# Patient Record
Sex: Female | Born: 1955 | Race: Black or African American | Hispanic: No | Smoking: Never smoker
Health system: Southern US, Community
[De-identification: ages and names within clinical notes are randomized; demographics above are authoritative.]

## PROBLEM LIST (undated history)

## (undated) DIAGNOSIS — E119 Type 2 diabetes mellitus without complications: Secondary | ICD-10-CM

## (undated) DIAGNOSIS — M1990 Unspecified osteoarthritis, unspecified site: Secondary | ICD-10-CM

## (undated) DIAGNOSIS — K219 Gastro-esophageal reflux disease without esophagitis: Secondary | ICD-10-CM

## (undated) DIAGNOSIS — C801 Malignant (primary) neoplasm, unspecified: Secondary | ICD-10-CM

## (undated) DIAGNOSIS — R0602 Shortness of breath: Secondary | ICD-10-CM

## (undated) DIAGNOSIS — F32A Depression, unspecified: Secondary | ICD-10-CM

## (undated) DIAGNOSIS — I10 Essential (primary) hypertension: Secondary | ICD-10-CM

## (undated) DIAGNOSIS — F319 Bipolar disorder, unspecified: Secondary | ICD-10-CM

## (undated) DIAGNOSIS — Z9289 Personal history of other medical treatment: Secondary | ICD-10-CM

## (undated) DIAGNOSIS — F329 Major depressive disorder, single episode, unspecified: Secondary | ICD-10-CM

## (undated) HISTORY — PX: WRIST SURGERY: SHX841

## (undated) HISTORY — PX: KNEE ARTHROPLASTY: SHX992

## (undated) HISTORY — PX: CHOLECYSTECTOMY: SHX55

## (undated) HISTORY — PX: BACK SURGERY: SHX140

## (undated) HISTORY — PX: THYROID SURGERY: SHX805

## (undated) HISTORY — PX: KNEE ARTHROSCOPY: SHX127

## (undated) HISTORY — PX: BREAST SURGERY: SHX581

## (undated) HISTORY — PX: ECTOPIC PREGNANCY SURGERY: SHX613

---

## 2001-12-19 DIAGNOSIS — I10 Essential (primary) hypertension: Secondary | ICD-10-CM | POA: Insufficient documentation

## 2005-01-03 HISTORY — PX: JOINT REPLACEMENT: SHX530

## 2005-08-11 ENCOUNTER — Emergency Department: Payer: Self-pay | Admitting: Emergency Medicine

## 2007-11-16 ENCOUNTER — Ambulatory Visit: Payer: Self-pay | Admitting: Internal Medicine

## 2008-01-27 ENCOUNTER — Ambulatory Visit: Payer: Self-pay | Admitting: Family Medicine

## 2010-10-08 DIAGNOSIS — F259 Schizoaffective disorder, unspecified: Secondary | ICD-10-CM | POA: Insufficient documentation

## 2011-04-04 DIAGNOSIS — E119 Type 2 diabetes mellitus without complications: Secondary | ICD-10-CM | POA: Insufficient documentation

## 2011-09-21 ENCOUNTER — Ambulatory Visit: Payer: Self-pay | Admitting: Family Medicine

## 2012-05-10 ENCOUNTER — Ambulatory Visit: Payer: Self-pay | Admitting: Pain Medicine

## 2012-05-21 DIAGNOSIS — B351 Tinea unguium: Secondary | ICD-10-CM | POA: Insufficient documentation

## 2012-05-23 ENCOUNTER — Ambulatory Visit: Payer: Self-pay | Admitting: Pain Medicine

## 2012-06-06 ENCOUNTER — Emergency Department: Payer: Self-pay | Admitting: Emergency Medicine

## 2012-06-13 DIAGNOSIS — M810 Age-related osteoporosis without current pathological fracture: Secondary | ICD-10-CM | POA: Insufficient documentation

## 2012-06-19 ENCOUNTER — Ambulatory Visit: Payer: Self-pay | Admitting: Pain Medicine

## 2012-06-20 ENCOUNTER — Other Ambulatory Visit: Payer: Self-pay | Admitting: Neurosurgery

## 2012-06-25 ENCOUNTER — Encounter (HOSPITAL_COMMUNITY): Payer: Self-pay | Admitting: Respiratory Therapy

## 2012-06-28 ENCOUNTER — Encounter (HOSPITAL_COMMUNITY): Payer: Self-pay

## 2012-06-28 ENCOUNTER — Encounter (HOSPITAL_COMMUNITY)
Admission: RE | Admit: 2012-06-28 | Discharge: 2012-06-28 | Disposition: A | Discharge status: Home / Self Care | Payer: Medicare Other | Source: Ambulatory Visit | Attending: Neurosurgery | Admitting: Neurosurgery

## 2012-06-28 DIAGNOSIS — Z01818 Encounter for other preprocedural examination: Secondary | ICD-10-CM | POA: Insufficient documentation

## 2012-06-28 DIAGNOSIS — Z01810 Encounter for preprocedural cardiovascular examination: Secondary | ICD-10-CM | POA: Insufficient documentation

## 2012-06-28 DIAGNOSIS — Z01812 Encounter for preprocedural laboratory examination: Secondary | ICD-10-CM | POA: Insufficient documentation

## 2012-06-28 HISTORY — DX: Depression, unspecified: F32.A

## 2012-06-28 HISTORY — DX: Essential (primary) hypertension: I10

## 2012-06-28 HISTORY — DX: Type 2 diabetes mellitus without complications: E11.9

## 2012-06-28 HISTORY — DX: Personal history of other medical treatment: Z92.89

## 2012-06-28 HISTORY — DX: Gastro-esophageal reflux disease without esophagitis: K21.9

## 2012-06-28 HISTORY — DX: Major depressive disorder, single episode, unspecified: F32.9

## 2012-06-28 LAB — BASIC METABOLIC PANEL
Calcium: 9.5 mg/dL (ref 8.4–10.5)
Creatinine, Ser: 0.82 mg/dL (ref 0.50–1.10)
GFR calc Af Amer: 90 mL/min — ABNORMAL LOW (ref >90)

## 2012-06-28 LAB — SURGICAL PCR SCREEN: MRSA, PCR: NEGATIVE

## 2012-06-28 LAB — CBC
MCH: 29.3 pg (ref 26.0–34.0)
MCV: 88.9 fL (ref 78.0–100.0)
Platelets: 257 10*3/uL (ref 150–400)
RDW: 14.3 % (ref 11.5–15.5)

## 2012-06-28 NOTE — Progress Notes (Addendum)
PATIENT HAD QUESTIONS ABOUT SURGERY AND SHE CALLED OFFICE .  PATIENT WAS GIVEN APPOINTMENT Monday TO TALK WITH DR. Gerlene Fee, WILL NEED TO SIGN CONSENT DOS.  PATIENT STATED DR. KRITZER'S OFFICE DID INSTRUCT HER TO STOP ASPIRIN.

## 2012-06-28 NOTE — Pre-Procedure Instructions (Signed)
Ashlee Bruce  06/28/2012   Your procedure is scheduled on:    Tuesday  07/03/12   Report to [facility] at 945 AM.  Call this number if you have problems the morning of surgery: [phone]   Remember:   Do not eat food or drink liquids after midnight.   Take these medicines the morning of surgery with A SIP OF WATER  :     ZANTAC, OXYCODONE IF NEEDED    Do not wear jewelry, make-up or nail polish.  Do not wear lotions, powders, or perfumes. You may wear deodorant.  Do not shave 48 hours prior to surgery. Men may shave face and neck.  Do not bring valuables to the hospital.  [facility] is not responsible                   for any belongings or valuables.  Contacts, dentures or bridgework may not be worn into surgery.  Leave suitcase in the car. After surgery it may be brought to your room.  For patients admitted to the hospital, checkout time is 11:00 AM the day of  discharge.   Patients discharged the day of surgery will not be allowed to drive  home.  Name and phone number of your driver:   Special Instructions: Shower using CHG 2 nights before surgery and the night before surgery.  If you shower the day of surgery use CHG.  Use special wash - you have one bottle of CHG for all showers.  You should use approximately 1/3 of the bottle for each shower.   Please read over the following fact sheets that you were given: Pain Booklet, Coughing and Deep Breathing, MRSA Information and Surgical Site Infection Prevention

## 2012-07-02 MED ORDER — DEXAMETHASONE SODIUM PHOSPHATE 10 MG/ML IJ SOLN: 10.0000 mg | INTRAMUSCULAR | Status: DC

## 2012-07-02 MED ORDER — CEFAZOLIN SODIUM-DEXTROSE 2-3 GM-% IV SOLR: 2.0000 g | INTRAVENOUS | Status: DC

## 2012-07-03 ENCOUNTER — Encounter (HOSPITAL_COMMUNITY): Payer: Self-pay | Admitting: Anesthesiology

## 2012-07-03 ENCOUNTER — Inpatient Hospital Stay (HOSPITAL_COMMUNITY): Admission: RE | Admit: 2012-07-03 | Payer: Medicare Other | Source: Ambulatory Visit | Admitting: Neurosurgery

## 2012-07-03 ENCOUNTER — Encounter (HOSPITAL_COMMUNITY): Admission: RE | Payer: Self-pay | Source: Ambulatory Visit

## 2012-07-03 SURGERY — ANTERIOR CERVICAL DECOMPRESSION/DISCECTOMY FUSION 2 LEVELS
Anesthesia: General

## 2012-07-03 NOTE — Preoperative (Signed)
Beta Blockers   Reason not to administer Beta Blockers:Not Applicable 

## 2012-07-12 ENCOUNTER — Other Ambulatory Visit: Payer: Self-pay | Admitting: Neurosurgery

## 2012-07-16 ENCOUNTER — Encounter (HOSPITAL_COMMUNITY): Payer: Self-pay | Admitting: Pharmacy Technician

## 2012-07-19 ENCOUNTER — Encounter (HOSPITAL_COMMUNITY)
Admission: RE | Admit: 2012-07-19 | Discharge: 2012-07-19 | Disposition: A | Discharge status: Home / Self Care | Payer: Medicare Other | Source: Ambulatory Visit | Attending: Neurosurgery | Admitting: Neurosurgery

## 2012-07-19 ENCOUNTER — Other Ambulatory Visit (HOSPITAL_COMMUNITY): Payer: Medicare Other

## 2012-07-19 ENCOUNTER — Ambulatory Visit: Payer: Self-pay | Admitting: Pain Medicine

## 2012-07-19 DIAGNOSIS — Z01818 Encounter for other preprocedural examination: Secondary | ICD-10-CM | POA: Insufficient documentation

## 2012-07-19 DIAGNOSIS — Z01812 Encounter for preprocedural laboratory examination: Secondary | ICD-10-CM | POA: Insufficient documentation

## 2012-07-19 LAB — CBC
Hemoglobin: 13.4 g/dL (ref 12.0–15.0)
MCH: 28.8 pg (ref 26.0–34.0)
MCHC: 32.1 g/dL (ref 30.0–36.0)
MCV: 89.7 fL (ref 78.0–100.0)
Platelets: 274 10*3/uL (ref 150–400)

## 2012-07-19 LAB — BASIC METABOLIC PANEL
Calcium: 9.8 mg/dL (ref 8.4–10.5)
Creatinine, Ser: 0.79 mg/dL (ref 0.50–1.10)
GFR calc non Af Amer: 89 mL/min — ABNORMAL LOW (ref >90)
Glucose, Bld: 73 mg/dL (ref 70–99)
Sodium: 141 mEq/L (ref 135–145)

## 2012-07-19 NOTE — Pre-Procedure Instructions (Signed)
Ashlee Bruce  07/19/2012   Your procedure is scheduled on:  07/26/12  Report to [facility] at 930 AM.  Call this number if you have problems the morning of surgery: [phone]   Remember:   Do not eat food or drink liquids after midnight.   Take these medicines the morning of surgery with A SIP OF WATER: oxycodone,zantac   Do not wear jewelry, make-up or nail polish.  Do not wear lotions, powders, or perfumes. You may wear deodorant.  Do not shave 48 hours prior to surgery. Men may shave face and neck.  Do not bring valuables to the hospital.  [facility] is not responsible                   for any belongings or valuables.  Contacts, dentures or bridgework may not be worn into surgery.  Leave suitcase in the car. After surgery it may be brought to your room.  For patients admitted to the hospital, checkout time is 11:00 AM the day of  discharge.   Patients discharged the day of surgery will not be allowed to drive  home.  Name and phone number of your driver: family  Special Instructions: Incentive Spirometry - Practice and bring it with you on the day of surgery.   Please read over the following fact sheets that you were given: Pain Booklet, Coughing and Deep Breathing, MRSA Information and Surgical Site Infection Prevention

## 2012-07-25 MED ORDER — CEFAZOLIN SODIUM-DEXTROSE 2-3 GM-% IV SOLR
2.0000 g | INTRAVENOUS | Status: AC
  Administered 2012-07-26: 2 g via INTRAVENOUS
  Filled 2012-07-25: qty 50

## 2012-07-26 ENCOUNTER — Inpatient Hospital Stay (HOSPITAL_COMMUNITY): Payer: Medicare Other

## 2012-07-26 ENCOUNTER — Inpatient Hospital Stay (HOSPITAL_COMMUNITY)
Admission: RE | Admit: 2012-07-26 | Discharge: 2012-07-27 | DRG: 473 | Disposition: A | Discharge status: Home / Self Care | Payer: Medicare Other | Source: Ambulatory Visit | Attending: Neurosurgery | Admitting: Neurosurgery

## 2012-07-26 ENCOUNTER — Inpatient Hospital Stay (HOSPITAL_COMMUNITY): Payer: Medicare Other | Admitting: Anesthesiology

## 2012-07-26 ENCOUNTER — Encounter (HOSPITAL_COMMUNITY)
Admission: RE | Disposition: A | Discharge status: Home / Self Care | Payer: Self-pay | Source: Ambulatory Visit | Attending: Neurosurgery

## 2012-07-26 ENCOUNTER — Encounter (HOSPITAL_COMMUNITY): Payer: Self-pay | Admitting: Surgery

## 2012-07-26 ENCOUNTER — Encounter (HOSPITAL_COMMUNITY): Payer: Self-pay | Admitting: Anesthesiology

## 2012-07-26 DIAGNOSIS — Z96659 Presence of unspecified artificial knee joint: Secondary | ICD-10-CM

## 2012-07-26 DIAGNOSIS — F3289 Other specified depressive episodes: Secondary | ICD-10-CM | POA: Diagnosis present

## 2012-07-26 DIAGNOSIS — M5020 Other cervical disc displacement, unspecified cervical region: Secondary | ICD-10-CM

## 2012-07-26 DIAGNOSIS — F329 Major depressive disorder, single episode, unspecified: Secondary | ICD-10-CM | POA: Diagnosis present

## 2012-07-26 DIAGNOSIS — K219 Gastro-esophageal reflux disease without esophagitis: Secondary | ICD-10-CM | POA: Diagnosis present

## 2012-07-26 DIAGNOSIS — I10 Essential (primary) hypertension: Secondary | ICD-10-CM | POA: Diagnosis present

## 2012-07-26 DIAGNOSIS — Z79899 Other long term (current) drug therapy: Secondary | ICD-10-CM

## 2012-07-26 DIAGNOSIS — E119 Type 2 diabetes mellitus without complications: Secondary | ICD-10-CM | POA: Diagnosis present

## 2012-07-26 DIAGNOSIS — Z9089 Acquired absence of other organs: Secondary | ICD-10-CM

## 2012-07-26 HISTORY — PX: ANTERIOR CERVICAL DECOMP/DISCECTOMY FUSION: SHX1161

## 2012-07-26 LAB — GLUCOSE, CAPILLARY
Glucose-Capillary: 121 mg/dL — ABNORMAL HIGH (ref 70–99)
Glucose-Capillary: 174 mg/dL — ABNORMAL HIGH (ref 70–99)
Glucose-Capillary: 126 mg/dL — ABNORMAL HIGH (ref 70–99)

## 2012-07-26 SURGERY — ANTERIOR CERVICAL DECOMPRESSION/DISCECTOMY FUSION 2 LEVELS
Anesthesia: General | Wound class: Dirty or Infected

## 2012-07-26 MED ORDER — SODIUM CHLORIDE 0.9 % IV SOLN
INTRAVENOUS | Status: AC
  Filled 2012-07-26: qty 500

## 2012-07-26 MED ORDER — INSULIN ASPART 100 UNIT/ML ~~LOC~~ SOLN: 0.0000 [IU] | Freq: Three times a day (TID) | SUBCUTANEOUS | Status: DC

## 2012-07-26 MED ORDER — PHENOL 1.4 % MT LIQD: 1.0000 | OROMUCOSAL | Status: DC | PRN

## 2012-07-26 MED ORDER — SODIUM CHLORIDE 0.9 % IJ SOLN: 3.0000 mL | INTRAMUSCULAR | Status: DC | PRN

## 2012-07-26 MED ORDER — NEOSTIGMINE METHYLSULFATE 1 MG/ML IJ SOLN
INTRAMUSCULAR | Status: DC | PRN
  Administered 2012-07-26: 3 mg via INTRAVENOUS

## 2012-07-26 MED ORDER — OXYCODONE HCL 5 MG PO TABS
5.0000 mg | ORAL_TABLET | Freq: Once | ORAL | Status: AC | PRN
  Administered 2012-07-26: 5 mg via ORAL

## 2012-07-26 MED ORDER — QUINAPRIL HCL 10 MG PO TABS
20.0000 mg | ORAL_TABLET | ORAL | Status: AC
  Administered 2012-07-26: 20 mg via ORAL
  Filled 2012-07-26: qty 2

## 2012-07-26 MED ORDER — 0.9 % SODIUM CHLORIDE (POUR BTL) OPTIME
TOPICAL | Status: DC | PRN
  Administered 2012-07-26: 1000 mL

## 2012-07-26 MED ORDER — BACITRACIN 50000 UNITS IM SOLR
INTRAMUSCULAR | Status: AC
  Filled 2012-07-26: qty 1

## 2012-07-26 MED ORDER — ACETAMINOPHEN 650 MG RE SUPP: 650.0000 mg | RECTAL | Status: DC | PRN

## 2012-07-26 MED ORDER — LACTATED RINGERS IV SOLN
INTRAVENOUS | Status: DC | PRN
  Administered 2012-07-26 (×2): via INTRAVENOUS

## 2012-07-26 MED ORDER — HYDROMORPHONE HCL PF 1 MG/ML IJ SOLN
1.0000 mg | INTRAMUSCULAR | Status: DC | PRN
  Administered 2012-07-26: 1.5 mg via INTRAMUSCULAR
  Administered 2012-07-27 (×4): 1 mg via INTRAMUSCULAR
  Filled 2012-07-26 (×3): qty 1
  Filled 2012-07-26: qty 2
  Filled 2012-07-26: qty 1

## 2012-07-26 MED ORDER — OXYCODONE HCL 5 MG/5ML PO SOLN: 5.0000 mg | Freq: Once | ORAL | Status: AC | PRN

## 2012-07-26 MED ORDER — HYDROMORPHONE HCL PF 1 MG/ML IJ SOLN
INTRAMUSCULAR | Status: AC
  Filled 2012-07-26: qty 1

## 2012-07-26 MED ORDER — POTASSIUM CHLORIDE IN NACL 20-0.45 MEQ/L-% IV SOLN
INTRAVENOUS | Status: DC
  Filled 2012-07-26 (×4): qty 1000

## 2012-07-26 MED ORDER — SIMVASTATIN 5 MG PO TABS
5.0000 mg | ORAL_TABLET | Freq: Every day | ORAL | Status: DC
  Administered 2012-07-26: 5 mg via ORAL
  Filled 2012-07-26 (×2): qty 1

## 2012-07-26 MED ORDER — SODIUM CHLORIDE 0.9 % IJ SOLN
3.0000 mL | Freq: Two times a day (BID) | INTRAMUSCULAR | Status: DC
  Administered 2012-07-26: 3 mL via INTRAVENOUS

## 2012-07-26 MED ORDER — ONDANSETRON HCL 4 MG/2ML IJ SOLN
4.0000 mg | INTRAMUSCULAR | Status: DC | PRN
  Filled 2012-07-26: qty 2

## 2012-07-26 MED ORDER — FENTANYL CITRATE 0.05 MG/ML IJ SOLN
INTRAMUSCULAR | Status: DC | PRN
  Administered 2012-07-26: 50 ug via INTRAVENOUS
  Administered 2012-07-26: 100 ug via INTRAVENOUS
  Administered 2012-07-26: 50 ug via INTRAVENOUS
  Administered 2012-07-26: 100 ug via INTRAVENOUS

## 2012-07-26 MED ORDER — HYDROCHLOROTHIAZIDE 25 MG PO TABS
25.0000 mg | ORAL_TABLET | Freq: Every day | ORAL | Status: DC
  Administered 2012-07-27: 25 mg via ORAL
  Filled 2012-07-26: qty 1

## 2012-07-26 MED ORDER — PROMETHAZINE HCL 25 MG/ML IJ SOLN: 6.2500 mg | INTRAMUSCULAR | Status: DC | PRN

## 2012-07-26 MED ORDER — LACTATED RINGERS IV SOLN: INTRAVENOUS | Status: DC

## 2012-07-26 MED ORDER — HYDROCHLOROTHIAZIDE 25 MG PO TABS
25.0000 mg | ORAL_TABLET | ORAL | Status: AC
  Administered 2012-07-26: 25 mg via ORAL
  Filled 2012-07-26: qty 1

## 2012-07-26 MED ORDER — THROMBIN 5000 UNITS EX SOLR
CUTANEOUS | Status: DC | PRN
  Administered 2012-07-26 (×2): 5000 [IU] via TOPICAL

## 2012-07-26 MED ORDER — PANTOPRAZOLE SODIUM 40 MG IV SOLR
40.0000 mg | Freq: Every day | INTRAVENOUS | Status: DC
  Administered 2012-07-26: 40 mg via INTRAVENOUS
  Filled 2012-07-26 (×2): qty 40

## 2012-07-26 MED ORDER — ROCURONIUM BROMIDE 100 MG/10ML IV SOLN
INTRAVENOUS | Status: DC | PRN
  Administered 2012-07-26: 50 mg via INTRAVENOUS
  Administered 2012-07-26: 10 mg via INTRAVENOUS

## 2012-07-26 MED ORDER — THROMBIN 5000 UNITS EX SOLR
OROMUCOSAL | Status: DC | PRN
  Administered 2012-07-26: 13:00:00 via TOPICAL

## 2012-07-26 MED ORDER — SODIUM CHLORIDE 0.9 % IR SOLN
Status: DC | PRN
  Administered 2012-07-26: 12:00:00

## 2012-07-26 MED ORDER — OXYCODONE HCL 5 MG PO TABS
ORAL_TABLET | ORAL | Status: AC
  Filled 2012-07-26: qty 1

## 2012-07-26 MED ORDER — HEMOSTATIC AGENTS (NO CHARGE) OPTIME
TOPICAL | Status: DC | PRN
  Administered 2012-07-26: 1 via TOPICAL

## 2012-07-26 MED ORDER — CEFAZOLIN SODIUM 1-5 GM-% IV SOLN
1.0000 g | Freq: Three times a day (TID) | INTRAVENOUS | Status: AC
  Administered 2012-07-26 – 2012-07-27 (×2): 1 g via INTRAVENOUS
  Filled 2012-07-26 (×2): qty 50

## 2012-07-26 MED ORDER — PROPOFOL 10 MG/ML IV BOLUS
INTRAVENOUS | Status: DC | PRN
  Administered 2012-07-26: 170 mg via INTRAVENOUS

## 2012-07-26 MED ORDER — HYDROMORPHONE HCL PF 1 MG/ML IJ SOLN
0.2500 mg | INTRAMUSCULAR | Status: DC | PRN
  Administered 2012-07-26 (×3): 0.5 mg via INTRAVENOUS

## 2012-07-26 MED ORDER — MENTHOL 3 MG MT LOZG: 1.0000 | LOZENGE | OROMUCOSAL | Status: DC | PRN

## 2012-07-26 MED ORDER — ACETAMINOPHEN 325 MG PO TABS: 650.0000 mg | ORAL_TABLET | ORAL | Status: DC | PRN

## 2012-07-26 MED ORDER — GLYCOPYRROLATE 0.2 MG/ML IJ SOLN
INTRAMUSCULAR | Status: DC | PRN
  Administered 2012-07-26: 0.6 mg via INTRAVENOUS

## 2012-07-26 MED ORDER — QUINAPRIL HCL 10 MG PO TABS
20.0000 mg | ORAL_TABLET | Freq: Every day | ORAL | Status: DC
  Filled 2012-07-26: qty 2

## 2012-07-26 MED ORDER — MIDAZOLAM HCL 5 MG/5ML IJ SOLN
INTRAMUSCULAR | Status: DC | PRN
  Administered 2012-07-26: 2 mg via INTRAVENOUS

## 2012-07-26 MED ORDER — INSULIN ASPART 100 UNIT/ML ~~LOC~~ SOLN: 0.0000 [IU] | Freq: Every day | SUBCUTANEOUS | Status: DC

## 2012-07-26 MED ORDER — INSULIN ASPART 100 UNIT/ML ~~LOC~~ SOLN: 4.0000 [IU] | Freq: Three times a day (TID) | SUBCUTANEOUS | Status: DC

## 2012-07-26 MED ORDER — METFORMIN HCL 500 MG PO TABS
1000.0000 mg | ORAL_TABLET | Freq: Every day | ORAL | Status: DC
  Administered 2012-07-27: 1000 mg via ORAL
  Filled 2012-07-26 (×2): qty 2

## 2012-07-26 MED ORDER — LIDOCAINE HCL (CARDIAC) 20 MG/ML IV SOLN
INTRAVENOUS | Status: DC | PRN
  Administered 2012-07-26: 50 mg via INTRAVENOUS

## 2012-07-26 MED ORDER — CYCLOBENZAPRINE HCL 10 MG PO TABS
10.0000 mg | ORAL_TABLET | Freq: Three times a day (TID) | ORAL | Status: DC | PRN
  Administered 2012-07-26: 10 mg via ORAL
  Filled 2012-07-26 (×2): qty 1

## 2012-07-26 MED ORDER — ONDANSETRON HCL 4 MG/2ML IJ SOLN
INTRAMUSCULAR | Status: DC | PRN
  Administered 2012-07-26: 4 mg via INTRAVENOUS

## 2012-07-26 MED ORDER — SODIUM CHLORIDE 0.9 % IV SOLN: 250.0000 mL | INTRAVENOUS | Status: DC

## 2012-07-26 MED ORDER — OXYCODONE-ACETAMINOPHEN 5-325 MG PO TABS
1.0000 | ORAL_TABLET | ORAL | Status: DC | PRN
  Filled 2012-07-26 (×2): qty 2

## 2012-07-26 SURGICAL SUPPLY — 65 items
BAG DECANTER FOR FLEXI CONT (MISCELLANEOUS) ×2 IMPLANT
BENZOIN TINCTURE PRP APPL 2/3 (GAUZE/BANDAGES/DRESSINGS) ×2 IMPLANT
BIT DRILL INVIZIA (BIT) ×1 IMPLANT
BRUSH SCRUB EZ PLAIN DRY (MISCELLANEOUS) ×2 IMPLANT
BUR MATCHSTICK NEURO 3.0 LAGG (BURR) ×2 IMPLANT
CANISTER SUCTION 2500CC (MISCELLANEOUS) ×2 IMPLANT
CLOTH BEACON ORANGE TIMEOUT ST (SAFETY) ×2 IMPLANT
CONT SPEC 4OZ CLIKSEAL STRL BL (MISCELLANEOUS) ×2 IMPLANT
DERMABOND ADVANCED (GAUZE/BANDAGES/DRESSINGS) ×1
DERMABOND ADVANCED .7 DNX12 (GAUZE/BANDAGES/DRESSINGS) ×1 IMPLANT
DRAPE C-ARM 42X72 X-RAY (DRAPES) ×4 IMPLANT
DRAPE LAPAROTOMY 100X72 PEDS (DRAPES) ×2 IMPLANT
DRAPE MICROSCOPE ZEISS OPMI (DRAPES) ×2 IMPLANT
DRAPE SURG 17X23 STRL (DRAPES) ×4 IMPLANT
DRESSING TELFA 8X3 (GAUZE/BANDAGES/DRESSINGS) ×2 IMPLANT
DRILL BIT INVIZIA (BIT) ×2
DURAPREP 6ML APPLICATOR 50/CS (WOUND CARE) ×2 IMPLANT
ELECT COATED BLADE 2.86 ST (ELECTRODE) ×2 IMPLANT
ELECT REM PT RETURN 9FT ADLT (ELECTROSURGICAL) ×2
ELECTRODE REM PT RTRN 9FT ADLT (ELECTROSURGICAL) ×1 IMPLANT
GAUZE SPONGE 4X4 16PLY XRAY LF (GAUZE/BANDAGES/DRESSINGS) IMPLANT
GLOVE BIO SURGEON STRL SZ8 (GLOVE) ×2 IMPLANT
GLOVE BIOGEL PI IND STRL 7.0 (GLOVE) ×1 IMPLANT
GLOVE BIOGEL PI IND STRL 7.5 (GLOVE) ×1 IMPLANT
GLOVE BIOGEL PI INDICATOR 7.0 (GLOVE) ×1
GLOVE BIOGEL PI INDICATOR 7.5 (GLOVE) ×1
GLOVE ECLIPSE 7.5 STRL STRAW (GLOVE) ×6 IMPLANT
GLOVE EXAM NITRILE LRG STRL (GLOVE) IMPLANT
GLOVE EXAM NITRILE MD LF STRL (GLOVE) ×2 IMPLANT
GLOVE EXAM NITRILE XL STR (GLOVE) IMPLANT
GLOVE EXAM NITRILE XS STR PU (GLOVE) IMPLANT
GLOVE SS BIOGEL STRL SZ 6.5 (GLOVE) ×2 IMPLANT
GLOVE SS BIOGEL STRL SZ 8 (GLOVE) ×1 IMPLANT
GLOVE SUPERSENSE BIOGEL SZ 6.5 (GLOVE) ×2
GLOVE SUPERSENSE BIOGEL SZ 8 (GLOVE) ×1
GOWN BRE IMP SLV AUR LG STRL (GOWN DISPOSABLE) ×2 IMPLANT
GOWN BRE IMP SLV AUR XL STRL (GOWN DISPOSABLE) ×6 IMPLANT
GOWN STRL REIN 2XL LVL4 (GOWN DISPOSABLE) IMPLANT
HEAD HALTER (SOFTGOODS) ×2 IMPLANT
HEMOSTAT POWDER SURGIFOAM 1G (HEMOSTASIS) ×2 IMPLANT
INTERBODY TM 11X14X7-7DEG ANG (Metal Cage) ×2 IMPLANT
INTERBODY TM 11X14X8-7DEG ANG (Metal Cage) ×2 IMPLANT
KIT BASIN OR (CUSTOM PROCEDURE TRAY) ×2 IMPLANT
KIT ROOM TURNOVER OR (KITS) ×2 IMPLANT
NEEDLE SPNL 20GX3.5 QUINCKE YW (NEEDLE) ×2 IMPLANT
NS IRRIG 1000ML POUR BTL (IV SOLUTION) ×2 IMPLANT
PACK LAMINECTOMY NEURO (CUSTOM PROCEDURE TRAY) ×2 IMPLANT
PAD ARMBOARD 7.5X6 YLW CONV (MISCELLANEOUS) ×2 IMPLANT
PATTIES SURGICAL .75X.75 (GAUZE/BANDAGES/DRESSINGS) ×2 IMPLANT
PLATE INVIZIA 2 LEV 42MM (Plate) ×2 IMPLANT
PUTTY BONE GRAFT KIT 2.5ML (Bone Implant) ×2 IMPLANT
RUBBERBAND STERILE (MISCELLANEOUS) ×4 IMPLANT
SCREW SD FIXED 12MM (Screw) ×12 IMPLANT
SPONGE GAUZE 4X4 12PLY (GAUZE/BANDAGES/DRESSINGS) ×2 IMPLANT
SPONGE INTESTINAL PEANUT (DISPOSABLE) ×2 IMPLANT
SPONGE SURGIFOAM ABS GEL SZ50 (HEMOSTASIS) ×2 IMPLANT
STRIP CLOSURE SKIN 1/2X4 (GAUZE/BANDAGES/DRESSINGS) ×2 IMPLANT
SUT PDS AB 5-0 P3 18 (SUTURE) ×2 IMPLANT
SUT VIC AB 3-0 CP2 18 (SUTURE) ×4 IMPLANT
SYR 20ML ECCENTRIC (SYRINGE) IMPLANT
TAPE CLOTH SURG 4X10 WHT LF (GAUZE/BANDAGES/DRESSINGS) ×2 IMPLANT
TOWEL OR 17X24 6PK STRL BLUE (TOWEL DISPOSABLE) ×2 IMPLANT
TOWEL OR 17X26 10 PK STRL BLUE (TOWEL DISPOSABLE) ×2 IMPLANT
TRAP SPECIMEN MUCOUS 40CC (MISCELLANEOUS) ×2 IMPLANT
WATER STERILE IRR 1000ML POUR (IV SOLUTION) ×2 IMPLANT

## 2012-07-26 NOTE — OR Nursing (Signed)
Dr Dia Sitter aware of bp down slightly and that she has been sleeping / also has had a dose of accupril and hctz she sis not take before surgery---> per him she can transfer to floor

## 2012-07-26 NOTE — Preoperative (Signed)
Beta Blockers   Reason not to administer Beta Blockers:Not Applicable 

## 2012-07-26 NOTE — H&P (Signed)
Ashlee Bruce is an 57 y.o. female.   Chief Complaint: Right upper extremity pain HPI: The patient is a 57 year old female who was evaluated in the office for neck and right arm pain. She was tried on conservative therapy which gave her no significant improvement. She went imaging studies which show significant disc abnormalities at C5-6 and even worse at C6-7. After finding failing additional conservative therapy the patient requested surgery now comes for a two-level anterior cervical discectomy fusion and plating. Of note is the fact the patient is actually on for surgery about a weeks ago interested natural for surgery because she got cold feet. I've had a long discussion with her regarding the risks and benefits of surgical intervention. The risks discussed include but are not limited to bleeding infection weakness numbness paralysis: Instrumentation nonunion coma hoarseness and death. We have discussed alternative methods of therapy offered risks and benefits of nonintervention. She's had the opportunity to ask numerous questions and appears to understand. With this information in hand she has requested we proceed with surgical intervention.  Past Medical History  Diagnosis Date  . Hypertension   . Depression   . Diabetes mellitus without complication   . GERD (gastroesophageal reflux disease)   . Transfusion history     Past Surgical History  Procedure Laterality Date  . Cholecystectomy    . Breast surgery      LEFT   PRECANCER CELLS REMOVED   2009    . Knee arthroplasty      RIGHT  2007     . Thyroid surgery      NODULE REMOVED   1989   . Ectopic pregnancy surgery      TUBE REMOVED  . Wrist surgery      LEFT     2010    History reviewed. No pertinent family history. Social History:  reports that she has never smoked. She does not have any smokeless tobacco history on file. She reports that she does not drink alcohol or use illicit drugs.  Allergies: No Known  Allergies  Medications Prior to Admission  Medication Sig Dispense Refill  . aspirin 81 MG tablet Take 81 mg by mouth daily.      . cholecalciferol (VITAMIN D) 1000 UNITS tablet Take 1,000 Units by mouth daily.      . hydrochlorothiazide (HYDRODIURIL) 25 MG tablet Take 25 mg by mouth daily.      Marland Kitchen lovastatin (MEVACOR) 10 MG tablet Take 10 mg by mouth at bedtime.      . metFORMIN (GLUCOPHAGE) 1000 MG tablet Take 1,000 mg by mouth daily with breakfast.      . Oxycodone HCl 10 MG TABS Take 10 mg by mouth every 6 (six) hours as needed (for pain).      . quinapril (ACCUPRIL) 20 MG tablet Take 20 mg by mouth at bedtime.      . ranitidine (ZANTAC) 150 MG tablet Take 150 mg by mouth 2 (two) times daily.      . traMADol (ULTRAM) 50 MG tablet Take 50 mg by mouth every 6 (six) hours as needed for pain.        Results for orders placed during the hospital encounter of 07/26/12 (from the past 48 hour(s))  GLUCOSE, CAPILLARY     Status: Abnormal   Collection Time    07/26/12  9:44 AM      Result Value Range   Glucose-Capillary 129 (*) 70 - 99 mg/dL   No results found.  Review  of systems not obtained due to patient factors.  Blood pressure 157/84, temperature 97.8 F (36.6 C), resp. rate 20, SpO2 97.00%.  The patient is awake or and oriented. She is no facial asymmetry. Her gait is nonantalgic. Reflexes are 1+ and equal. She has mild weakness of the right tricep muscle her sensation is intact Assessment/Plan Impression is that of disc herniations at C5-6 and C6-7 with right-sided nerve root compression. The plan is for a two-level anterior cervical discectomy with fusion and plating.  Reinaldo Meeker, MD 07/26/2012, 10:49 AM

## 2012-07-26 NOTE — Op Note (Signed)
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 Localizing fluoroscopy was used prior to incision to identify the appropriate level. Transverse incision was made in the right anterior neck started midline and headed towards the medial aspect of the sternocleidomastoid muscle. The platysma muscle was incised transversely and the natural fascial plane between the strap muscles medially and the sternocleidomastoid laterally was identified and followed down to the anterior aspect the cervical spine. Longus Cole muscles were identified split in the midline to play bilaterally with Kitner section and unipolar coagulation. Self-retaining tract was placed for exposure and x-ray showed approach the appropriate levels. Using a 15 blade the [place] disc at C5-6 and C6-7 was incised. Using pituitary rongeurs and curettes approximately 90% of the disc material was removed at both levels. High-speed drill was used to widen the interspace as and bony shavings were saved for use later in the case. At this time the microscope was draped brought into the field and used for the remainder of the case. Starting at C5-6 the remainder of the disc material down the posterior longitudinal ligament was removed. The was then incised transversely and the cut edges removed a Kerrison punch. Large amounts of bony overgrowth and herniated disc material were identified in the right paramedian region these were removed without difficulty. Generous foraminal decompression was carried out of the C6 nerve roots were well visualized well decompressed. Similar decompression was then  carried out at C6-7 were similar pathology was encountered. Herniated disc towards the right side was identified the C7 nerve roots well visualized well decompressed we are completed. At this time inspection was carried out of both levels for any evidence of residual compression and none could be identified. Irrigation was carried out and any bleeding controlled with upper coagulation Gelfoam and Surgifoam. Measurements were taken and a 7 and an 8 mm lordotic graft were chosen. The canal was filled with a mixture of autologous bone morselized allograft in the spacers were impacted at difficulty. Proximal we showed to be in good position. An appropriately length danger cervical plate was then chosen. Under fluoroscopic guidance drill holes were placed followed by placing of 12 mm screws x6. Locking mechanism was rotated locked position and fossae showed good positioning of the plates screws and plugs. Irrigation was carried out and any bleeding control proper coagulation. The was then closed with inverted Vicryl on the opposite is muscle in the subcuticular layer and Steri-Strips were placed on the skin. Shortness was then applied the patient was extubated and taken to cut them in stable condition.

## 2012-07-26 NOTE — Transfer of Care (Signed)
Immediate Anesthesia Transfer of Care Note  Patient: Ashlee Bruce  Procedure(s) Performed: Procedure(s) with comments: ANTERIOR CERVICAL DECOMPRESSION/DISCECTOMY FUSION 2 LEVELS (N/A) - Cervical Five-Six Cervical Six-Seven  Anterior cervical decompression/diskectomy/fusion/plate (class 4- fly in room)  Patient Location: PACU  Anesthesia Type:General  Level of Consciousness: awake and alert   Airway & Oxygen Therapy: Patient Spontanous Breathing and Patient connected to nasal cannula oxygen  Post-op Assessment: Report given to PACU RN and Post -op Vital signs reviewed and stable  Post vital signs: Reviewed and stable  Complications: No apparent anesthesia complications

## 2012-07-26 NOTE — Progress Notes (Signed)
Dr.Massagee called r/t pt's elevated BP. Orders received to cont to treat pain at this time and reasses BP when pain is better.

## 2012-07-26 NOTE — Anesthesia Preprocedure Evaluation (Addendum)
Anesthesia Evaluation  Patient identified by MRN, date of birth, ID band Patient awake    Reviewed: Allergy & Precautions, H&P , NPO status , Patient's Chart, lab work & pertinent test results  Airway Mallampati: I  Neck ROM: Full    Dental  (+) Teeth Intact and Caps   Pulmonary neg pulmonary ROS,  breath sounds clear to auscultation        Cardiovascular hypertension, Rhythm:Regular Rate:Normal     Neuro/Psych negative neurological ROS     GI/Hepatic GERD-  ,  Endo/Other  diabetes  Renal/GU      Musculoskeletal   Abdominal   Peds  Hematology   Anesthesia Other Findings   Reproductive/Obstetrics                          Anesthesia Physical Anesthesia Plan  ASA: III  Anesthesia Plan: General   Post-op Pain Management:    Induction: Intravenous  Airway Management Planned: Oral ETT  Additional Equipment:   Intra-op Plan:   Post-operative Plan: Extubation in OR  Informed Consent:   Dental advisory given  Plan Discussed with: CRNA and Surgeon  Anesthesia Plan Comments:        Anesthesia Quick Evaluation

## 2012-07-26 NOTE — Progress Notes (Signed)
UR COMPLETED  

## 2012-07-26 NOTE — Anesthesia Postprocedure Evaluation (Signed)
  Anesthesia Post-op Note  Patient: Ashlee Bruce  Procedure(s) Performed: Procedure(s) with comments: ANTERIOR CERVICAL DECOMPRESSION/DISCECTOMY FUSION 2 LEVELS (N/A) - Cervical Five-Six Cervical Six-Seven  Anterior cervical decompression/diskectomy/fusion/plate (class 4- fly in room)  Patient Location: PACU  Anesthesia Type:General  Level of Consciousness: awake  Airway and Oxygen Therapy: Patient Spontanous Breathing  Post-op Pain: mild  Post-op Assessment: Post-op Vital signs reviewed  Post-op Vital Signs: stable  Complications: No apparent anesthesia complications

## 2012-07-27 LAB — GLUCOSE, CAPILLARY: Glucose-Capillary: 156 mg/dL — ABNORMAL HIGH (ref 70–99)

## 2012-07-27 MED ORDER — OXYCODONE HCL 10 MG PO TABS: 10.0000 mg | ORAL_TABLET | ORAL | Status: DC | PRN

## 2012-07-27 NOTE — Progress Notes (Signed)
Pt doing well. Pt given D/C instructions with Rx, verbal understanding given. Pt D/C'd home via wheelchair with family @ 1700 per MD order. Rema Fendt, RN

## 2012-07-27 NOTE — Discharge Summary (Signed)
Physician Discharge Summary  Patient ID: DETTA MELLIN MRN: [id number] DOB/AGE: 08-11-1955 57 y.o.  Admit date: 07/26/2012 Discharge date: 07/27/2012  Admission Diagnoses:  Discharge Diagnoses:  Active Problems:   * No active hospital problems. *   Discharged Condition: good  Hospital Course: Surgery one day for 2 level acdf. Did well No pain post op. Wound fine. Ambulated well. Home pod1, specific instructions given.  Consults: None  Significant Diagnostic Studies: none  Treatments: surgery: C56 C 67 acdf  Discharge Exam: Blood pressure 148/76, pulse 98, temperature 98.8 F (37.1 C), resp. rate 18, SpO2 93.00%. Incision/Wound:clean and dry; no new neuro issues  Disposition: Final discharge disposition not confirmed  Discharge Orders   Future Orders Complete By Expires     Call MD for:  difficulty breathing, headache or visual disturbances  As directed     Call MD for:  hives  As directed     Call MD for:  persistant nausea and vomiting  As directed     Call MD for:  redness, tenderness, or signs of infection (pain, swelling, redness, odor or green/yellow discharge around incision site)  As directed     Call MD for:  severe uncontrolled pain  As directed     Call MD for:  temperature >100.4  As directed     Diet general  As directed     Discharge instructions  As directed     Comments:      Mostly bedrest. Get up 9 or 10 times each day and walk for 15-20 minutes each time. Very little sitting the first week. No riding in the car until your first post op appointment. If you had neck surgery...may shower from the chest down. If you had low back surgery....you may shower with a saran wrap covering over the incision. Take your pain medicine as needed...and other medicines that you are instructed to take. Call for an appointment...[phone].        Medication List    STOP taking these medications       aspirin 81 MG tablet     traMADol 50 MG tablet  Commonly known as:   ULTRAM      TAKE these medications       cholecalciferol 1000 UNITS tablet  Commonly known as:  VITAMIN D  Take 1,000 Units by mouth daily.     hydrochlorothiazide 25 MG tablet  Commonly known as:  HYDRODIURIL  Take 25 mg by mouth daily.     lovastatin 10 MG tablet  Commonly known as:  MEVACOR  Take 10 mg by mouth at bedtime.     metFORMIN 1000 MG tablet  Commonly known as:  GLUCOPHAGE  Take 1,000 mg by mouth daily with breakfast.     Oxycodone HCl 10 MG Tabs  Commonly known as:  ROXICODONE  Take 1 tablet (10 mg total) by mouth every 4 (four) hours as needed for pain.     Oxycodone HCl 10 MG Tabs  Take 10 mg by mouth every 6 (six) hours as needed (for pain).     quinapril 20 MG tablet  Commonly known as:  ACCUPRIL  Take 20 mg by mouth at bedtime.     ranitidine 150 MG tablet  Commonly known as:  ZANTAC  Take 150 mg by mouth 2 (two) times daily.         At home rest most of the time. Get up 9 or 10 times each day and take a 15 or 20 minute  walk. No riding in the car and to your first postoperative appointment. If you have neck surgery you may shower from the chest down starting on the third postoperative day. If you had back surgery he may start showering on the third postoperative day with saran wrap wrapped around your incisional area 3 times. After the shower remove the saran wrap. Take pain medicine as needed and other medications as instructed. Call my office for an appointment.  SignedReinaldo Meeker, MD 07/27/2012, 11:12 AM

## 2012-07-30 ENCOUNTER — Encounter (HOSPITAL_COMMUNITY): Payer: Self-pay | Admitting: Neurosurgery

## 2012-08-16 ENCOUNTER — Ambulatory Visit: Payer: Self-pay | Admitting: Pain Medicine

## 2012-12-16 ENCOUNTER — Emergency Department: Payer: Self-pay | Admitting: Emergency Medicine

## 2013-01-14 ENCOUNTER — Ambulatory Visit: Payer: Self-pay | Admitting: Gastroenterology

## 2013-02-02 ENCOUNTER — Emergency Department: Payer: Self-pay | Admitting: Emergency Medicine

## 2013-03-27 ENCOUNTER — Other Ambulatory Visit: Payer: Self-pay | Admitting: Neurosurgery

## 2013-03-27 DIAGNOSIS — M549 Dorsalgia, unspecified: Secondary | ICD-10-CM

## 2013-04-01 ENCOUNTER — Ambulatory Visit
Admission: RE | Admit: 2013-04-01 | Discharge: 2013-04-01 | Disposition: A | Discharge status: Home / Self Care | Payer: Medicare Other | Source: Ambulatory Visit | Attending: Neurosurgery | Admitting: Neurosurgery

## 2013-04-01 VITALS — BP 124/87 | HR 71

## 2013-04-01 DIAGNOSIS — M549 Dorsalgia, unspecified: Secondary | ICD-10-CM

## 2013-04-01 MED ORDER — OXYCODONE-ACETAMINOPHEN 5-325 MG PO TABS
2.0000 | ORAL_TABLET | Freq: Once | ORAL | Status: AC
  Administered 2013-04-01: 2 via ORAL

## 2013-04-01 MED ORDER — ONDANSETRON HCL 8 MG PO TABS
8.0000 mg | ORAL_TABLET | Freq: Once | ORAL | Status: AC
  Administered 2013-04-01: 8 mg via ORAL

## 2013-04-01 MED ORDER — MEPERIDINE HCL 100 MG/ML IJ SOLN
75.0000 mg | Freq: Once | INTRAMUSCULAR | Status: AC
  Administered 2013-04-01: 75 mg via INTRAMUSCULAR

## 2013-04-01 MED ORDER — DIAZEPAM 5 MG PO TABS: 10.0000 mg | ORAL_TABLET | Freq: Once | ORAL | Status: DC

## 2013-04-01 MED ORDER — IOHEXOL 180 MG/ML  SOLN
15.0000 mL | Freq: Once | INTRAMUSCULAR | Status: AC | PRN
  Administered 2013-04-01: 15 mL via INTRATHECAL

## 2013-04-01 NOTE — Discharge Instructions (Signed)

## 2013-04-05 ENCOUNTER — Other Ambulatory Visit: Payer: Medicare Other

## 2013-04-15 ENCOUNTER — Other Ambulatory Visit: Payer: Self-pay | Admitting: Neurosurgery

## 2013-04-19 ENCOUNTER — Encounter (HOSPITAL_COMMUNITY): Payer: Self-pay

## 2013-04-25 ENCOUNTER — Encounter (HOSPITAL_COMMUNITY): Payer: Self-pay

## 2013-04-25 ENCOUNTER — Encounter (HOSPITAL_COMMUNITY)
Admission: RE | Admit: 2013-04-25 | Discharge: 2013-04-25 | Disposition: A | Discharge status: Home / Self Care | Payer: Medicare Other | Source: Ambulatory Visit | Attending: Neurosurgery | Admitting: Neurosurgery

## 2013-04-25 DIAGNOSIS — Z01812 Encounter for preprocedural laboratory examination: Secondary | ICD-10-CM | POA: Insufficient documentation

## 2013-04-25 HISTORY — DX: Shortness of breath: R06.02

## 2013-04-25 HISTORY — DX: Bipolar disorder, unspecified: F31.9

## 2013-04-25 LAB — SURGICAL PCR SCREEN
MRSA, PCR: NEGATIVE
Staphylococcus aureus: NEGATIVE

## 2013-04-25 LAB — CBC
HCT: 41.1 % (ref 36.0–46.0)
Hemoglobin: 13.1 g/dL (ref 12.0–15.0)
MCH: 29.2 pg (ref 26.0–34.0)
MCHC: 31.9 g/dL (ref 30.0–36.0)
MCV: 91.5 fL (ref 78.0–100.0)
Platelets: 275 10*3/uL (ref 150–400)
RBC: 4.49 MIL/uL (ref 3.87–5.11)
RDW: 14.3 % (ref 11.5–15.5)
WBC: 7.3 10*3/uL (ref 4.0–10.5)

## 2013-04-25 LAB — BASIC METABOLIC PANEL
BUN: 20 mg/dL (ref 6–23)
CO2: 24 mEq/L (ref 19–32)
CREATININE: 0.84 mg/dL (ref 0.50–1.10)
Calcium: 9.4 mg/dL (ref 8.4–10.5)
Chloride: 103 mEq/L (ref 96–112)
GFR calc non Af Amer: 75 mL/min — ABNORMAL LOW (ref >90)
GFR, EST AFRICAN AMERICAN: 86 mL/min — AB (ref >90)
Glucose, Bld: 102 mg/dL — ABNORMAL HIGH (ref 70–99)
Potassium: 3.8 mEq/L (ref 3.7–5.3)
Sodium: 141 mEq/L (ref 137–147)

## 2013-04-25 LAB — TYPE AND SCREEN
ABO/RH(D): B POS
ANTIBODY SCREEN: NEGATIVE

## 2013-04-25 LAB — ABO/RH: ABO/RH(D): B POS

## 2013-04-25 NOTE — Pre-Procedure Instructions (Signed)
Ashlee Bruce  04/25/2013   Your procedure is scheduled on:  Friday, May 1.  Report to [facility], Main Entrance or Entrance "A" at 8:30AM.  Call this number if you have problems the morning of surgery: [phone]   Remember:   Do not eat food or drink liquids after midnight Thursday, April 30.   Take these medicines the morning of surgery with A SIP OF WATER:  ranitidine (ZANTAC).              Take if needed: Oxycodone.               Stop taking Aspirin and Herbal medications ,Vitamins.    Do not wear jewelry, make-up or nail polish.  Do not wear lotions, powders, or perfumes.   Do not shave 48 hours prior to surgery.   Do not bring valuables to the hospital.  [facility] is not responsible   for any belongings or valuables.               Contacts, dentures or bridgework may not be worn into surgery.  Leave suitcase in the car. After surgery it may be brought to your room.  For patients admitted to the hospital, discharge time is determined by your treatment team.              Special Instructions: Review  [facility] - Preparing For Surgery.   Please read over the following fact sheets that you were given: Pain Booklet, Coughing and Deep Breathing, Blood Transfusion Information and Surgical Site Infection Prevention

## 2013-05-02 MED ORDER — CEFAZOLIN SODIUM-DEXTROSE 2-3 GM-% IV SOLR
2.0000 g | INTRAVENOUS | Status: AC
  Administered 2013-05-03: 2 g via INTRAVENOUS
  Filled 2013-05-02: qty 50

## 2013-05-02 MED ORDER — DEXAMETHASONE SODIUM PHOSPHATE 10 MG/ML IJ SOLN
10.0000 mg | INTRAMUSCULAR | Status: AC
  Administered 2013-05-03: 10 mg via INTRAVENOUS
  Filled 2013-05-02: qty 1

## 2013-05-02 NOTE — Progress Notes (Signed)
Time change noted pt called and informed to be here at 0745 tomorrow morning. Teach back complete. Voices understanding.

## 2013-05-03 ENCOUNTER — Inpatient Hospital Stay (HOSPITAL_COMMUNITY)
Admission: RE | Admit: 2013-05-03 | Discharge: 2013-05-06 | DRG: 460 | Disposition: A | Discharge status: Home Health | Payer: Medicare Other | Source: Ambulatory Visit | Attending: Neurosurgery | Admitting: Neurosurgery

## 2013-05-03 ENCOUNTER — Encounter (HOSPITAL_COMMUNITY): Payer: Medicare Other | Admitting: Anesthesiology

## 2013-05-03 ENCOUNTER — Inpatient Hospital Stay (HOSPITAL_COMMUNITY): Payer: Medicare Other

## 2013-05-03 ENCOUNTER — Encounter (HOSPITAL_COMMUNITY)
Admission: RE | Disposition: A | Discharge status: Home Health | Payer: Self-pay | Source: Ambulatory Visit | Attending: Neurosurgery

## 2013-05-03 ENCOUNTER — Inpatient Hospital Stay (HOSPITAL_COMMUNITY): Payer: Medicare Other | Admitting: Anesthesiology

## 2013-05-03 ENCOUNTER — Encounter (HOSPITAL_COMMUNITY): Payer: Self-pay | Admitting: *Deleted

## 2013-05-03 DIAGNOSIS — K219 Gastro-esophageal reflux disease without esophagitis: Secondary | ICD-10-CM | POA: Diagnosis present

## 2013-05-03 DIAGNOSIS — M48061 Spinal stenosis, lumbar region without neurogenic claudication: Secondary | ICD-10-CM | POA: Diagnosis present

## 2013-05-03 DIAGNOSIS — M4316 Spondylolisthesis, lumbar region: Secondary | ICD-10-CM

## 2013-05-03 DIAGNOSIS — Z981 Arthrodesis status: Secondary | ICD-10-CM

## 2013-05-03 DIAGNOSIS — Z79899 Other long term (current) drug therapy: Secondary | ICD-10-CM

## 2013-05-03 DIAGNOSIS — E119 Type 2 diabetes mellitus without complications: Secondary | ICD-10-CM | POA: Diagnosis present

## 2013-05-03 DIAGNOSIS — F3130 Bipolar disorder, current episode depressed, mild or moderate severity, unspecified: Secondary | ICD-10-CM | POA: Diagnosis present

## 2013-05-03 DIAGNOSIS — Z96659 Presence of unspecified artificial knee joint: Secondary | ICD-10-CM

## 2013-05-03 DIAGNOSIS — I10 Essential (primary) hypertension: Secondary | ICD-10-CM | POA: Diagnosis present

## 2013-05-03 DIAGNOSIS — Z7982 Long term (current) use of aspirin: Secondary | ICD-10-CM

## 2013-05-03 DIAGNOSIS — Q762 Congenital spondylolisthesis: Principal | ICD-10-CM

## 2013-05-03 LAB — GLUCOSE, CAPILLARY
GLUCOSE-CAPILLARY: 211 mg/dL — AB (ref 70–99)
Glucose-Capillary: 109 mg/dL — ABNORMAL HIGH (ref 70–99)
Glucose-Capillary: 169 mg/dL — ABNORMAL HIGH (ref 70–99)
Glucose-Capillary: 179 mg/dL — ABNORMAL HIGH (ref 70–99)

## 2013-05-03 SURGERY — POSTERIOR LUMBAR FUSION 1 LEVEL
Anesthesia: General | Site: Back

## 2013-05-03 MED ORDER — FENTANYL CITRATE 0.05 MG/ML IJ SOLN
INTRAMUSCULAR | Status: AC
  Filled 2013-05-03: qty 5

## 2013-05-03 MED ORDER — LACTATED RINGERS IV SOLN
INTRAVENOUS | Status: DC
  Administered 2013-05-03: 08:00:00 via INTRAVENOUS

## 2013-05-03 MED ORDER — METFORMIN HCL 500 MG PO TABS
1000.0000 mg | ORAL_TABLET | Freq: Every day | ORAL | Status: DC
  Administered 2013-05-04 – 2013-05-06 (×3): 1000 mg via ORAL
  Filled 2013-05-03 (×4): qty 2

## 2013-05-03 MED ORDER — METFORMIN HCL 500 MG PO TABS: 1000.0000 mg | ORAL_TABLET | Freq: Every day | ORAL | Status: DC

## 2013-05-03 MED ORDER — INSULIN ASPART 100 UNIT/ML ~~LOC~~ SOLN: 0.0000 [IU] | Freq: Every day | SUBCUTANEOUS | Status: DC

## 2013-05-03 MED ORDER — DOCUSATE SODIUM 100 MG PO CAPS
100.0000 mg | ORAL_CAPSULE | Freq: Two times a day (BID) | ORAL | Status: DC
  Administered 2013-05-03 – 2013-05-06 (×6): 100 mg via ORAL
  Filled 2013-05-03 (×6): qty 1

## 2013-05-03 MED ORDER — MIDAZOLAM HCL 5 MG/5ML IJ SOLN
INTRAMUSCULAR | Status: DC | PRN
  Administered 2013-05-03: 2 mg via INTRAVENOUS

## 2013-05-03 MED ORDER — INSULIN ASPART 100 UNIT/ML ~~LOC~~ SOLN
0.0000 [IU] | Freq: Three times a day (TID) | SUBCUTANEOUS | Status: DC
  Administered 2013-05-03: 5 [IU] via SUBCUTANEOUS
  Administered 2013-05-06: 2 [IU] via SUBCUTANEOUS

## 2013-05-03 MED ORDER — HYDROMORPHONE HCL PF 1 MG/ML IJ SOLN
0.2500 mg | INTRAMUSCULAR | Status: DC | PRN
  Administered 2013-05-03 (×2): 0.5 mg via INTRAVENOUS

## 2013-05-03 MED ORDER — MIDAZOLAM HCL 2 MG/2ML IJ SOLN
INTRAMUSCULAR | Status: AC
Start: 2013-05-03 — End: 2013-05-03
  Filled 2013-05-03: qty 2

## 2013-05-03 MED ORDER — PANTOPRAZOLE SODIUM 40 MG PO TBEC
40.0000 mg | DELAYED_RELEASE_TABLET | Freq: Every day | ORAL | Status: DC
  Administered 2013-05-03 – 2013-05-05 (×3): 40 mg via ORAL
  Filled 2013-05-03 (×3): qty 1

## 2013-05-03 MED ORDER — POTASSIUM CHLORIDE IN NACL 20-0.45 MEQ/L-% IV SOLN
INTRAVENOUS | Status: DC
  Administered 2013-05-03: 18:00:00 via INTRAVENOUS
  Filled 2013-05-03 (×6): qty 1000

## 2013-05-03 MED ORDER — LIDOCAINE HCL (CARDIAC) 20 MG/ML IV SOLN
INTRAVENOUS | Status: AC
  Filled 2013-05-03: qty 5

## 2013-05-03 MED ORDER — ONDANSETRON HCL 4 MG/2ML IJ SOLN
4.0000 mg | INTRAMUSCULAR | Status: DC | PRN
  Administered 2013-05-03: 4 mg via INTRAVENOUS
  Filled 2013-05-03: qty 2

## 2013-05-03 MED ORDER — SODIUM CHLORIDE 0.9 % IR SOLN
Status: DC | PRN
  Administered 2013-05-03 (×2)

## 2013-05-03 MED ORDER — QUETIAPINE FUMARATE 300 MG PO TABS
300.0000 mg | ORAL_TABLET | Freq: Every day | ORAL | Status: DC
  Filled 2013-05-03 (×4): qty 1

## 2013-05-03 MED ORDER — ONDANSETRON HCL 4 MG/2ML IJ SOLN
INTRAMUSCULAR | Status: AC
  Filled 2013-05-03: qty 2

## 2013-05-03 MED ORDER — CYCLOBENZAPRINE HCL 10 MG PO TABS
10.0000 mg | ORAL_TABLET | Freq: Three times a day (TID) | ORAL | Status: DC | PRN
  Administered 2013-05-04 – 2013-05-05 (×2): 10 mg via ORAL
  Filled 2013-05-03 (×2): qty 1

## 2013-05-03 MED ORDER — INSULIN ASPART 100 UNIT/ML ~~LOC~~ SOLN: 4.0000 [IU] | Freq: Three times a day (TID) | SUBCUTANEOUS | Status: DC

## 2013-05-03 MED ORDER — PANTOPRAZOLE SODIUM 40 MG IV SOLR: 40.0000 mg | Freq: Every day | INTRAVENOUS | Status: DC

## 2013-05-03 MED ORDER — ACETAMINOPHEN 325 MG PO TABS: 650.0000 mg | ORAL_TABLET | ORAL | Status: DC | PRN

## 2013-05-03 MED ORDER — DEXAMETHASONE SODIUM PHOSPHATE 4 MG/ML IJ SOLN
INTRAMUSCULAR | Status: AC
  Filled 2013-05-03: qty 2

## 2013-05-03 MED ORDER — ACETAMINOPHEN 650 MG RE SUPP: 650.0000 mg | RECTAL | Status: DC | PRN

## 2013-05-03 MED ORDER — PROPOFOL 10 MG/ML IV BOLUS
INTRAVENOUS | Status: AC
  Filled 2013-05-03: qty 20

## 2013-05-03 MED ORDER — THROMBIN 20000 UNITS EX SOLR
CUTANEOUS | Status: DC | PRN
  Administered 2013-05-03: 11:00:00 via TOPICAL

## 2013-05-03 MED ORDER — SODIUM CHLORIDE 0.9 % IV SOLN: 250.0000 mL | INTRAVENOUS | Status: DC

## 2013-05-03 MED ORDER — DEXAMETHASONE 4 MG PO TABS
4.0000 mg | ORAL_TABLET | Freq: Four times a day (QID) | ORAL | Status: AC
  Administered 2013-05-03: 4 mg via ORAL
  Filled 2013-05-03 (×2): qty 1

## 2013-05-03 MED ORDER — GLYCOPYRROLATE 0.2 MG/ML IJ SOLN
INTRAMUSCULAR | Status: AC
  Filled 2013-05-03: qty 3

## 2013-05-03 MED ORDER — FENTANYL CITRATE 0.05 MG/ML IJ SOLN
INTRAMUSCULAR | Status: AC
Start: 2013-05-03 — End: 2013-05-03
  Filled 2013-05-03: qty 5

## 2013-05-03 MED ORDER — CEFAZOLIN SODIUM-DEXTROSE 2-3 GM-% IV SOLR
2.0000 g | Freq: Three times a day (TID) | INTRAVENOUS | Status: AC
  Administered 2013-05-03 – 2013-05-04 (×2): 2 g via INTRAVENOUS
  Filled 2013-05-03 (×2): qty 50

## 2013-05-03 MED ORDER — METOCLOPRAMIDE HCL 5 MG/ML IJ SOLN: 10.0000 mg | Freq: Once | INTRAMUSCULAR | Status: DC | PRN

## 2013-05-03 MED ORDER — ARTIFICIAL TEARS OP OINT
TOPICAL_OINTMENT | OPHTHALMIC | Status: DC | PRN
  Administered 2013-05-03: 1 via OPHTHALMIC

## 2013-05-03 MED ORDER — METOCLOPRAMIDE HCL 5 MG/ML IJ SOLN
INTRAMUSCULAR | Status: AC
  Filled 2013-05-03: qty 2

## 2013-05-03 MED ORDER — PROPOFOL 10 MG/ML IV BOLUS
INTRAVENOUS | Status: DC | PRN
  Administered 2013-05-03: 80 mg via INTRAVENOUS
  Administered 2013-05-03: 20 mg via INTRAVENOUS
  Administered 2013-05-03: 200 mg via INTRAVENOUS

## 2013-05-03 MED ORDER — NEOSTIGMINE METHYLSULFATE 10 MG/10ML IV SOLN
INTRAVENOUS | Status: DC | PRN
  Administered 2013-05-03: 5 mg via INTRAVENOUS

## 2013-05-03 MED ORDER — ROCURONIUM BROMIDE 100 MG/10ML IV SOLN
INTRAVENOUS | Status: DC | PRN
  Administered 2013-05-03: 50 mg via INTRAVENOUS

## 2013-05-03 MED ORDER — LACTATED RINGERS IV SOLN
INTRAVENOUS | Status: DC | PRN
  Administered 2013-05-03 (×4): via INTRAVENOUS

## 2013-05-03 MED ORDER — DEXAMETHASONE SODIUM PHOSPHATE 4 MG/ML IJ SOLN
4.0000 mg | Freq: Four times a day (QID) | INTRAMUSCULAR | Status: AC
  Administered 2013-05-03: 4 mg via INTRAVENOUS
  Filled 2013-05-03: qty 1

## 2013-05-03 MED ORDER — PHENOL 1.4 % MT LIQD: 1.0000 | OROMUCOSAL | Status: DC | PRN

## 2013-05-03 MED ORDER — INSULIN ASPART 100 UNIT/ML ~~LOC~~ SOLN
SUBCUTANEOUS | Status: AC
  Filled 2013-05-03: qty 1

## 2013-05-03 MED ORDER — SODIUM CHLORIDE 0.9 % IJ SOLN
3.0000 mL | Freq: Two times a day (BID) | INTRAMUSCULAR | Status: DC
  Administered 2013-05-03 – 2013-05-05 (×4): 3 mL via INTRAVENOUS

## 2013-05-03 MED ORDER — MENTHOL 3 MG MT LOZG: 1.0000 | LOZENGE | OROMUCOSAL | Status: DC | PRN

## 2013-05-03 MED ORDER — GLYCOPYRROLATE 0.2 MG/ML IJ SOLN
INTRAMUSCULAR | Status: DC | PRN
  Administered 2013-05-03: 0.6 mg via INTRAVENOUS

## 2013-05-03 MED ORDER — OXYCODONE HCL 5 MG/5ML PO SOLN: 5.0000 mg | Freq: Once | ORAL | Status: DC | PRN

## 2013-05-03 MED ORDER — SODIUM CHLORIDE 0.9 % IJ SOLN: 3.0000 mL | INTRAMUSCULAR | Status: DC | PRN

## 2013-05-03 MED ORDER — VECURONIUM BROMIDE 10 MG IV SOLR
INTRAVENOUS | Status: DC | PRN
  Administered 2013-05-03: 4 mg via INTRAVENOUS
  Administered 2013-05-03: 2 mg via INTRAVENOUS
  Administered 2013-05-03: 6 mg via INTRAVENOUS

## 2013-05-03 MED ORDER — DEXTROSE 5 % IV SOLN
INTRAVENOUS | Status: DC | PRN
Start: 2013-05-03 — End: 2013-05-03
  Administered 2013-05-03: 10:00:00 via INTRAVENOUS

## 2013-05-03 MED ORDER — SIMVASTATIN 5 MG PO TABS
5.0000 mg | ORAL_TABLET | Freq: Every day | ORAL | Status: DC
  Administered 2013-05-03 – 2013-05-05 (×3): 5 mg via ORAL
  Filled 2013-05-03 (×4): qty 1

## 2013-05-03 MED ORDER — ONDANSETRON HCL 4 MG/2ML IJ SOLN
INTRAMUSCULAR | Status: DC | PRN
  Administered 2013-05-03 (×2): 4 mg via INTRAVENOUS

## 2013-05-03 MED ORDER — NEOSTIGMINE METHYLSULFATE 10 MG/10ML IV SOLN
INTRAVENOUS | Status: AC
  Filled 2013-05-03: qty 1

## 2013-05-03 MED ORDER — FENTANYL CITRATE 0.05 MG/ML IJ SOLN
INTRAMUSCULAR | Status: DC | PRN
  Administered 2013-05-03: 50 ug via INTRAVENOUS
  Administered 2013-05-03: 200 ug via INTRAVENOUS
  Administered 2013-05-03 (×6): 50 ug via INTRAVENOUS

## 2013-05-03 MED ORDER — OXYCODONE-ACETAMINOPHEN 5-325 MG PO TABS
1.0000 | ORAL_TABLET | ORAL | Status: DC | PRN
  Administered 2013-05-03: 1 via ORAL
  Administered 2013-05-03 – 2013-05-06 (×10): 2 via ORAL
  Filled 2013-05-03 (×11): qty 2

## 2013-05-03 MED ORDER — VECURONIUM BROMIDE 10 MG IV SOLR
INTRAVENOUS | Status: AC
  Filled 2013-05-03: qty 20

## 2013-05-03 MED ORDER — LIDOCAINE HCL (CARDIAC) 20 MG/ML IV SOLN
INTRAVENOUS | Status: AC
  Filled 2013-05-03: qty 10

## 2013-05-03 MED ORDER — LIDOCAINE HCL (CARDIAC) 20 MG/ML IV SOLN
INTRAVENOUS | Status: DC | PRN
  Administered 2013-05-03: 100 mg via INTRAVENOUS

## 2013-05-03 MED ORDER — METOCLOPRAMIDE HCL 5 MG/ML IJ SOLN
INTRAMUSCULAR | Status: DC | PRN
  Administered 2013-05-03: 10 mg via INTRAVENOUS

## 2013-05-03 MED ORDER — 0.9 % SODIUM CHLORIDE (POUR BTL) OPTIME
TOPICAL | Status: DC | PRN
  Administered 2013-05-03: 1000 mL

## 2013-05-03 MED ORDER — OXYCODONE HCL 5 MG PO TABS: 5.0000 mg | ORAL_TABLET | Freq: Once | ORAL | Status: DC | PRN

## 2013-05-03 MED ORDER — HYDROCHLOROTHIAZIDE 25 MG PO TABS
25.0000 mg | ORAL_TABLET | Freq: Every day | ORAL | Status: DC
  Administered 2013-05-03 – 2013-05-06 (×4): 25 mg via ORAL
  Filled 2013-05-03 (×4): qty 1

## 2013-05-03 MED ORDER — BUPIVACAINE HCL (PF) 0.5 % IJ SOLN
INTRAMUSCULAR | Status: DC | PRN
  Administered 2013-05-03: 20 mL

## 2013-05-03 MED ORDER — HYDROMORPHONE HCL PF 1 MG/ML IJ SOLN
1.0000 mg | INTRAMUSCULAR | Status: DC | PRN
  Administered 2013-05-03: 1.5 mg via INTRAMUSCULAR
  Administered 2013-05-04 (×2): 1 mg via INTRAMUSCULAR
  Administered 2013-05-04: 1.5 mg via INTRAMUSCULAR
  Administered 2013-05-04 – 2013-05-05 (×2): 1 mg via INTRAMUSCULAR
  Administered 2013-05-05: 1.5 mg via INTRAMUSCULAR
  Administered 2013-05-05 – 2013-05-06 (×4): 1 mg via INTRAMUSCULAR
  Filled 2013-05-03 (×2): qty 1
  Filled 2013-05-03: qty 2
  Filled 2013-05-03 (×4): qty 1
  Filled 2013-05-03 (×2): qty 2
  Filled 2013-05-03 (×2): qty 1

## 2013-05-03 MED ORDER — QUINAPRIL HCL 10 MG PO TABS
20.0000 mg | ORAL_TABLET | Freq: Every day | ORAL | Status: DC
  Administered 2013-05-03 – 2013-05-04 (×2): 20 mg via ORAL
  Filled 2013-05-03 (×3): qty 2

## 2013-05-03 MED ORDER — ROCURONIUM BROMIDE 50 MG/5ML IV SOLN
INTRAVENOUS | Status: AC
  Filled 2013-05-03: qty 1

## 2013-05-03 SURGICAL SUPPLY — 75 items
BAG DECANTER FOR FLEXI CONT (MISCELLANEOUS) ×6 IMPLANT
BENZOIN TINCTURE PRP APPL 2/3 (GAUZE/BANDAGES/DRESSINGS) ×6 IMPLANT
BLADE 10 SAFETY STRL DISP (BLADE) ×3 IMPLANT
BLADE SURG ROTATE 9660 (MISCELLANEOUS) IMPLANT
BONE EQUIVA 10CC (Bone Implant) ×3 IMPLANT
BRUSH SCRUB EZ PLAIN DRY (MISCELLANEOUS) ×3 IMPLANT
BUR CUTTER 7.0 ROUND (BURR) ×3 IMPLANT
BUR MATCHSTICK NEURO 3.0 LAGG (BURR) ×3 IMPLANT
CAGE PEEK OPTIMA ARDIS 11X9X26 (Cage) ×6 IMPLANT
CANISTER SUCT 3000ML (MISCELLANEOUS) ×3 IMPLANT
CLOSURE WOUND 1/2 X4 (GAUZE/BANDAGES/DRESSINGS) ×2
CONT SPEC 4OZ CLIKSEAL STRL BL (MISCELLANEOUS) ×6 IMPLANT
COVER BACK TABLE 24X17X13 BIG (DRAPES) IMPLANT
COVER TABLE BACK 60X90 (DRAPES) ×3 IMPLANT
DERMABOND ADVANCED (GAUZE/BANDAGES/DRESSINGS)
DERMABOND ADVANCED .7 DNX12 (GAUZE/BANDAGES/DRESSINGS) IMPLANT
DILATOR NON-RADIOLUCENT CANN (MISCELLANEOUS) ×3 IMPLANT
DRAPE C-ARM 42X72 X-RAY (DRAPES) ×6 IMPLANT
DRAPE C-ARMOR (DRAPES) ×3 IMPLANT
DRAPE LAPAROTOMY 100X72X124 (DRAPES) ×3 IMPLANT
DRAPE SURG 17X23 STRL (DRAPES) ×6 IMPLANT
DRESSING TELFA 8X3 (GAUZE/BANDAGES/DRESSINGS) IMPLANT
DRSG OPSITE POSTOP 4X6 (GAUZE/BANDAGES/DRESSINGS) ×6 IMPLANT
DURAPREP 26ML APPLICATOR (WOUND CARE) ×3 IMPLANT
ELECT REM PT RETURN 9FT ADLT (ELECTROSURGICAL) ×3
ELECTRODE REM PT RTRN 9FT ADLT (ELECTROSURGICAL) ×1 IMPLANT
EVACUATOR 1/8 PVC DRAIN (DRAIN) IMPLANT
EVACUATOR 3/16  PVC DRAIN (DRAIN) ×2
EVACUATOR 3/16 PVC DRAIN (DRAIN) ×1 IMPLANT
GAUZE SPONGE 4X4 16PLY XRAY LF (GAUZE/BANDAGES/DRESSINGS) IMPLANT
GLOVE BIO SURGEON STRL SZ 6.5 (GLOVE) ×4 IMPLANT
GLOVE BIO SURGEON STRL SZ8 (GLOVE) ×6 IMPLANT
GLOVE BIO SURGEONS STRL SZ 6.5 (GLOVE) ×2
GLOVE BIOGEL PI IND STRL 7.0 (GLOVE) ×1 IMPLANT
GLOVE BIOGEL PI INDICATOR 7.0 (GLOVE) ×2
GLOVE ECLIPSE 8.0 STRL XLNG CF (GLOVE) ×6 IMPLANT
GLOVE EXAM NITRILE LRG STRL (GLOVE) IMPLANT
GLOVE EXAM NITRILE MD LF STRL (GLOVE) IMPLANT
GLOVE EXAM NITRILE XS STR PU (GLOVE) IMPLANT
GOWN BRE IMP SLV AUR LG STRL (GOWN DISPOSABLE) IMPLANT
GOWN BRE IMP SLV AUR XL STRL (GOWN DISPOSABLE) IMPLANT
GOWN STRL REIN 2XL LVL4 (GOWN DISPOSABLE) IMPLANT
GOWN STRL REUS W/ TWL LRG LVL3 (GOWN DISPOSABLE) ×1 IMPLANT
GOWN STRL REUS W/ TWL XL LVL3 (GOWN DISPOSABLE) ×2 IMPLANT
GOWN STRL REUS W/TWL 2XL LVL3 (GOWN DISPOSABLE) ×3 IMPLANT
GOWN STRL REUS W/TWL LRG LVL3 (GOWN DISPOSABLE) ×2
GOWN STRL REUS W/TWL XL LVL3 (GOWN DISPOSABLE) ×4
K-WIRE NITHNOL TROCAR TIP (WIRE) ×15 IMPLANT
KIT BASIN OR (CUSTOM PROCEDURE TRAY) ×3 IMPLANT
KIT ROOM TURNOVER OR (KITS) ×3 IMPLANT
NEEDLE HYPO 22GX1.5 SAFETY (NEEDLE) ×3 IMPLANT
NEEDLE TARGETING (NEEDLE) ×12 IMPLANT
NS IRRIG 1000ML POUR BTL (IV SOLUTION) ×3 IMPLANT
PACK LAMINECTOMY NEURO (CUSTOM PROCEDURE TRAY) ×3 IMPLANT
PAD ARMBOARD 7.5X6 YLW CONV (MISCELLANEOUS) ×9 IMPLANT
PATTIES SURGICAL .75X.75 (GAUZE/BANDAGES/DRESSINGS) IMPLANT
ROD BENT PERC 35MM (Rod) ×6 IMPLANT
SCREW MIN INVASIVE 6.5X45 (Screw) ×6 IMPLANT
SCREW POLYAXIA MIS 6.5X40MM (Screw) ×6 IMPLANT
SPONGE GAUZE 4X4 12PLY (GAUZE/BANDAGES/DRESSINGS) ×3 IMPLANT
SPONGE LAP 4X18 X RAY DECT (DISPOSABLE) IMPLANT
SPONGE SURGIFOAM ABS GEL 100 (HEMOSTASIS) ×3 IMPLANT
STRIP CLOSURE SKIN 1/2X4 (GAUZE/BANDAGES/DRESSINGS) ×4 IMPLANT
SUT PROLENE 0 CT 1 30 (SUTURE) IMPLANT
SUT VIC AB 0 CT1 18XCR BRD8 (SUTURE) ×2 IMPLANT
SUT VIC AB 0 CT1 8-18 (SUTURE) ×4
SUT VIC AB 2-0 OS6 18 (SUTURE) ×9 IMPLANT
SUT VIC AB 3-0 CP2 18 (SUTURE) ×3 IMPLANT
SYR 20ML ECCENTRIC (SYRINGE) ×3 IMPLANT
TOP CLSR SEQUOIA (Orthopedic Implant) ×12 IMPLANT
TOWEL OR 17X24 6PK STRL BLUE (TOWEL DISPOSABLE) ×3 IMPLANT
TOWEL OR 17X26 10 PK STRL BLUE (TOWEL DISPOSABLE) ×3 IMPLANT
TRAP SPECIMEN MUCOUS 40CC (MISCELLANEOUS) ×3 IMPLANT
TRAY FOLEY CATH 14FRSI W/METER (CATHETERS) ×3 IMPLANT
WATER STERILE IRR 1000ML POUR (IV SOLUTION) ×3 IMPLANT

## 2013-05-03 NOTE — Anesthesia Preprocedure Evaluation (Signed)
Anesthesia Evaluation  Patient identified by MRN, date of birth, ID band Patient awake    Reviewed: Allergy & Precautions, H&P , NPO status , Patient's Chart, lab work & pertinent test results, reviewed documented beta blocker date and time   Airway Mallampati: II TM Distance: >3 FB Neck ROM: full    Dental   Pulmonary neg pulmonary ROS, shortness of breath,  breath sounds clear to auscultation        Cardiovascular hypertension, Pt. on medications negative cardio ROS  Rhythm:regular     Neuro/Psych PSYCHIATRIC DISORDERS Depression Bipolar Disorder negative neurological ROS  negative psych ROS   GI/Hepatic negative GI ROS, Neg liver ROS, GERD-  Medicated and Controlled,  Endo/Other  negative endocrine ROSdiabetes, Oral Hypoglycemic Agents  Renal/GU negative Renal ROS  negative genitourinary   Musculoskeletal   Abdominal   Peds  Hematology negative hematology ROS (+)   Anesthesia Other Findings See surgeon's H&P   Reproductive/Obstetrics negative OB ROS                           Anesthesia Physical Anesthesia Plan  ASA: II  Anesthesia Plan: General   Post-op Pain Management:    Induction: Intravenous  Airway Management Planned: Oral ETT  Additional Equipment:   Intra-op Plan:   Post-operative Plan: Extubation in OR  Informed Consent: I have reviewed the patients History and Physical, chart, labs and discussed the procedure including the risks, benefits and alternatives for the proposed anesthesia with the patient or authorized representative who has indicated his/her understanding and acceptance.   Dental Advisory Given  Plan Discussed with: CRNA and Surgeon  Anesthesia Plan Comments:         Anesthesia Quick Evaluation

## 2013-05-03 NOTE — Op Note (Signed)
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 Localizing fluoroscopy was used prior to incision to identify the appropriate level. Midline incision was made above the spinous processes of L4 and L5. Using Bovie cutting current the incision was carried on the spinous processes. We then dissected the plane between the subcutaneous fat and the fascia. We then opened the fascia on the spinous processes of L4 and L5 bilaterally and expose the lamina and facet joints. Self-retaining tract was placed for exposure and x-ray showed approach the appropriate level. Spinous processes of L4 and L5 were removed. Generous bilateral laminectomy was then performed removing the inferior 80% of the L4 lamina the medial three quarters of the facet joints bilaterally and the superior one third of the L5 lamina. Residual bone was removed and saved for use later in the case ligamentum flavum was removed in a piecemeal fashion. At this time bilateral discectomy was carried out at L4-5. Thorough displaced cleanout was carried out with the same time great care was taken to avoid injury to the neural elements this was successfully done. At this point we prepared the interspace for interbody fusion. We did a variety of techniques to clean up the endplates and prepare them for a fusion. We then packed to 11 x 9 x 26 mm cages with a mixture of autologous bone morselized allograft. The  cages were then impacted at difficulty. Prior to placing the second cage we placed a mixture of autologous bone morselized allograft it within the interspace to help with interbody fusion. The cages were followed in excellent position under fluoroscopy. At this time we irrigated copiously controlled any bleeding with upper coagulation Gelfoam. We then closed the midline fascia with interrupted 0 Vicryl. We then placed percutaneous pedicle screws. We passed the Jamshidi needle from the fascia starting the pedicle from lateral to medial direction. We placed a guidewire through the Jamshidi needles remove the needles. We then connected the fascia between the 2 needles on each side. Then did sequential dilation through the muscles. We tapped with the 6.0 mm tap and placed 6.5 x 40 mm screws at L4 and 6.5 x 40 mm 5 mm screws at L5. We then connected the 2 fascial openings past appropriate length rods down the [place]. We secured the rods to the top of the screws with top loading nuts and did tightening and final tightening with torque and counter torque. We then removed the [facility]. Final x-ray looked good in AP lateral direction. We irrigated these wounds and closed within the ruptured 0 Vicryl and then closed the wound with multiple layers of Vicryl and a running locking Prolene on the skin. Shortness was then applied the patient was extubated and taken to recovery room in stable condition.

## 2013-05-03 NOTE — Transfer of Care (Signed)
Immediate Anesthesia Transfer of Care Note  Patient: Ashlee Bruce  Procedure(s) Performed: Procedure(s) with comments: POSTERIOR LUMBAR FUSION Lumbar Four-Five (N/A) - POSTERIOR LUMBAR FUSION Lumbar Four-Five  Patient Location: PACU  Anesthesia Type:General  Level of Consciousness: oriented, sedated, patient cooperative and responds to stimulation  Airway & Oxygen Therapy: Patient Spontanous Breathing and Patient connected to nasal cannula oxygen  Post-op Assessment: Report given to PACU RN, Post -op Vital signs reviewed and stable, Patient moving all extremities and Patient moving all extremities X 4  Post vital signs: Reviewed and stable  Complications: No apparent anesthesia complications

## 2013-05-03 NOTE — H&P (Signed)
Ashlee Bruce is an 58 y.o. female.   Chief Complaint: Back and lower extremity pain HPI: The patient is a 58 year old female who initially presented with both neck and arm pain as well as chronic low back pain with radiation to lower extremities. She underwent an anterior cervical discectomy initially when she did very well from. She had marked improvement in her upper extremity symptoms. She then returns complaining of increasing pain in her legs once again particularly on the right side. She was reevaluated underwent myelography with postcontrast CT which showed a grade 1 spondylolisthesis at L4-5 which was the third of the disc space from the bottom. This was mobile on flexion-extension there is a diffuse disc herniation. After discussing the options and failing additional conservative therapy the patient requested surgery and now comes for a posterior lumbar interbody fusion with pedicle screw fixation. I have had a long discussion with her regarding the risks and benefits of surgical intervention. The risks discussed include but are not limited to bleeding infection weakness numbness paralysis trouble with instrumentation nonunion spinal fluid leakage coma and death. We have discussed alternative methods of therapy although risks and benefits of nonintervention. She's had the opportunity to ask numerous questions and appears to understand. With this information in hand she has requested we proceed with surgery.  Past Medical History  Diagnosis Date  . Hypertension   . Depression   . Diabetes mellitus without complication   . GERD (gastroesophageal reflux disease)   . Transfusion history     with knee replacement  . Shortness of breath     with exertion  . Bipolar disorder     Past Surgical History  Procedure Laterality Date  . Cholecystectomy    . Breast surgery      LEFT   PRECANCER CELLS REMOVED   2009    . Knee arthroplasty      RIGHT  2007     . Thyroid surgery      NODULE  REMOVED   1989   . Ectopic pregnancy surgery      TUBE REMOVED  . Wrist surgery      LEFT     2010- fracture  . Anterior cervical decomp/discectomy fusion N/A 07/26/2012    Procedure: ANTERIOR CERVICAL DECOMPRESSION/DISCECTOMY FUSION 2 LEVELS;  Surgeon: Faythe Ghee, MD;  Location: [facility];  Service: Neurosurgery;  Laterality: N/A;  Cervical Five-Six Cervical Six-Seven  Anterior cervical decompression/diskectomy/fusion/plate (class 4- fly in room)  . Joint replacement Right 2007  . Knee arthroscopy      History reviewed. No pertinent family history. Social History:  reports that she has never smoked. She does not have any smokeless tobacco history on file. She reports that she drinks about .6 ounces of alcohol per week. She reports that she does not use illicit drugs.  Allergies: No Known Allergies  Medications Prior to Admission  Medication Sig Dispense Refill  . Artificial Tear Ointment (DRY EYES OP) Place 1 drop into both eyes as needed (dry eyes).      Marland Kitchen aspirin 81 MG tablet Take 81 mg by mouth daily.      . cholecalciferol (VITAMIN D) 1000 UNITS tablet Take 2,000 Units by mouth daily.       . hydrochlorothiazide (HYDRODIURIL) 25 MG tablet Take 25 mg by mouth daily.      Marland Kitchen lovastatin (MEVACOR) 10 MG tablet Take 10 mg by mouth at bedtime.      . metFORMIN (GLUCOPHAGE) 1000 MG tablet Take  1,000 mg by mouth daily with breakfast.      . Oxycodone HCl 10 MG TABS Take 10 mg by mouth every 6 (six) hours as needed (for pain).      . QUEtiapine (SEROQUEL) 300 MG tablet Take 300 mg by mouth at bedtime.      . quinapril (ACCUPRIL) 20 MG tablet Take 20 mg by mouth at bedtime.      . ranitidine (ZANTAC) 150 MG tablet Take 150 mg by mouth 2 (two) times daily.        Results for orders placed during the hospital encounter of 05/03/13 (from the past 48 hour(s))  GLUCOSE, CAPILLARY     Status: Abnormal   Collection Time    05/03/13  7:49 AM      Result Value Ref Range   Glucose-Capillary  109 (*) 70 - 99 mg/dL   No results found.  Positive for arthritis and diabetes  Blood pressure 171/91, pulse 91, temperature 98 F (36.7 C), temperature source Oral, resp. rate 18, height 5\' 8"  (1.727 m), weight 88.451 kg (195 lb), SpO2 100.00%.  The patient is awake or and oriented. She is no facial asymmetry. Her gait is slow mildly antalgic. Reflexes are decreased but equal. Strength is intact. Assessment/Plan Impression is that of listhesis which is mobile on flexion-extension with stenosis at L4-5. The plan is for interbody fusion with pedicle screw fixation.  Faythe Ghee, MD 05/03/2013, 9:29 AM

## 2013-05-03 NOTE — Anesthesia Procedure Notes (Signed)
Procedure Name: Intubation Date/Time: 05/03/2013 10:24 AM Performed by: Jacquiline Doe A Pre-anesthesia Checklist: Patient identified, Timeout performed, Emergency Drugs available, Suction available and Patient being monitored Patient Re-evaluated:Patient Re-evaluated prior to inductionOxygen Delivery Method: Circle system utilized Intubation Type: IV induction and Cricoid Pressure applied Ventilation: Mask ventilation without difficulty and Oral airway inserted - appropriate to patient size Laryngoscope Size: Mac and 3 Grade View: Grade I Tube type: Oral Tube size: 8.0 mm Number of attempts: 1 Airway Equipment and Method: Stylet Placement Confirmation: ETT inserted through vocal cords under direct vision,  breath sounds checked- equal and bilateral and positive ETCO2 Secured at: 22 cm Tube secured with: Tape Dental Injury: Teeth and Oropharynx as per pre-operative assessment

## 2013-05-03 NOTE — Anesthesia Postprocedure Evaluation (Signed)
Anesthesia Post Note  Patient: Ashlee Bruce  Procedure(s) Performed: Procedure(s) (LRB): POSTERIOR LUMBAR FUSION Lumbar Four-Five (N/A)  Anesthesia type: General  Patient location: PACU  Post pain: Pain level controlled  Post assessment: Patient's Cardiovascular Status Stable  Last Vitals:  Filed Vitals:   05/03/13 1545  BP: 137/83  Pulse: 77  Temp:   Resp: 17    Post vital signs: Reviewed and stable  Level of consciousness: alert  Complications: No apparent anesthesia complications

## 2013-05-04 LAB — GLUCOSE, CAPILLARY
Glucose-Capillary: 245 mg/dL — ABNORMAL HIGH (ref 70–99)
Glucose-Capillary: 145 mg/dL — ABNORMAL HIGH (ref 70–99)
Glucose-Capillary: 108 mg/dL — ABNORMAL HIGH (ref 70–99)
Glucose-Capillary: 128 mg/dL — ABNORMAL HIGH (ref 70–99)

## 2013-05-04 NOTE — Progress Notes (Signed)
No issues overnight. Pt reports back soreness, no leg pain which she had preop. Ambulating, tolerating diet.  EXAM:  BP 116/83  Pulse 87  Temp(Src) 98.2 F (36.8 C) (Oral)  Resp 20  Ht 5\' 8"  (1.727 m)  Wt 88.451 kg (195 lb)  BMI 29.66 kg/m2  SpO2 93%  Awake, alert, oriented  Speech fluent, appropriate  CN grossly intact  5/5 BUE/BLE  Wound c/d/i  IMPRESSION:  58 y.o. female POD#1 s/p L4-5 PLIF, doing well  PLAN: - Cont to mobilize.  - Pt prefers to go home tomorrow or [place] so family is available for help.

## 2013-05-05 LAB — GLUCOSE, CAPILLARY
GLUCOSE-CAPILLARY: 127 mg/dL — AB (ref 70–99)
GLUCOSE-CAPILLARY: 82 mg/dL (ref 70–99)
GLUCOSE-CAPILLARY: 153 mg/dL — AB (ref 70–99)
Glucose-Capillary: 121 mg/dL — ABNORMAL HIGH (ref 70–99)

## 2013-05-05 MED ORDER — QUINAPRIL HCL 10 MG PO TABS
20.0000 mg | ORAL_TABLET | Freq: Every day | ORAL | Status: DC
  Administered 2013-05-05 – 2013-05-06 (×2): 20 mg via ORAL
  Filled 2013-05-05 (×2): qty 2

## 2013-05-05 NOTE — Progress Notes (Signed)
Patient ID: Ashlee Bruce, female   DOB: [birth year], 58 y.o.   MRN: [id number] Subjective: Patient reports she is doing fairly well. She has pain in her back and some burning around the incision. Her legs are doing great.  Objective: Vital signs in last 24 hours: Temp:  [98.1 F (36.7 C)-99.3 F (37.4 C)] 99.1 F (37.3 C) (05/03 0605) Pulse Rate:  [87-92] 92 (05/03 0605) Resp:  [20] 20 (05/03 0605) BP: (103-136)/(59-87) 104/72 mmHg (05/03 0605) SpO2:  [92 %-97 %] 97 % (05/03 0605)  Intake/Output from previous day: 05/02 0701 - 05/03 0700 In: 1245 [P.O.:1090; I.V.:300] Out: 250 [Urine:250] Intake/Output this shift: Total I/O In: 240 [P.O.:240] Out: -   Neurologic: Grossly normal  Lab Results: Lab Results  Component Value Date   WBC 7.3 04/25/2013   HGB 13.1 04/25/2013   HCT 41.1 04/25/2013   MCV 91.5 04/25/2013   PLT 275 04/25/2013   No results found for this basename: INR, PROTIME   BMET Lab Results  Component Value Date   NA 141 04/25/2013   K 3.8 04/25/2013   CL 103 04/25/2013   CO2 24 04/25/2013   GLUCOSE 102* 04/25/2013   BUN 20 04/25/2013   CREATININE 0.84 04/25/2013   CALCIUM 9.4 04/25/2013    Studies/Results: Dg Lumbar Spine 2-3 Views  05/03/2013   CLINICAL DATA:  L4-L5 PLIF  EXAM: DG C-ARM 61-120 MIN; LUMBAR SPINE - 2-3 VIEW  FLUOROSCOPY TIME:  2 min, 13 seconds  COMPARISON:  Lumbar spine CT -04/01/2013  FINDINGS: Two spot intraoperative fluoroscopic images of the lower lumbar spine a provided for review.  Spinal labeling is in keeping with preprocedural lumbar spine CT at which time 6 lumbar type vertebral bodies were noted to be present with transitional level again labeled S1.  Images demonstrate the sequela of L4-L5 paraspinal fusion intervertebral disc space replacement. There has been interval restoration of the L4-L5 intervertebral disc space height. Alignment appears near anatomic. No radiopaque foreign body.  IMPRESSION: Post L4-L5 paraspinal fusion  intervertebral disc space replacement without evidence of complication.   Electronically Signed   By: Sandi Mariscal M.D.   On: 05/03/2013 14:42   Dg C-arm 61-120 Min  05/03/2013   CLINICAL DATA:  L4-L5 PLIF  EXAM: DG C-ARM 61-120 MIN; LUMBAR SPINE - 2-3 VIEW  FLUOROSCOPY TIME:  2 min, 13 seconds  COMPARISON:  Lumbar spine CT -04/01/2013  FINDINGS: Two spot intraoperative fluoroscopic images of the lower lumbar spine a provided for review.  Spinal labeling is in keeping with preprocedural lumbar spine CT at which time 6 lumbar type vertebral bodies were noted to be present with transitional level again labeled S1.  Images demonstrate the sequela of L4-L5 paraspinal fusion intervertebral disc space replacement. There has been interval restoration of the L4-L5 intervertebral disc space height. Alignment appears near anatomic. No radiopaque foreign body.  IMPRESSION: Post L4-L5 paraspinal fusion intervertebral disc space replacement without evidence of complication.   Electronically Signed   By: Sandi Mariscal M.D.   On: 05/03/2013 14:42    Assessment/Plan:  she seems to be doing fairly well other than appropriate back soreness. She wants another day in the hospital because of her back pain. I don't think that's unreasonable.   LOS: 2 days    Eustace Moore 05/05/2013, 9:30 AM

## 2013-05-06 LAB — GLUCOSE, CAPILLARY
Glucose-Capillary: 125 mg/dL — ABNORMAL HIGH (ref 70–99)
Glucose-Capillary: 76 mg/dL (ref 70–99)

## 2013-05-06 MED ORDER — OXYCODONE HCL 10 MG PO TABS: 10.0000 mg | ORAL_TABLET | ORAL | Status: DC | PRN

## 2013-05-06 NOTE — Progress Notes (Signed)
Discharge instructions given. Pt verbalized understanding and all questions were answered.  

## 2013-05-06 NOTE — Discharge Summary (Signed)
  Physician Discharge Summary  Patient ID: Ashlee Bruce MRN: [id number] DOB/AGE: 06-20-1955 58 y.o.  Admit date: 05/03/2013 Discharge date: 05/06/2013  Admission Diagnoses:  Discharge Diagnoses:  Active Problems:   Spondylolisthesis at L4-L5 level   Discharged Condition: good  Hospital Course: Surgery 3 days ago for mobile listhesis at L 45. Did very well. Slow to increase activity. On pod 3, doing well. Wound healing well. Ambulating without difficulty. Home with specific instructions given.  Consults: None  Significant Diagnostic Studies: None  Treatments: surgery: L 45 plif with pedicle screws  Discharge Exam: Blood pressure 128/77, pulse 90, temperature 98.8 F (37.1 C), temperature source Oral, resp. rate 18, height 5\' 8"  (1.727 m), weight 88.451 kg (195 lb), SpO2 96.00%. Incision/Wound:clean and dry  Disposition: 01-Home or Self Care     Medication List    ASK your doctor about these medications       aspirin 81 MG tablet  Take 81 mg by mouth daily.     cholecalciferol 1000 UNITS tablet  Commonly known as:  VITAMIN D  Take 2,000 Units by mouth daily.     DRY EYES OP  Place 1 drop into both eyes as needed (dry eyes).     hydrochlorothiazide 25 MG tablet  Commonly known as:  HYDRODIURIL  Take 25 mg by mouth daily.     lovastatin 10 MG tablet  Commonly known as:  MEVACOR  Take 10 mg by mouth at bedtime.     metFORMIN 1000 MG tablet  Commonly known as:  GLUCOPHAGE  Take 1,000 mg by mouth daily with breakfast.     Oxycodone HCl 10 MG Tabs  Take 10 mg by mouth every 6 (six) hours as needed (for pain).     QUEtiapine 300 MG tablet  Commonly known as:  SEROQUEL  Take 300 mg by mouth at bedtime.     quinapril 20 MG tablet  Commonly known as:  ACCUPRIL  Take 20 mg by mouth at bedtime.     ranitidine 150 MG tablet  Commonly known as:  ZANTAC  Take 150 mg by mouth 2 (two) times daily.         At home rest most of the time. Get up 9 or 10 times  each day and take a 15 or 20 minute walk. No riding in the car and to your first postoperative appointment. If you have neck surgery you may shower from the chest down starting on the third postoperative day. If you had back surgery he may start showering on the third postoperative day with saran wrap wrapped around your incisional area 3 times. After the shower remove the saran wrap. Take pain medicine as needed and other medications as instructed. Call my office for an appointment.  Signed: Faythe Ghee, MD 05/06/2013, 8:52 AM

## 2013-05-06 NOTE — Care Management Note (Signed)
    Page 1 of 1   05/06/2013     10:39:58 AM CARE MANAGEMENT NOTE 05/06/2013  Patient:  Ashlee Bruce, Ashlee Bruce   Account Number:  [id number]  Date Initiated:  05/06/2013  Documentation initiated by:  Lorne Skeens  Subjective/Objective Assessment:   patient admitted for PLIF.  Lives at home alone.     Action/Plan:   Will follow for discharge needs pending PT/OT evals and physician orders.   Anticipated DC Date:  05/06/2013   Anticipated DC Plan:  [place]  CM consult      Choice offered to / List presented to:     DME arranged  3-N-1  [place].        Status of service:  Completed, signed off Medicare Important Message given?  YES (If response is "NO", the following Medicare IM given date fields will be blank) Date Medicare IM given:   Date Additional Medicare IM given:  05/06/2013  Discharge Disposition:  HOME/SELF CARE  Per UR Regulation:  Reviewed for med. necessity/level of care/duration of stay  If discussed at [place] of Stay Meetings, dates discussed:    Comments:  05/06/13 [place], MSN, CM- Met with patient to discuss home health needs.  [facility] DME notified of need for equipment prior to discharge today.  Order noted for [facility] aide.  Met with patient to discuss order and notified her that insurance will not cover a [place] aide when no other home health needs exist.  Patient verbalized understanding.  Private duty agency list was provided. Medicare IM letter given.

## 2013-05-06 NOTE — Progress Notes (Signed)
Utilization review completed.  

## 2013-06-28 NOTE — OR Nursing (Signed)
Addendum to scope page 

## 2013-07-30 ENCOUNTER — Emergency Department: Payer: Self-pay | Admitting: Emergency Medicine

## 2013-08-06 DIAGNOSIS — B009 Herpesviral infection, unspecified: Secondary | ICD-10-CM | POA: Insufficient documentation

## 2013-10-15 ENCOUNTER — Emergency Department: Payer: Self-pay | Admitting: Internal Medicine

## 2013-12-27 ENCOUNTER — Emergency Department: Payer: Self-pay | Admitting: Emergency Medicine

## 2014-01-06 DIAGNOSIS — M7061 Trochanteric bursitis, right hip: Secondary | ICD-10-CM | POA: Insufficient documentation

## 2014-06-18 ENCOUNTER — Emergency Department
Admission: EM | Admit: 2014-06-18 | Discharge: 2014-06-18 | Disposition: A | Discharge status: Home / Self Care | Payer: Medicare Other | Attending: Student | Admitting: Student

## 2014-06-18 ENCOUNTER — Emergency Department: Payer: Medicare Other

## 2014-06-18 ENCOUNTER — Ambulatory Visit: Payer: Medicare Other | Admitting: Family Medicine

## 2014-06-18 ENCOUNTER — Encounter: Payer: Self-pay | Admitting: Emergency Medicine

## 2014-06-18 DIAGNOSIS — M25551 Pain in right hip: Secondary | ICD-10-CM | POA: Diagnosis present

## 2014-06-18 DIAGNOSIS — G8929 Other chronic pain: Secondary | ICD-10-CM | POA: Insufficient documentation

## 2014-06-18 DIAGNOSIS — E119 Type 2 diabetes mellitus without complications: Secondary | ICD-10-CM | POA: Insufficient documentation

## 2014-06-18 DIAGNOSIS — M542 Cervicalgia: Secondary | ICD-10-CM | POA: Diagnosis not present

## 2014-06-18 DIAGNOSIS — I10 Essential (primary) hypertension: Secondary | ICD-10-CM | POA: Insufficient documentation

## 2014-06-18 DIAGNOSIS — Z79899 Other long term (current) drug therapy: Secondary | ICD-10-CM | POA: Insufficient documentation

## 2014-06-18 MED ORDER — KETOROLAC TROMETHAMINE 60 MG/2ML IM SOLN
INTRAMUSCULAR | Status: AC
  Administered 2014-06-18: 60 mg via INTRAMUSCULAR
  Filled 2014-06-18: qty 2

## 2014-06-18 MED ORDER — KETOROLAC TROMETHAMINE 10 MG PO TABS: 10.0000 mg | ORAL_TABLET | Freq: Three times a day (TID) | ORAL | Status: DC

## 2014-06-18 MED ORDER — ORPHENADRINE CITRATE 30 MG/ML IJ SOLN
INTRAMUSCULAR | Status: AC
  Administered 2014-06-18: 60 mg via INTRAMUSCULAR
  Filled 2014-06-18: qty 2

## 2014-06-18 MED ORDER — ORPHENADRINE CITRATE 30 MG/ML IJ SOLN
60.0000 mg | INTRAMUSCULAR | Status: AC
  Administered 2014-06-18: 60 mg via INTRAMUSCULAR

## 2014-06-18 MED ORDER — DIAZEPAM 2 MG PO TABS: 2.0000 mg | ORAL_TABLET | Freq: Three times a day (TID) | ORAL | Status: DC | PRN

## 2014-06-18 MED ORDER — KETOROLAC TROMETHAMINE 60 MG/2ML IM SOLN
60.0000 mg | Freq: Once | INTRAMUSCULAR | Status: AC
  Administered 2014-06-18: 60 mg via INTRAMUSCULAR

## 2014-06-18 NOTE — Discharge Instructions (Signed)
Cervical Sprain A cervical sprain is when the tissues (ligaments) that hold the neck bones in place stretch or tear. HOME CARE   Put ice on the injured area.  Put ice in a plastic bag.  Place a towel between your skin and the bag.  Leave the ice on for 15-20 minutes, 3-4 times a day.  You may have been given a collar to wear. This collar keeps your neck from moving while you heal.  Do not take the collar off unless told by your doctor.  If you have long hair, keep it outside of the collar.  Ask your doctor before changing the position of your collar. You may need to change its position over time to make it more comfortable.  If you are allowed to take off the collar for cleaning or bathing, follow your doctor's instructions on how to do it safely.  Keep your collar clean by wiping it with mild soap and water. Dry it completely. If the collar has removable pads, remove them every 1-2 days to hand wash them with soap and water. Allow them to air dry. They should be dry before you wear them in the collar.  Do not drive while wearing the collar.  Only take medicine as told by your doctor.  Keep all doctor visits as told.  Keep all physical therapy visits as told.  Adjust your work station so that you have good posture while you work.  Avoid positions and activities that make your problems worse.  Warm up and stretch before being active. GET HELP IF:  Your pain is not controlled with medicine.  You cannot take less pain medicine over time as planned.  Your activity level does not improve as expected. GET HELP RIGHT AWAY IF:   You are bleeding.  Your stomach is upset.  You have an allergic reaction to your medicine.  You develop new problems that you cannot explain.  You lose feeling (become numb) or you cannot move any part of your body (paralysis).  You have tingling or weakness in any part of your body.  Your symptoms get worse. Symptoms include:  Pain,  soreness, stiffness, puffiness (swelling), or a burning feeling in your neck.  Pain when your neck is touched.  Shoulder or upper back pain.  Limited ability to move your neck.  Headache.  Dizziness.  Your hands or arms feel week, lose feeling, or tingle.  Muscle spasms.  Difficulty swallowing or chewing. MAKE SURE YOU:   Understand these instructions.  Will watch your condition.  Will get help right away if you are not doing well or get worse. Document Released: 06/08/2007 Document Revised: 08/22/2012 Document Reviewed: 06/27/2012 [facility] Patient Information 2015 [place], [place]. This information is not intended to replace advice given to you by your health care provider. Make sure you discuss any questions you have with your health care provider.  Take the prescription meds as directed.  Follow-up with Dr. Hal Neer as directed.

## 2014-06-18 NOTE — ED Notes (Signed)
Pt with c/o right neck and hip pain. Neck pain started Sunday, thinks she has a crick in her neck and hip pain is chronic. Pt ambulated to stat desk without any difficulty noted.

## 2014-06-18 NOTE — ED Provider Notes (Signed)
[facility] Emergency Department Provider Note ____________________________________________  Time seen: 1028  I have reviewed the triage vital signs and the nursing notes.  HISTORY  Chief Complaint Hip Pain  HPI Ashlee Bruce is a 59 y.o. female female who reports to the ED for evaluation management of right-sided neck pain that started on Sunday, about 3 days prior to arrival. She denies known injury or trauma, describing the pain as if she has a "crick in her neck". She describes the pain as a continued throbbing pain, that seems to be worse with movement of the neck. She describes it as shooting down the spine and somewhat and tender to palpation at the back of the base of the neck. She denies any distal paresthesias, headache pain, or grip changes. She is here because the pain is similar to the pain that she had nearly a year ago that led to her having a cervical spine fusion surgery.She rates her pain at a 10 out of 10 in triage. She also notes her chronic hip pain at baseline.  Past Medical History  Diagnosis Date  . Hypertension   . Depression   . Diabetes mellitus without complication   . GERD (gastroesophageal reflux disease)   . Transfusion history     with knee replacement  . Shortness of breath     with exertion  . Bipolar disorder    Patient Active Problem List   Diagnosis Date Noted  . Spondylolisthesis at L4-L5 level 05/03/2013   Past Surgical History  Procedure Laterality Date  . Cholecystectomy    . Breast surgery      LEFT   PRECANCER CELLS REMOVED   2009    . Knee arthroplasty      RIGHT  2007     . Thyroid surgery      NODULE REMOVED   1989   . Ectopic pregnancy surgery      TUBE REMOVED  . Wrist surgery      LEFT     20 10- fracture  . Anterior cervical decomp/discectomy fusion N/A 07/26/2012    Procedure: ANTERIOR CERVICAL DECOMPRESSION/DISCECTOMY FUSION 2 LEVELS;  Surgeon: Faythe Ghee, MD;  Location: [facility];   Service: Neurosurgery;  Laterality: N/A;  Cervical Five-Six Cervical Six-Seven  Anterior cervical decompression/diskectomy/fusion/plate (class 4- fly in room)  . Joint replacement Right 2007  . Knee arthroscopy      Current Outpatient Rx  Name  Route  Sig  Dispense  Refill  . Artificial Tear Ointment (DRY EYES OP)   Both Eyes   Place 1 drop into both eyes as needed (dry eyes).         . diazepam (VALIUM) 2 MG tablet   Oral   Take 1 tablet (2 mg total) by mouth every 8 (eight) hours as needed for muscle spasms.   10 tablet   0   . hydrochlorothiazide (HYDRODIURIL) 25 MG tablet   Oral   Take 25 mg by mouth daily.         Marland Kitchen ketorolac (TORADOL) 10 MG tablet   Oral   Take 1 tablet (10 mg total) by mouth every 8 (eight) hours.   15 tablet   0   . lovastatin (MEVACOR) 10 MG tablet   Oral   Take 10 mg by mouth at bedtime.         . metFORMIN (GLUCOPHAGE) 1000 MG tablet   Oral   Take 1,000 mg by mouth daily with breakfast.         .  Oxycodone HCl (ROXICODONE) 10 MG TABS   Oral   Take 1 tablet (10 mg total) by mouth every 4 (four) hours as needed for severe pain.   65 tablet   0   . Oxycodone HCl 10 MG TABS   Oral   Take 10 mg by mouth every 6 (six) hours as needed (for pain).         . QUEtiapine (SEROQUEL) 300 MG tablet   Oral   Take 300 mg by mouth at bedtime.         . quinapril (ACCUPRIL) 20 MG tablet   Oral   Take 20 mg by mouth at bedtime.         . ranitidine (ZANTAC) 150 MG tablet   Oral   Take 150 mg by mouth 2 (two) times daily.          Allergies Review of patient's allergies indicates no known allergies.  No family history on file.  Social History History  Substance Use Topics  . Smoking status: Never Smoker   . Smokeless tobacco: Not on file  . Alcohol Use: 0.6 oz/week    1 Cans of beer per week     Comment: actually 1 every 2 week   Review of Systems  Constitutional: Negative for fever. Eyes: Negative for visual  changes. ENT: Negative for sore throat. Cardiovascular: Negative for chest pain. Respiratory: Negative for shortness of breath. Gastrointestinal: Negative for abdominal pain, vomiting and diarrhea. Genitourinary: Negative for dysuria. Musculoskeletal:Positive for neck & hip pain. Skin: Negative for rash. Neurological: Negative for headaches, focal weakness or numbness. ____________________________________________  PHYSICAL EXAM:  VITAL SIGNS: ED Triage Vitals  Enc Vitals Group     BP 06/18/14 0849 144/95 mmHg     Pulse Rate 06/18/14 0849 81     Resp 06/18/14 0849 20     Temp 06/18/14 0849 97.6 F (36.4 C)     Temp Source 06/18/14 0849 Oral     SpO2 06/18/14 0849 99 %     Weight 06/18/14 0849 196 lb (88.905 kg)     Height 06/18/14 0849 5\' 8"  (1.727 m)     Head Cir --      Peak Flow --      Pain Score 06/18/14 0850 10     Pain Loc --      Pain Edu? --      Excl. in [place]? --    Constitutional: Alert and oriented. Well appearing and in no distress. HEENT: Normocephalic and atraumatic. Conjunctivae are normal. PERRL. Normal extraocular movements. Hematological/Lymphatic/Immunilogical: No cervical lymphadenopathy. Cardiovascular: Normal rate, regular rhythm.  Respiratory: Normal respiratory effort.No wheezes/rales/rhonchi. Gastrointestinal: Soft and nontender. No distention. Musculoskeletal: Cervical spine with normal alignment without spasm, step-off, or deformity. Nontender with normal range of motion in all extremities. Normal gait without antalgia. No lower extremity tenderness nor edema. Neurologic:  Normal speech and language. No gross focal neurologic deficits are appreciated. CN II-XII grossly intact.  Skin:  Skin is warm, dry and intact. No rash noted. Psychiatric: Mood and affect are normal. Patient exhibits appropriate insight and judgment. ____________________________________________   RADIOLOGY  Cervical Spine  IMPRESSION: Postoperative change and essentially  stable areas of arthropathy. Note that there is fairly extensive bridging calcification anteriorly between C4 and C5, not present previously. No fracture or spondylolisthesis. No erosive change or bony destruction. ____________________________________________   PROCEDURES  Toradol 60 mg IM Norflex 60 mg IM ____________________________________________  INITIAL IMPRESSION / ASSESSMENT AND PLAN / ED COURSE  Acute  on chronic cervical spine strain. No neuromuscular deficits. Radiology results to patient. Suggest follow-up with Dr. Hal Neer for ongoing symptoms. Patient given Toradol and Valium for acute pain relief.   ____________________________________________  FINAL CLINICAL IMPRESSION(S) / ED DIAGNOSES  Final diagnoses:  Posterior neck pain     Melvenia Needles, PA-C 06/18/14 1613  Joanne Gavel, MD 06/24/14 [phone]

## 2014-06-19 ENCOUNTER — Ambulatory Visit (INDEPENDENT_AMBULATORY_CARE_PROVIDER_SITE_OTHER): Payer: Medicare Other | Admitting: Family Medicine

## 2014-06-19 ENCOUNTER — Ambulatory Visit: Payer: Medicare Other | Admitting: Family Medicine

## 2014-06-19 ENCOUNTER — Encounter: Payer: Self-pay | Admitting: Family Medicine

## 2014-06-19 VITALS — BP 128/72 | HR 88 | Temp 97.6°F | Resp 16 | Wt 197.6 lb

## 2014-06-19 DIAGNOSIS — E119 Type 2 diabetes mellitus without complications: Secondary | ICD-10-CM

## 2014-06-19 DIAGNOSIS — E1169 Type 2 diabetes mellitus with other specified complication: Secondary | ICD-10-CM | POA: Insufficient documentation

## 2014-06-19 DIAGNOSIS — G8929 Other chronic pain: Secondary | ICD-10-CM

## 2014-06-19 DIAGNOSIS — E785 Hyperlipidemia, unspecified: Secondary | ICD-10-CM | POA: Diagnosis not present

## 2014-06-19 DIAGNOSIS — H04123 Dry eye syndrome of bilateral lacrimal glands: Secondary | ICD-10-CM | POA: Diagnosis not present

## 2014-06-19 DIAGNOSIS — H04129 Dry eye syndrome of unspecified lacrimal gland: Secondary | ICD-10-CM | POA: Insufficient documentation

## 2014-06-19 DIAGNOSIS — M549 Dorsalgia, unspecified: Secondary | ICD-10-CM | POA: Diagnosis not present

## 2014-06-19 DIAGNOSIS — I10 Essential (primary) hypertension: Secondary | ICD-10-CM | POA: Diagnosis not present

## 2014-06-19 LAB — POCT GLYCOSYLATED HEMOGLOBIN (HGB A1C): Hemoglobin A1C: 6.7

## 2014-06-19 MED ORDER — HYPROMELLOSE (GONIOSCOPIC) 2.5 % OP SOLN: 1.0000 [drp] | Freq: Three times a day (TID) | OPHTHALMIC | Status: DC | PRN

## 2014-06-19 MED ORDER — HYDROCHLOROTHIAZIDE 25 MG PO TABS: 25.0000 mg | ORAL_TABLET | Freq: Every day | ORAL | Status: DC

## 2014-06-19 MED ORDER — QUINAPRIL HCL 20 MG PO TABS: 20.0000 mg | ORAL_TABLET | Freq: Every day | ORAL | Status: DC

## 2014-06-19 MED ORDER — METFORMIN HCL 1000 MG PO TABS: 1000.0000 mg | ORAL_TABLET | Freq: Every day | ORAL | Status: DC

## 2014-06-19 NOTE — Assessment & Plan Note (Signed)
Controlled today in the office. Renewed medications. Reviewed DASH diet. Encouraged exercise and checking BP at home. CMP ordered.

## 2014-06-19 NOTE — Assessment & Plan Note (Signed)
Pt is enrolled in chronic pain with both [facility] and [facility]. She is requesting a new pain provider and help with medication today. Pt was advised that this office does not prescribe chronic pain medication. IF she wants to switch pain providers she will be better served following with [facility] until she can get into a new provider. She will ascertain if this office or [facility] can place referral to new provider.

## 2014-06-19 NOTE — Progress Notes (Signed)
Subjective:    Patient ID: Ashlee Bruce, female    DOB: 11/02/1955, 59 y.o.   MRN: [id number]  HPI: Ashlee Bruce is a 59 y.o. female presenting on 06/19/2014 for Diabetes; Hypertension; and Hyperlipidemia  Pt would also like to talk about chronic pain management today. She is unhappy with the care at [facility] and would like help obtaining opioid medications until she can get into a new provider.   She is also requesting a refill on artifical tears.  Diabetes She presents for her follow-up diabetic visit. She has type 2 diabetes mellitus. Her disease course has been fluctuating. There are no hypoglycemic associated symptoms. Pertinent negatives for hypoglycemia include no dizziness or headaches. Pertinent negatives for diabetes include no blurred vision, no chest pain, no fatigue, no foot paresthesias, no polydipsia, no polyphagia, no polyuria and no visual change. There are no hypoglycemic complications. She is following a diabetic diet. She has not had a previous visit with a dietitian. She rarely participates in exercise. Her overall blood glucose range is 110-130 mg/dl. An ACE inhibitor/angiotensin II receptor blocker is being taken. She does not see a podiatrist.Eye exam is current.  Hypertension This is a chronic problem. The current episode started more than 1 year ago. The problem has been gradually improving since onset. The problem is controlled. Pertinent negatives include no blurred vision, chest pain, headaches, palpitations, peripheral edema or shortness of breath. Past treatments include ACE inhibitors and diuretics. The current treatment provides moderate improvement.  Hyperlipidemia This is a chronic problem. The current episode started more than 1 year ago. Pertinent negatives include no chest pain or shortness of breath. Current antihyperlipidemic treatment includes statins. The current treatment provides moderate improvement of lipids.    Past Medical History  Diagnosis Date   . Hypertension   . Depression   . Diabetes mellitus without complication   . GERD (gastroesophageal reflux disease)   . Transfusion history     with knee replacement  . Shortness of breath     with exertion  . Bipolar disorder     Current Outpatient Prescriptions on File Prior to Visit  Medication Sig  . Artificial Tear Ointment (DRY EYES OP) Place 1 drop into both eyes as needed (dry eyes).  . diazepam (VALIUM) 2 MG tablet Take 1 tablet (2 mg total) by mouth every 8 (eight) hours as needed for muscle spasms.  Marland Kitchen ketorolac (TORADOL) 10 MG tablet Take 1 tablet (10 mg total) by mouth every 8 (eight) hours.  . Oxycodone HCl (ROXICODONE) 10 MG TABS Take 1 tablet (10 mg total) by mouth every 4 (four) hours as needed for severe pain.  . Oxycodone HCl 10 MG TABS Take 10 mg by mouth every 6 (six) hours as needed (for pain).   No current facility-administered medications on file prior to visit.    Review of Systems  Constitutional: Negative for fever, chills and fatigue.  HENT: Negative.  Negative for sore throat and trouble swallowing.   Eyes: Negative for blurred vision, photophobia and visual disturbance.  Respiratory: Negative for shortness of breath.   Cardiovascular: Negative for chest pain, palpitations and leg swelling.  Gastrointestinal: Negative for abdominal pain and abdominal distention.  Endocrine: Negative for cold intolerance, heat intolerance, polydipsia, polyphagia and polyuria.  Genitourinary: Negative.   Musculoskeletal: Positive for back pain and neck stiffness.  Neurological: Negative for dizziness, numbness and headaches.  Psychiatric/Behavioral: Negative.    Per HPI unless specifically indicated above     Objective:  BP 128/72 mmHg  Pulse 88  Temp(Src) 97.6 F (36.4 C) (Oral)  Resp 16  Wt 197 lb 9.6 oz (89.631 kg)  Wt Readings from Last 3 Encounters:  06/19/14 197 lb 9.6 oz (89.631 kg)  05/18/13 193 lb (87.544 kg)  06/18/14 196 lb (88.905 kg)     Physical Exam  Constitutional: She is oriented to person, place, and time. She appears well-developed and well-nourished. No distress.  Neck: Normal range of motion. Neck supple. No thyromegaly present.  Cardiovascular: Normal rate and regular rhythm.  Exam reveals no gallop and no friction rub.   No murmur heard. Pulmonary/Chest: Effort normal and breath sounds normal.  Abdominal: Soft. Bowel sounds are normal. There is no tenderness. There is no rebound.  Musculoskeletal: She exhibits no edema or tenderness.  Lymphadenopathy:    She has no cervical adenopathy.  Neurological: She is alert and oriented to person, place, and time.  Skin: Skin is warm and dry. She is not diaphoretic.   Diabetic Foot Exam - Simple   Simple Foot Form  Diabetic Foot exam was performed with the following findings:  Yes 06/19/2014  1:58 PM  Visual Inspection  No deformities, no ulcerations, no other skin breakdown bilaterally:  Yes  Sensation Testing  Intact to touch and monofilament testing bilaterally:  Yes  Pulse Check  Posterior Tibialis and Dorsalis pulse intact bilaterally:  Yes  Comments     Results for orders placed or performed in visit on 06/19/14  POCT HgB A1C  Result Value Ref Range   Hemoglobin A1C 6.7       Assessment & Plan:   Problem List Items Addressed This Visit      Cardiovascular and Mediastinum   Essential (primary) hypertension    Controlled today in the office. Renewed medications. Reviewed DASH diet. Encouraged exercise and checking BP at home. CMP ordered.      Relevant Medications   quinapril (ACCUPRIL) 20 MG tablet   hydrochlorothiazide (HYDRODIURIL) 25 MG tablet   Other Relevant Orders   Comprehensive Metabolic Panel (CMET)     Endocrine   Diabetes mellitus, type 2 - Primary    Continue 1000 mg metformin daily. Encouraged adherence to DM diet. Foot exam done. Eye exam UTD- done at [facility].       Relevant Medications   metFORMIN (GLUCOPHAGE) 1000 MG tablet    quinapril (ACCUPRIL) 20 MG tablet   Other Relevant Orders   POCT HgB A1C (Completed)   Lipid Profile     Other   Back pain, chronic    Pt is enrolled in chronic pain with both [facility] and [facility]. She is requesting a new pain provider and help with medication today. Pt was advised that this office does not prescribe chronic pain medication. IF she wants to switch pain providers she will be better served following with [facility] until she can get into a new provider. She will ascertain if this office or [facility] can place referral to new provider.       Relevant Medications   HYDROcodone-acetaminophen (VICODIN) 5-500 MG per tablet   meloxicam (MOBIC) 7.5 MG tablet   oxyCODONE-acetaminophen (PERCOCET/ROXICET) 5-325 MG per tablet   Hyperlipidemia associated with type 2 diabetes mellitus   Relevant Medications   metFORMIN (GLUCOPHAGE) 1000 MG tablet   quinapril (ACCUPRIL) 20 MG tablet   hydrochlorothiazide (HYDRODIURIL) 25 MG tablet   Chronically dry eyes   Relevant Medications   hydroxypropyl methylcellulose / hypromellose (ISOPTO TEARS / GONIOVISC) 2.5 % ophthalmic solution  Meds ordered this encounter  Medications  . HYDROcodone-acetaminophen (VICODIN) 5-500 MG per tablet    Sig:   . hydrOXYzine (ATARAX/VISTARIL) 50 MG tablet    Sig: Take by mouth.  . lidocaine (XYLOCAINE) 5 % ointment    Sig: Apply topically.  . meloxicam (MOBIC) 7.5 MG tablet    Sig: Take by mouth.  . oxyCODONE-acetaminophen (PERCOCET/ROXICET) 5-325 MG per tablet    Sig:   . DISCONTD: lovastatin (MEVACOR) 10 MG tablet    Sig:   . DISCONTD: quinapril (ACCUPRIL) 20 MG tablet    Sig: Take by mouth.  . DISCONTD: QUEtiapine (SEROQUEL XR) 50 MG TB24 24 hr tablet    Sig:   . DISCONTD: ranitidine (ZANTAC) 150 MG tablet    Sig:   . glucose blood (ONE TOUCH ULTRA TEST) test strip    Sig:   . metFORMIN (GLUCOPHAGE) 1000 MG tablet    Sig: Take 1 tablet (1,000 mg total) by mouth daily with breakfast.    Dispense:  90 tablet     Refill:  3    Order Specific Question:  Supervising Provider    Answer:  Arlis Porta [phone]  . quinapril (ACCUPRIL) 20 MG tablet    Sig: Take 1 tablet (20 mg total) by mouth at bedtime.    Dispense:  90 tablet    Refill:  3    Order Specific Question:  Supervising Provider    Answer:  Arlis Porta [phone]  . hydrochlorothiazide (HYDRODIURIL) 25 MG tablet    Sig: Take 1 tablet (25 mg total) by mouth daily.    Dispense:  90 tablet    Refill:  3    Order Specific Question:  Supervising Provider    Answer:  Arlis Porta [phone]  . hydroxypropyl methylcellulose / hypromellose (ISOPTO TEARS / GONIOVISC) 2.5 % ophthalmic solution    Sig: Place 1 drop into both eyes 3 (three) times daily as needed for dry eyes.    Dispense:  15 mL    Refill:  12    Order Specific Question:  Supervising Provider    Answer:  Arlis Porta [phone]      Follow up plan: Return in about 5 months (around 11/19/2014) for dm, htn.

## 2014-06-19 NOTE — Assessment & Plan Note (Addendum)
Continue 1000 mg metformin daily. Encouraged adherence to DM diet. Foot exam done. Eye exam UTD- done at [facility].

## 2014-06-19 NOTE — Patient Instructions (Signed)
Chronic back pain: Please call [facility] and let them know you want to switch pain providers. They may be able to refer you. If you need [place] to refer you, we are happy to do so. I recommend keeping your appts with [facility] as I am unable to prescribe you pain medications due to your pain contract. We also do not manage chronic pain in this office.  HTN: Your goal blood pressure is 140/90- please check BP at least once per week. Work on low salt/sodium diet - goal <1.5gm (1,500mg ) per day. Eat a diet high in fruits/vegetables and whole grains.  Look into mediterranean and DASH diet. Goal activity is 180min/wk of moderate intensity exercise.  This can be split into 30 minute chunks.  If you are not at this level, you can start with smaller 10-15 min increments and slowly build up activity. Look at [employer] for more resources  Diabetes: Continue metformin 1000mg  daily. Avoid excess sugar in your diet. Goal exercise is 150 minutes per week. This will help with weight loss and controlling your DM.   Please seek immediate medical attention at ER or Urgent Care if you develop: Chest pain, pressure or tightness. Shortness of breath accompanied by nausea or diaphoresis Visual changes Numbness or tingling on one side of the body Facial droop Altered mental status Or any concerning symptoms.

## 2014-06-24 ENCOUNTER — Ambulatory Visit: Payer: Medicare Other | Admitting: Family Medicine

## 2014-06-24 ENCOUNTER — Telehealth: Payer: Self-pay

## 2014-06-24 NOTE — Telephone Encounter (Signed)
Staff Please inform me of the reason patient was discharged from clinic and we may consider seeing patient.. I need to review reason for discharge and patient's prior records prior to deciding if we want to schedule patient for appointment

## 2014-06-24 NOTE — Telephone Encounter (Signed)
Pt really wants to come back to Pain Clinic due to having pain. Pt expressed feelings of regret for being discharged. Pt would like to speak to you (Dr. Primus Bravo) personally.

## 2014-06-24 NOTE — Telephone Encounter (Signed)
Pt was discharged from clinic two years ago. Pt would like to come back and she wants to speak with you (Dr. Primus Bravo) personally.  Pt telephone number [phone]

## 2014-06-24 NOTE — Telephone Encounter (Signed)
I looked pt up in [facility] and found the note where pt had been receiving meds from several different doctors. I informed Dr Primus Bravo and Dr Primus Bravo stated he would not be able to see this patient. I called the patient and informed her that Dr Primus Bravo would not be able to see her .

## 2014-07-30 ENCOUNTER — Telehealth: Payer: Self-pay | Admitting: Family Medicine

## 2014-07-30 NOTE — Telephone Encounter (Signed)
Pt said she was off the mobic now so she needed a prescription for ibuprofen sent to [facility].  Her call back number is [phone]

## 2014-07-31 MED ORDER — IBUPROFEN 600 MG PO TABS: 600.0000 mg | ORAL_TABLET | Freq: Three times a day (TID) | ORAL | Status: AC | PRN

## 2014-07-31 NOTE — Telephone Encounter (Signed)
Sent to pharmacy on file 

## 2014-08-18 ENCOUNTER — Ambulatory Visit (INDEPENDENT_AMBULATORY_CARE_PROVIDER_SITE_OTHER): Payer: Medicare Other | Admitting: Family Medicine

## 2014-08-18 ENCOUNTER — Encounter: Payer: Self-pay | Admitting: Family Medicine

## 2014-08-18 VITALS — BP 132/77 | HR 88 | Temp 97.4°F | Resp 16 | Ht 68.0 in | Wt 200.4 lb

## 2014-08-18 DIAGNOSIS — K625 Hemorrhage of anus and rectum: Secondary | ICD-10-CM | POA: Diagnosis not present

## 2014-08-18 DIAGNOSIS — N390 Urinary tract infection, site not specified: Secondary | ICD-10-CM

## 2014-08-18 DIAGNOSIS — N939 Abnormal uterine and vaginal bleeding, unspecified: Secondary | ICD-10-CM

## 2014-08-18 DIAGNOSIS — R8281 Pyuria: Secondary | ICD-10-CM

## 2014-08-18 LAB — POCT URINALYSIS DIPSTICK
Bilirubin, UA: NEGATIVE
GLUCOSE UA: NEGATIVE
KETONES UA: NEGATIVE
Nitrite, UA: NEGATIVE
Spec Grav, UA: 1.01
UROBILINOGEN UA: NEGATIVE
pH, UA: 6

## 2014-08-18 LAB — POC HEMOCCULT BLD/STL (OFFICE/1-CARD/DIAGNOSTIC): Fecal Occult Blood, POC: POSITIVE — AB

## 2014-08-18 NOTE — Progress Notes (Signed)
Subjective:    Patient ID: Ashlee Bruce, female    DOB: [birth year], 59 y.o.   MRN: [id number]  HPI: Ashlee Bruce is a 59 y.o. female presenting on 08/18/2014 for Acute Visit   Vaginal Bleeding The patient's primary symptoms include genital itching and vaginal bleeding. The patient's pertinent negatives include no genital lesions, genital odor, genital rash or pelvic pain. This is a new problem. The current episode started in the past 7 days. The problem occurs 2 to 4 times per day. The problem has been unchanged. She is not pregnant. Pertinent negatives include no abdominal pain, back pain, chills, constipation, diarrhea, dysuria, fever, flank pain, hematuria, nausea, painful intercourse, urgency or vomiting.    Pt presents for vaginal bleeding. BLeeding started on Sunday. She last had intercourse on Saturday. She is unsure if it coming from the rectum or a vagina. She has been wiping separately and she believes it is coming from the vagina.  It occurs every time she wipes. She reports blood is bright red. She denies issues with hemorrhoids or constipation. Takes colace daily to prevent straining.    Past Medical History  Diagnosis Date  . Hypertension   . Depression   . Diabetes mellitus without complication   . GERD (gastroesophageal reflux disease)   . Transfusion history     with knee replacement  . Shortness of breath     with exertion  . Bipolar disorder     Current Outpatient Prescriptions on File Prior to Visit  Medication Sig  . acyclovir (ZOVIRAX) 800 MG tablet Take 1 tablet by mouth daily as needed.  . Artificial Tear Ointment (DRY EYES OP) Place 1 drop into both eyes as needed (dry eyes).  . diazepam (VALIUM) 2 MG tablet Take 1 tablet (2 mg total) by mouth every 8 (eight) hours as needed for muscle spasms.  Marland Kitchen docusate sodium (COLACE) 100 MG capsule Take 1 capsule by mouth 2 (two) times daily as needed.  Marland Kitchen glucose blood (ONE TOUCH ULTRA TEST) test strip   .  hydrochlorothiazide (HYDRODIURIL) 25 MG tablet Take 1 tablet (25 mg total) by mouth daily.  . hydroxypropyl methylcellulose / hypromellose (ISOPTO TEARS / GONIOVISC) 2.5 % ophthalmic solution Place 1 drop into both eyes 3 (three) times daily as needed for dry eyes.  . hydrOXYzine (ATARAX/VISTARIL) 50 MG tablet Take by mouth.  Marland Kitchen ibuprofen (ADVIL,MOTRIN) 600 MG tablet Take 1 tablet (600 mg total) by mouth every 8 (eight) hours as needed.  . lidocaine (XYLOCAINE) 5 % ointment Apply topically.  . metFORMIN (GLUCOPHAGE) 1000 MG tablet Take 1 tablet (1,000 mg total) by mouth daily with breakfast.  . oxyCODONE-acetaminophen (PERCOCET/ROXICET) 5-325 MG per tablet   . quinapril (ACCUPRIL) 20 MG tablet Take 1 tablet (20 mg total) by mouth at bedtime.  . terbinafine (LAMISIL) 250 MG tablet Take 1 tablet by mouth daily.   No current facility-administered medications on file prior to visit.    Review of Systems  Constitutional: Negative for fever and chills.  HENT: Negative.   Respiratory: Negative for cough, chest tightness and wheezing.   Cardiovascular: Negative for chest pain and leg swelling.  Gastrointestinal: Negative for nausea, vomiting, abdominal pain, diarrhea, constipation and blood in stool.  Endocrine: Negative.  Negative for cold intolerance, heat intolerance, polydipsia, polyphagia and polyuria.  Genitourinary: Positive for vaginal bleeding. Negative for dysuria, urgency, hematuria, flank pain, difficulty urinating, vaginal pain and pelvic pain.  Musculoskeletal: Negative.  Negative for back pain.  Neurological: Negative for  dizziness, light-headedness and numbness.  Psychiatric/Behavioral: Negative.    Per HPI unless specifically indicated above     Objective:    BP 132/77 mmHg  Pulse 88  Temp(Src) 97.4 F (36.3 C)  Resp 16  Ht 5\' 8"  (1.727 m)  Wt 200 lb 6.4 oz (90.901 kg)  BMI 30.48 kg/m2  Wt Readings from Last 3 Encounters:  08/18/14 200 lb 6.4 oz (90.901 kg)  06/19/14  197 lb 9.6 oz (89.631 kg)  05/18/13 193 lb (87.544 kg)    Physical Exam  Constitutional: She is oriented to person, place, and time. She appears well-developed and well-nourished.  HENT:  Head: Normocephalic and atraumatic.  Neck: Normal range of motion. Neck supple.  Cardiovascular: Normal rate, regular rhythm and normal heart sounds.  Exam reveals no gallop and no friction rub.   No murmur heard. Pulmonary/Chest: Effort normal and breath sounds normal. She has no wheezes. She exhibits no tenderness.  Abdominal: Soft. Normal appearance and bowel sounds are normal. She exhibits no distension and no mass. There is no tenderness. There is no rebound and no guarding.  Genitourinary: Uterus normal. Rectal exam shows no external hemorrhoid, no internal hemorrhoid, no fissure, no mass, no tenderness and anal tone normal. Guaiac positive stool (weak positive. No blood seen on smear, no hemorrhoids on tenderness. ). There is no tenderness, lesion or injury on the right labia. There is no tenderness, lesion or injury on the left labia. Cervix exhibits no motion tenderness, no discharge and no friability. Right adnexum displays no mass, no tenderness and no fullness. Left adnexum displays no mass, no tenderness and no fullness. No tenderness or bleeding in the vagina. No signs of injury around the vagina. No vaginal discharge found.  Neurological: She is alert and oriented to person, place, and time.  Skin: Skin is warm and dry.   Results for orders placed or performed in visit on 08/18/14  POC Hemoccult Bld/Stl (1-Cd Office Dx)  Result Value Ref Range   Card #1 Date 8/15/206    Fecal Occult Blood, POC Positive (A) Negative  POCT Urinalysis Dipstick  Result Value Ref Range   Color, UA lt yellow    Clarity, UA clear    Glucose, UA neg    Bilirubin, UA neg    Ketones, UA neg    Spec Grav, UA 1.010    Blood, UA mod    pH, UA 6.0    Protein, UA trace    Urobilinogen, UA negative    Nitrite, UA neg     Leukocytes, UA moderate (2+) (A) Negative      Assessment & Plan:   Problem List Items Addressed This Visit    None    Visit Diagnoses    Vaginal bleeding    -  Primary    No sign of bleeding on exam. Suspect bleeding from intercourse- as pt reports vaginal dryness. Due to post-menopausal state will request GYN referral to see if patient is candidate for endometrial biopsy.     Relevant Orders    Ambulatory referral to Obstetrics / Gynecology    Rectal bleeding        No sign of  frank rectal bleeding. Positive hemmocult- unclear if this etiology of pt symptoms. Pt is certain is it vaginal. Work up vaginal bleeding and consider GI referral if continues.     Relevant Orders    POC Hemoccult Bld/Stl (1-Cd Office Dx) (Completed)    Pyuria        Moderate  blood and leuks in urine. Sent for culture.     Relevant Orders    POCT Urinalysis Dipstick (Completed)    CULTURE, URINE COMPREHENSIVE       No orders of the defined types were placed in this encounter.      Follow up plan: Return if symptoms worsen or fail to improve.

## 2014-08-18 NOTE — Patient Instructions (Signed)
Abnormal Uterine Bleeding Abnormal uterine bleeding can affect women at various stages in life, including teenagers, women in their reproductive years, pregnant women, and women who have reached menopause. Several kinds of uterine bleeding are considered abnormal, including:  Bleeding or spotting between periods.   Bleeding after sexual intercourse.   Bleeding that is heavier or more than normal.   Periods that last longer than usual.  Bleeding after menopause.  Many cases of abnormal uterine bleeding are minor and simple to treat, while others are more serious. Any type of abnormal bleeding should be evaluated by your health care provider. Treatment will depend on the cause of the bleeding. HOME CARE INSTRUCTIONS Monitor your condition for any changes. The following actions may help to alleviate any discomfort you are experiencing:  Avoid the use of tampons and douches as directed by your health care provider.  Change your pads frequently. You should get regular pelvic exams and Pap tests. Keep all follow-up appointments for diagnostic tests as directed by your health care provider.  SEEK MEDICAL CARE IF:   Your bleeding lasts more than 1 week.   You feel dizzy at times.  SEEK IMMEDIATE MEDICAL CARE IF:   You pass out.   You are changing pads every 15 to 30 minutes.   You have abdominal pain.  You have a fever.   You become sweaty or weak.   You are passing large blood clots from the vagina.   You start to feel nauseous and vomit. MAKE SURE YOU:   Understand these instructions.  Will watch your condition.  Will get help right away if you are not doing well or get worse. Document Released: 12/20/2004 Document Revised: 12/25/2012 Document Reviewed: 07/19/2012 Exit[employer] Patient Information 2015 [employer]. This information is not intended to replace advice given to you by your health care provider. Make sure you discuss any questions you have with your  health care provider.  

## 2014-08-20 LAB — CULTURE, URINE COMPREHENSIVE

## 2014-08-21 ENCOUNTER — Telehealth: Payer: Self-pay | Admitting: Family Medicine

## 2014-08-21 DIAGNOSIS — N3001 Acute cystitis with hematuria: Secondary | ICD-10-CM

## 2014-08-21 MED ORDER — NITROFURANTOIN MONOHYD MACRO 100 MG PO CAPS: 100.0000 mg | ORAL_CAPSULE | Freq: Two times a day (BID) | ORAL | Status: DC

## 2014-08-21 NOTE — Telephone Encounter (Signed)
LMTCB for patient:  UC from Monday's visit + for E.Coli. We will treat for UTI. Sending over [employer] to pharmacy on file. Take twice daily for 7 days. Have asked her to call and confirm she got message. Would also like to know if she is still having bleeding.

## 2014-08-21 NOTE — Telephone Encounter (Signed)
Patient received msg re: UC and abx. Pt states she has stopped bleeding the next day. Pt has appt with GYN 09/10/14.

## 2014-09-10 ENCOUNTER — Encounter: Payer: Medicare Other | Admitting: Obstetrics and Gynecology

## 2014-09-29 ENCOUNTER — Ambulatory Visit (INDEPENDENT_AMBULATORY_CARE_PROVIDER_SITE_OTHER): Payer: Medicare Other

## 2014-09-29 VITALS — Resp 16 | Ht 68.0 in | Wt 200.0 lb

## 2014-09-29 DIAGNOSIS — Z111 Encounter for screening for respiratory tuberculosis: Secondary | ICD-10-CM

## 2014-10-01 ENCOUNTER — Encounter: Payer: Self-pay | Admitting: Obstetrics and Gynecology

## 2014-10-01 ENCOUNTER — Ambulatory Visit (INDEPENDENT_AMBULATORY_CARE_PROVIDER_SITE_OTHER): Payer: Medicare Other | Admitting: Obstetrics and Gynecology

## 2014-10-01 VITALS — BP 118/83 | HR 71 | Ht 68.0 in | Wt 193.3 lb

## 2014-10-01 DIAGNOSIS — N898 Other specified noninflammatory disorders of vagina: Secondary | ICD-10-CM | POA: Diagnosis not present

## 2014-10-01 DIAGNOSIS — N951 Menopausal and female climacteric states: Secondary | ICD-10-CM

## 2014-10-01 DIAGNOSIS — N950 Postmenopausal bleeding: Secondary | ICD-10-CM | POA: Diagnosis not present

## 2014-10-01 LAB — TB SKIN TEST: TB SKIN TEST: NEGATIVE

## 2014-10-01 NOTE — Progress Notes (Signed)
GYNECOLOGY CLINIC PROGRESS NOTE  Subjective:    Ashlee Bruce is a 59 y.o., post-menopausal female who presents as a referral from [facility] (Amy Krebs, FNP) for concerns regarding postmenopausal vaginal bleeding. She has been menopausal for 13 years. Currently on no HRT . Bleeding is described as scant staining and has occurred 3 times in the past 2-3 months.   Does note prior episode of vaginal spotting ~ at least 1 other time ~ 2 years ago. Other menopausal symptoms include: vaginal dryness (moderate) and vasomotor symptoms began 3 weeks ago, occur 4 times per day, and last about 10 minutes.. Workup to date: none.  Menstrual History: No LMP recorded. Patient is postmenopausal.    Past Medical History  Diagnosis Date  . Hypertension   . Depression   . Diabetes mellitus without complication   . GERD (gastroesophageal reflux disease)   . Transfusion history     with knee replacement  . Shortness of breath     with exertion  . Bipolar disorder     Past Surgical History  Procedure Laterality Date  . Cholecystectomy    . Breast surgery      LEFT   PRECANCER CELLS REMOVED   2009    . Knee arthroplasty      RIGHT  2007     . Thyroid surgery      NODULE REMOVED   1989   . Ectopic pregnancy surgery      TUBE REMOVED  . Wrist surgery      LEFT     2010- fracture  . Anterior cervical decomp/discectomy fusion N/A 07/26/2012    Procedure: ANTERIOR CERVICAL DECOMPRESSION/DISCECTOMY FUSION 2 LEVELS;  Surgeon: Faythe Ghee, MD;  Location: [facility];  Service: Neurosurgery;  Laterality: N/A;  Cervical Five-Six Cervical Six-Seven  Anterior cervical decompression/diskectomy/fusion/plate (class 4- fly in room)  . Joint replacement Right 2007  . Knee arthroscopy      Family History  Problem Relation Age of Onset  . Hypertension Mother      Social History  Substance Use Topics  . Smoking status: Never Smoker   . Smokeless tobacco: Never Used  . Alcohol Use:  0.6 oz/week    1 Cans of beer per week     Comment: actually 1 every 2 week    Outpatient Encounter Prescriptions as of 10/01/2014  Medication Sig Note  . amLODipine (NORVASC) 5 MG tablet Take 5 mg by mouth daily.   . Artificial Tear Ointment (DRY EYES OP) Place 1 drop into both eyes as needed (dry eyes).   Marland Kitchen glucose blood (ONE TOUCH ULTRA TEST) test strip  06/19/2014: Received from: [facility]  . hydrochlorothiazide (HYDRODIURIL) 25 MG tablet Take 1 tablet (25 mg total) by mouth daily.   . metFORMIN (GLUCOPHAGE) 1000 MG tablet Take 1 tablet (1,000 mg total) by mouth daily with breakfast.   . quinapril (ACCUPRIL) 20 MG tablet Take 1 tablet (20 mg total) by mouth at bedtime.   . diazepam (VALIUM) 2 MG tablet Take 1 tablet (2 mg total) by mouth every 8 (eight) hours as needed for muscle spasms. (Patient not taking: Reported on 10/01/2014)   . hydroxypropyl methylcellulose / hypromellose (ISOPTO TEARS / GONIOVISC) 2.5 % ophthalmic solution Place 1 drop into both eyes 3 (three) times daily as needed for dry eyes. (Patient not taking: Reported on 10/01/2014)   . Lovastatin (MEVACOR) 10 MG tablet Take 1 tablet (10 mg) by mouth daily   .  Aspirin EC 81 mg tablet Take 1 tablet (81 mg) by mouth daily     No Known Allergies   Review of Systems A comprehensive review of systems was negative except for: Genitourinary: positive for hot flashes, sexual problems and postmenopausal spotting    Objective:    BP 118/83 mmHg  Pulse 71  Ht 5\' 8"  (1.727 m)  Wt 193 lb 4.8 oz (87.68 kg)  BMI 29.40 kg/m2 General appearance: alert and no distress Abdomen: soft, non-tender; bowel sounds normal; no masses,  no organomegaly Pelvic: external genitalia normal, rectovaginal septum normal and vagina normal without discharge or blood in vault.  Vagina with mild atrophy present. Cervix normal appearing, no lesions, no motion tenderness. Uterus normal size, shape, consistency, mobile, nontender. Adnexae nontender,  nonpalpable. Bladder neck without tenderness.  Urethra with mild atrophy, nontender.  Extremities: extremities normal, atraumatic, no cyanosis or edema Pulses: 2+ and symmetric Skin: Skin color, texture, turgor normal. No rashes or lesions Neurologic: Grossly normal   Assessment:   - Postmenopausal bleeding   - Vasomotor symptoms - Vaginal dryness  Plan:   - Diagnosis explained in detail, including differential.   All questions answered. Pelvic ultrasound ordered to assess endometrial stripe.  If stripe thickened, will perform endometrial biopsy.  Follow up in 1-2 weeks. - To discuss further management of vasomotor symptoms and vaginal dryness at next visit.  Patient notes that she would not prefer hormonal therapy as she has had 2 breast biopsies in the past, and although benign, does not want to increase risk of breast cancer.  Will discuss non-hormonal options.    Rubie Maid, MD [facility].

## 2014-10-14 ENCOUNTER — Ambulatory Visit: Payer: Medicare Other

## 2014-10-14 DIAGNOSIS — N950 Postmenopausal bleeding: Secondary | ICD-10-CM

## 2014-10-16 ENCOUNTER — Ambulatory Visit: Payer: Medicare Other | Admitting: Obstetrics and Gynecology

## 2014-10-16 ENCOUNTER — Other Ambulatory Visit: Payer: Medicare Other

## 2014-10-28 ENCOUNTER — Ambulatory Visit: Payer: Medicare Other | Admitting: Obstetrics and Gynecology

## 2014-10-28 ENCOUNTER — Telehealth: Payer: Self-pay | Admitting: Obstetrics and Gynecology

## 2014-10-28 NOTE — Telephone Encounter (Signed)
PT WAS SCHEDULED TO COME IN FOR REVIEW OF RESULTS, WE CX DUE TO SCHEDULE, I CALLED PT TO LET HER KNOW YOU WILL CALL HER WITH THE RESULTS OF LABS AND [place]

## 2014-10-30 ENCOUNTER — Ambulatory Visit: Payer: Medicare Other | Admitting: Obstetrics and Gynecology

## 2014-10-30 MED ORDER — ESTRADIOL 10 MCG VA TABS: 1.0000 | ORAL_TABLET | Freq: Every day | VAGINAL | Status: DC

## 2014-11-04 NOTE — Telephone Encounter (Signed)
Late Entry:   Returned patient's call on 07/30/2014. Discussed ultrasound results ordered for PMB (thin endometrial stripe noted, 3 mm).  Vaginal bleeding could be due to vaginal atrophy.  On further discussion, patient notes postmenopausal vasomotor symptoms.  Discussed treatment of symptoms, including hormonal and non-hormonal treatments.  Patient desires to try hormonal therapy.  Desires to try vaginal tablet (Vagifem).  Will order.  Patient to f/u in 3-4 weeks for reassessment of symptoms.

## 2015-02-05 DIAGNOSIS — M5416 Radiculopathy, lumbar region: Secondary | ICD-10-CM | POA: Insufficient documentation

## 2015-03-10 ENCOUNTER — Other Ambulatory Visit: Payer: Self-pay | Admitting: Neurosurgery

## 2015-03-10 DIAGNOSIS — M5416 Radiculopathy, lumbar region: Secondary | ICD-10-CM

## 2015-03-20 ENCOUNTER — Ambulatory Visit
Admission: RE | Admit: 2015-03-20 | Discharge: 2015-03-20 | Disposition: A | Discharge status: Home / Self Care | Payer: Medicare Other | Source: Ambulatory Visit | Attending: Neurosurgery | Admitting: Neurosurgery

## 2015-03-20 DIAGNOSIS — M5416 Radiculopathy, lumbar region: Secondary | ICD-10-CM

## 2015-03-20 MED ORDER — GADOBENATE DIMEGLUMINE 529 MG/ML IV SOLN
18.0000 mL | Freq: Once | INTRAVENOUS | Status: AC | PRN
  Administered 2015-03-20: 18 mL via INTRAVENOUS

## 2015-03-26 ENCOUNTER — Other Ambulatory Visit: Payer: Self-pay | Admitting: Neurosurgery

## 2015-03-26 DIAGNOSIS — M5416 Radiculopathy, lumbar region: Secondary | ICD-10-CM

## 2015-03-30 ENCOUNTER — Ambulatory Visit: Payer: Medicare Other | Admitting: Family Medicine

## 2015-04-02 ENCOUNTER — Other Ambulatory Visit: Payer: Medicare Other

## 2015-04-10 ENCOUNTER — Ambulatory Visit
Admission: RE | Admit: 2015-04-10 | Discharge: 2015-04-10 | Disposition: A | Discharge status: Home / Self Care | Payer: Medicare Other | Source: Ambulatory Visit | Attending: Neurosurgery | Admitting: Neurosurgery

## 2015-04-10 VITALS — BP 100/64 | HR 85

## 2015-04-10 DIAGNOSIS — K5900 Constipation, unspecified: Secondary | ICD-10-CM | POA: Insufficient documentation

## 2015-04-10 DIAGNOSIS — E7800 Pure hypercholesterolemia, unspecified: Secondary | ICD-10-CM | POA: Insufficient documentation

## 2015-04-10 DIAGNOSIS — M5416 Radiculopathy, lumbar region: Secondary | ICD-10-CM

## 2015-04-10 DIAGNOSIS — M898X9 Other specified disorders of bone, unspecified site: Secondary | ICD-10-CM | POA: Insufficient documentation

## 2015-04-10 DIAGNOSIS — E559 Vitamin D deficiency, unspecified: Secondary | ICD-10-CM | POA: Insufficient documentation

## 2015-04-10 DIAGNOSIS — J069 Acute upper respiratory infection, unspecified: Secondary | ICD-10-CM | POA: Insufficient documentation

## 2015-04-10 DIAGNOSIS — M9979 Connective tissue and disc stenosis of intervertebral foramina of abdomen and other regions: Secondary | ICD-10-CM | POA: Insufficient documentation

## 2015-04-10 DIAGNOSIS — J302 Other seasonal allergic rhinitis: Secondary | ICD-10-CM | POA: Insufficient documentation

## 2015-04-10 DIAGNOSIS — R300 Dysuria: Secondary | ICD-10-CM | POA: Insufficient documentation

## 2015-04-10 DIAGNOSIS — M4316 Spondylolisthesis, lumbar region: Secondary | ICD-10-CM

## 2015-04-10 DIAGNOSIS — Z111 Encounter for screening for respiratory tuberculosis: Secondary | ICD-10-CM | POA: Insufficient documentation

## 2015-04-10 DIAGNOSIS — Z765 Malingerer [conscious simulation]: Secondary | ICD-10-CM | POA: Insufficient documentation

## 2015-04-10 DIAGNOSIS — S29011A Strain of muscle and tendon of front wall of thorax, initial encounter: Secondary | ICD-10-CM | POA: Insufficient documentation

## 2015-04-10 DIAGNOSIS — Z23 Encounter for immunization: Secondary | ICD-10-CM | POA: Insufficient documentation

## 2015-04-10 DIAGNOSIS — IMO0001 Reserved for inherently not codable concepts without codable children: Secondary | ICD-10-CM | POA: Insufficient documentation

## 2015-04-10 DIAGNOSIS — B029 Zoster without complications: Secondary | ICD-10-CM | POA: Insufficient documentation

## 2015-04-10 DIAGNOSIS — N760 Acute vaginitis: Secondary | ICD-10-CM | POA: Insufficient documentation

## 2015-04-10 DIAGNOSIS — L259 Unspecified contact dermatitis, unspecified cause: Secondary | ICD-10-CM | POA: Insufficient documentation

## 2015-04-10 MED ORDER — IOPAMIDOL (ISOVUE-M 200) INJECTION 41%
15.0000 mL | Freq: Once | INTRAMUSCULAR | Status: AC
  Administered 2015-04-10: 15 mL via INTRATHECAL

## 2015-04-10 MED ORDER — MEPERIDINE HCL 100 MG/ML IJ SOLN
75.0000 mg | Freq: Once | INTRAMUSCULAR | Status: AC
  Administered 2015-04-10: 75 mg via INTRAMUSCULAR

## 2015-04-10 MED ORDER — ONDANSETRON HCL 4 MG/2ML IJ SOLN
4.0000 mg | Freq: Once | INTRAMUSCULAR | Status: AC
  Administered 2015-04-10: 4 mg via INTRAMUSCULAR

## 2015-04-10 MED ORDER — DIAZEPAM 5 MG PO TABS
10.0000 mg | ORAL_TABLET | Freq: Once | ORAL | Status: AC
  Administered 2015-04-10: 10 mg via ORAL

## 2015-04-10 NOTE — Discharge Instructions (Signed)

## 2015-06-03 ENCOUNTER — Other Ambulatory Visit: Payer: Self-pay | Admitting: Family Medicine

## 2015-06-10 ENCOUNTER — Other Ambulatory Visit: Payer: Self-pay | Admitting: Family Medicine

## 2015-08-24 DIAGNOSIS — F1421 Cocaine dependence, in remission: Secondary | ICD-10-CM | POA: Insufficient documentation

## 2015-09-14 ENCOUNTER — Other Ambulatory Visit: Payer: Self-pay | Admitting: Family Medicine

## 2015-10-20 ENCOUNTER — Other Ambulatory Visit: Payer: Self-pay | Admitting: Family Medicine

## 2016-01-13 DIAGNOSIS — N879 Dysplasia of cervix uteri, unspecified: Secondary | ICD-10-CM | POA: Insufficient documentation

## 2016-01-18 ENCOUNTER — Other Ambulatory Visit: Payer: Self-pay | Admitting: Pain Medicine

## 2016-02-15 ENCOUNTER — Other Ambulatory Visit: Payer: Self-pay | Admitting: Pain Medicine

## 2016-03-14 ENCOUNTER — Other Ambulatory Visit: Payer: Self-pay | Admitting: Pain Medicine

## 2017-08-04 ENCOUNTER — Other Ambulatory Visit: Payer: Self-pay

## 2017-08-04 ENCOUNTER — Encounter: Payer: Self-pay | Admitting: Emergency Medicine

## 2017-08-04 ENCOUNTER — Emergency Department
Admission: EM | Admit: 2017-08-04 | Discharge: 2017-08-04 | Disposition: A | Discharge status: Home / Self Care | Payer: Medicare HMO | Attending: Emergency Medicine | Admitting: Emergency Medicine

## 2017-08-04 ENCOUNTER — Emergency Department: Payer: Medicare HMO

## 2017-08-04 DIAGNOSIS — M25512 Pain in left shoulder: Secondary | ICD-10-CM

## 2017-08-04 DIAGNOSIS — Z79899 Other long term (current) drug therapy: Secondary | ICD-10-CM | POA: Insufficient documentation

## 2017-08-04 DIAGNOSIS — E119 Type 2 diabetes mellitus without complications: Secondary | ICD-10-CM | POA: Diagnosis not present

## 2017-08-04 DIAGNOSIS — Z7984 Long term (current) use of oral hypoglycemic drugs: Secondary | ICD-10-CM | POA: Insufficient documentation

## 2017-08-04 DIAGNOSIS — I10 Essential (primary) hypertension: Secondary | ICD-10-CM | POA: Diagnosis not present

## 2017-08-04 DIAGNOSIS — Z7982 Long term (current) use of aspirin: Secondary | ICD-10-CM | POA: Insufficient documentation

## 2017-08-04 MED ORDER — PREDNISONE 50 MG PO TABS: ORAL_TABLET | ORAL | 0 refills | Status: DC

## 2017-08-04 MED ORDER — KETOROLAC TROMETHAMINE 30 MG/ML IJ SOLN
30.0000 mg | Freq: Once | INTRAMUSCULAR | Status: AC
  Administered 2017-08-04: 30 mg via INTRAMUSCULAR
  Filled 2017-08-04: qty 1

## 2017-08-04 MED ORDER — METHOCARBAMOL 500 MG PO TABS
1000.0000 mg | ORAL_TABLET | Freq: Once | ORAL | Status: AC
  Administered 2017-08-04: 1000 mg via ORAL
  Filled 2017-08-04: qty 2

## 2017-08-04 NOTE — ED Notes (Signed)
AAOx3.  Skin warm and dry.  NAD 

## 2017-08-04 NOTE — ED Provider Notes (Signed)
[facility] Emergency Department Provider Note  ____________________________________________  Time seen: Approximately 4:19 PM  I have reviewed the triage vital signs and the nursing notes.   HISTORY  Chief Complaint Shoulder Pain (Left) and Arm Pain (Left)    HPI Ashlee Bruce is a 62 y.o. female presents to the emergency department with 10 out of 10 aching and tingling left shoulder pain that radiates into the left hand.  Patient reports that pain started yesterday without trauma or falls.  Patient reports that she experiences worsening pain with abduction relieved with rest.  Patient does have a history of prior cervical spine surgeries.  She denies weakness.  Patient denies chest pain, chest tightness and shortness of breath.  Patient takes oxycodone 10 for pain.   Past Medical History:  Diagnosis Date  . Bipolar disorder ([place])   . Depression   . Diabetes mellitus without complication ([place])   . GERD (gastroesophageal reflux disease)   . Hypertension   . Shortness of breath    with exertion  . Transfusion history    with knee replacement    Patient Active Problem List   Diagnosis Date Noted  . Arthritis 04/10/2015  . Bone pain 04/10/2015  . Chest wall muscle strain 04/10/2015  . CN (constipation) 04/10/2015  . CD (contact dermatitis) 04/10/2015  . Narrowing of intervertebral disc space 04/10/2015  . Drug-seeking behavior 04/10/2015  . Difficult or painful urination 04/10/2015  . Brash 04/10/2015  . Hypercholesteremia 04/10/2015  . Flu vaccine need 04/10/2015  . Muscle ache 04/10/2015  . Cervical pain 04/10/2015  . Allergic rhinitis, seasonal 04/10/2015  . Herpes zoster 04/10/2015  . Encounter for screening for respiratory tuberculosis 04/10/2015  . Infection of the upper respiratory tract 04/10/2015  . Nonspecific vaginitis 04/10/2015  . Avitaminosis D 04/10/2015  . Lumbar radiculopathy 02/05/2015  . Hyperlipidemia associated with  type 2 diabetes mellitus ([place]) 06/19/2014  . Chronically dry eyes 06/19/2014  . Bursitis, trochanteric 01/06/2014  . Back pain, chronic 10/17/2013  . Herpes 08/06/2013  . Spondylolisthesis at L4-L5 level 05/03/2013  . OP (osteoporosis) 06/13/2012  . Dermatophytic onychia 05/21/2012  . Diabetes mellitus, type 2 ([place]) 04/04/2011  . Schizo-affective psychosis ([place]) 10/08/2010  . Schizoaffective disorder ([place]) 10/08/2010  . Essential (primary) hypertension 12/19/2001    Past Surgical History:  Procedure Laterality Date  . ANTERIOR CERVICAL DECOMP/DISCECTOMY FUSION N/A 07/26/2012   Procedure: ANTERIOR CERVICAL DECOMPRESSION/DISCECTOMY FUSION 2 LEVELS;  Surgeon: Faythe Ghee, MD;  Location: [facility];  Service: Neurosurgery;  Laterality: N/A;  Cervical Five-Six Cervical Six-Seven  Anterior cervical decompression/diskectomy/fusion/plate (class 4- fly in room)  . BREAST SURGERY     LEFT   PRECANCER CELLS REMOVED   2009    . CHOLECYSTECTOMY    . ECTOPIC PREGNANCY SURGERY     TUBE REMOVED  . JOINT REPLACEMENT Right 2007  . KNEE ARTHROPLASTY     RIGHT  2007     . KNEE ARTHROSCOPY    . THYROID SURGERY     NODULE REMOVED   1989   . WRIST SURGERY     LEFT     2010- fracture    Prior to Admission medications   Medication Sig Start Date End Date Taking? Authorizing Provider  acyclovir (ZOVIRAX) 800 MG tablet Take 1 tablet by mouth daily as needed. 08/08/13   [provider]  amLODipine (NORVASC) 5 MG tablet Take 5 mg by mouth daily.    [provider]  Artificial Tear Ointment (  DRY EYES OP) Place 1 drop into both eyes as needed (dry eyes).    [provider]  aspirin EC 81 MG tablet Take 81 mg by mouth daily.    [provider]  chlorthalidone (HYGROTON) 25 MG tablet Take 25 mg by mouth. 02/05/15 02/05/16  [provider]  diazepam (VALIUM) 2 MG tablet Take 1 tablet (2 mg total) by mouth every 8 (eight) hours as needed for muscle spasms. 06/18/14    Menshew, Dannielle Karvonen, PA-C  diclofenac (VOLTAREN) 75 MG EC tablet  03/04/14   [provider]  docusate sodium (COLACE) 100 MG capsule Take 1 capsule by mouth 2 (two) times daily as needed. 01/06/14   [provider]  DULoxetine (CYMBALTA) 60 MG capsule Take 60 mg by mouth. 02/05/15 02/05/16  [provider]  Estradiol 10 MCG TABS vaginal tablet Place 1 tablet (10 mcg total) vaginally daily. 10/30/14   Rubie Maid, MD  gabapentin (NEURONTIN) 300 MG capsule Take 300 mg by mouth. 02/05/15   [provider]  glucose blood (ONE TOUCH ULTRA TEST) test strip  08/28/13   [provider]  hydrochlorothiazide (HYDRODIURIL) 25 MG tablet Take 1 tablet (25 mg total) by mouth daily. 06/19/14   Luciana Axe, NP  HYDROcodone-acetaminophen (VICODIN) 5-500 MG tablet  11/01/09   [provider]  hydroxypropyl methylcellulose / hypromellose (ISOPTO TEARS / GONIOVISC) 2.5 % ophthalmic solution Place 1 drop into both eyes 3 (three) times daily as needed for dry eyes. 06/19/14   Luciana Axe, NP  hydrOXYzine (ATARAX/VISTARIL) 50 MG tablet Take by mouth. 04/01/14   [provider]  ibuprofen (ADVIL,MOTRIN) 600 MG tablet Take 1 tablet (600 mg total) by mouth every 8 (eight) hours as needed. Patient not taking: Reported on 10/01/2014 07/31/14   Luciana Axe, NP  lisinopril-hydrochlorothiazide (PRINZIDE,ZESTORETIC) 20-12.5 MG tablet  01/27/14   [provider]  lovastatin (MEVACOR) 10 MG tablet Take 10 mg by mouth at bedtime.    [provider]  metFORMIN (GLUCOPHAGE) 1000 MG tablet TAKE 1 TABLET BY MOUTH ONCE DAILY WITH BREAKFAST 06/04/15   Krebs, Amy Lauren, NP  nitrofurantoin, macrocrystal-monohydrate, (MACROBID) 100 MG capsule Take 1 capsule (100 mg total) by mouth 2 (two) times daily. Patient not taking: Reported on 10/01/2014 08/21/14   Luciana Axe, NP  oxyCODONE-acetaminophen (PERCOCET/ROXICET) 5-325 MG per tablet  12/04/08    [provider]  predniSONE (DELTASONE) 50 MG tablet Take one 50 mg tablet once daily for the next five days. 08/04/17   Lannie Fields, PA-C  QUEtiapine (SEROQUEL XR) 50 MG TB24 24 hr tablet  11/24/08   [provider]  quinapril (ACCUPRIL) 20 MG tablet TAKE 1 TABLET BY MOUTH EVERY NIGHT AT BEDTIME (NEEDS APPT FOR FURTHERREFILLS) 10/20/15   Krebs, Genevie Cheshire, NP  raloxifene (EVISTA) 60 MG tablet Take 60 mg by mouth.    [provider]  ranitidine (ZANTAC) 150 MG tablet  03/28/15   [provider]    Allergies Patient has no known allergies.  Family History  Problem Relation Age of Onset  . Hypertension Mother     Social History Social History   Tobacco Use  . Smoking status: Never Smoker  . Smokeless tobacco: Never Used  Substance Use Topics  . Alcohol use: Yes    Alcohol/week: 0.6 oz    Types: 1 Cans of beer per week    Comment: actually 1 every 2 week  . Drug use: No  Review of Systems  Constitutional: No fever/chills Eyes: No visual changes. No discharge ENT: No upper respiratory complaints. Cardiovascular: no chest pain. Respiratory: no cough. No SOB. Gastrointestinal: No abdominal pain.  No nausea, no vomiting.  No diarrhea.  No constipation. Musculoskeletal: Patient has left shoulder pain.  Skin: Negative for rash, abrasions, lacerations, ecchymosis. Neurological: Negative for headaches, focal weakness or numbness.   ____________________________________________   PHYSICAL EXAM:  VITAL SIGNS: ED Triage Vitals  Enc Vitals Group     BP 08/04/17 1420 (!) 131/101     Pulse Rate 08/04/17 1420 (!) 106     Resp 08/04/17 1420 20     Temp 08/04/17 1420 98.8 F (37.1 C)     Temp Source 08/04/17 1420 Oral     SpO2 08/04/17 1420 97 %     Weight 08/04/17 1408 192 lb (87.1 kg)     Height 08/04/17 1408 5\' 8"  (1.727 m)     Head Circumference --      Peak Flow --      Pain Score 08/04/17 1408 9     Pain Loc --      Pain Edu? --       Excl. in [place]? --      Constitutional: Alert and oriented. Well appearing and in no acute distress. Eyes: Conjunctivae are normal. PERRL. EOMI. Head: Atraumatic. Cardiovascular: Normal rate, regular rhythm. Normal S1 and S2.  Good peripheral circulation. Respiratory: Normal respiratory effort without tachypnea or retractions. Lungs CTAB. Good air entry to the bases with no decreased or absent breath sounds. Gastrointestinal: Bowel sounds 4 quadrants. Soft and nontender to palpation. No guarding or rigidity. No palpable masses. No distention. No CVA tenderness. Musculoskeletal: Patient is unable to perform full range of motion at the left shoulder, likely secondary to pain.  She is able to perform abduction to approximately 20 degrees and then has to use her left upper extremity to perform assisted abduction.  No tenderness with palpation over the [facility] joint. Full range of motion at the left elbow and rleft wrist. Palpable radial pulse, left.  Neurologic:  Normal speech and language. No gross focal neurologic deficits are appreciated.  Skin: No erythema of the skin overlying the left shoulder. Psychiatric: Mood and affect are normal. Speech and behavior are normal. Patient exhibits appropriate insight and judgement.   ____________________________________________   LABS (all labs ordered are listed, but only abnormal results are displayed)  Labs Reviewed - No data to display ____________________________________________  EKG   ____________________________________________  RADIOLOGY I personally viewed and evaluated these images as part of my medical decision making, as well as reviewing the written report by the radiologist.    Dg Cervical Spine 2-3 Views  Result Date: 08/04/2017 CLINICAL DATA:  Prior C5 through C7 fusion in 2014, presenting with acute onset of LEFT shoulder pain radiating throughout the LEFT arm that began yesterday while eating dinner. No known injury. EXAM:  CERVICAL SPINE - 2-3 VIEW COMPARISON:  05/20/2015, 06/18/2014. FINDINGS: ACDF with hardware C5 through C7 and interbody fusion with metallic plugs at V4-2 and C6-7, unchanged in appearance since 06/18/2014. Bridging ANTERIOR osteophyte at C4-5 and ossification in the ANTERIOR longitudinal ligament adjacent to C2-3 and C3-4, unchanged. Anatomic alignment. Normal prevertebral soft tissues. No interval change since 2016. IMPRESSION: No acute osseous abnormalities. Prior C5 through C7 fusion. Stable appearance since June, 2016. Electronically Signed   By: Evangeline Dakin M.D.   On: 08/04/2017 16:23   Dg Shoulder Left  Result Date: 08/04/2017  CLINICAL DATA:  Acute onset of LEFT shoulder pain while eating dinner yesterday with limited range of motion. No known injury. EXAM: LEFT SHOULDER - 2+ VIEW COMPARISON:  None. FINDINGS: No evidence of acute fracture. INFERIOR subluxation of the glenohumeral joint and widening of the subacromial space, likely due to a large joint effusion. Hypertrophic spurring involving the humeral head. Acromioclavicular joint intact with mild degenerative changes. Bone mineral density well preserved for age. IMPRESSION: 1. No acute osseous abnormality. 2. INFERIOR subluxation of the glenohumeral joint and widening of the subacromial space is likely due to a large shoulder joint effusion. 3. Severe osteoarthritis involving the glenohumeral joint. Electronically Signed   By: Evangeline Dakin M.D.   On: 08/04/2017 16:18    ____________________________________________    PROCEDURES  Procedure(s) performed:    Procedures    Medications  ketorolac (TORADOL) 30 MG/ML injection 30 mg (30 mg Intramuscular Given 08/04/17 1554)  methocarbamol (ROBAXIN) tablet 1,000 mg (1,000 mg Oral Given 08/04/17 1555)     ____________________________________________   INITIAL IMPRESSION / ASSESSMENT AND PLAN / ED COURSE  Pertinent labs & imaging results that were available during my care of the  patient were reviewed by me and considered in my medical decision making (see chart for details).  Review of the [place] CSRS was performed in accordance of the [place] prior to dispensing any controlled drugs.      Assessment and Plan:  Left shoulder pain:  Patient presents to the emergency department with 10 out of 10 left shoulder pain relieved in the emergency department with Robaxin and Toradol.  X-ray examination of the left shoulder reveals a large joint effusion in the glenohumeral joint associated with arthritic changes.  Osteoarthritis is likely.  Patient was started empirically on prednisone and referred to orthopedics.  Patient education regarding corticosteroid injections were given.  Patient reports that her blood glucose levels have been well controlled in the recent past.  All patient questions were answered.  ____________________________________________  FINAL CLINICAL IMPRESSION(S) / ED DIAGNOSES  Final diagnoses:  Acute pain of left shoulder      NEW MEDICATIONS STARTED DURING THIS VISIT:  ED Discharge Orders        Ordered    predniSONE (DELTASONE) 50 MG tablet     08/04/17 1716          This chart was dictated using voice recognition software/Dragon. Despite best efforts to proofread, errors can occur which can change the meaning. Any change was purely unintentional.    Lannie Fields, PA-C 08/04/17 1735    Carrie Mew, MD 08/08/17 2045

## 2017-08-04 NOTE — ED Notes (Signed)
See triage note  Presents with pain to left shoulder  States pain started yesterday and is moving into left arm   Hx of arthritis and denies any injury  Pt is tearful on arrival

## 2017-08-04 NOTE — ED Triage Notes (Signed)
Pt arrived via POV with reports of left arm and shoulder pain that started yesterday mid-day. Pt states she has had the same pain before related to arthritis. Pt states the pain starts in her shoulder and radiates to fingertips.  Pt states she is taking oxycodone for her back but it is not helping the pain.   Last dose of oxycodone was at 11am this morning.

## 2017-10-13 ENCOUNTER — Other Ambulatory Visit: Payer: Self-pay | Admitting: Pediatrics

## 2017-10-13 ENCOUNTER — Ambulatory Visit
Admission: RE | Admit: 2017-10-13 | Discharge: 2017-10-13 | Disposition: A | Discharge status: Home / Self Care | Payer: Disability Insurance | Source: Ambulatory Visit | Attending: Pediatrics | Admitting: Pediatrics

## 2017-10-13 DIAGNOSIS — M5136 Other intervertebral disc degeneration, lumbar region: Secondary | ICD-10-CM | POA: Diagnosis not present

## 2017-10-13 DIAGNOSIS — M1990 Unspecified osteoarthritis, unspecified site: Secondary | ICD-10-CM | POA: Insufficient documentation

## 2018-04-25 ENCOUNTER — Telehealth: Payer: Self-pay

## 2018-04-25 ENCOUNTER — Encounter: Payer: Self-pay | Admitting: Pain Medicine

## 2018-04-25 NOTE — Telephone Encounter (Signed)
Called patient to get information and history before phone visit tomorrow.

## 2018-04-25 NOTE — Progress Notes (Signed)
Patient's Name: Ashlee Bruce  MRN: [id number]  Referring Provider: Petra Kuba, MD  DOB: 07-24-1955  PCP: Andrey Campanile, MD  DOS: 04/26/2018  Note by: Gaspar Cola, MD  Service setting: Ambulatory outpatient  Specialty: Interventional Pain Management  Location: [facility] Management Virtual Visit  Visit type: Initial Patient Evaluation  Patient type: New Patient   Pain Management Virtual Encounter Note - Virtual Visit via Telephone Telehealth (real-time audio visits between healthcare provider and patient).  Patient's Phone No.:  [phone] (home); [phone] (mobile); (Preferred) [phone] [email or web address]  [id number] - [place], [place] - [address] [place] [address] Phone: [phone] Fax: [phone]  [id number] [place]Phone: [phone]: [phone] [place] [address] [facility]Phone: [phone]: [phone]  Pre-screening note:  Our staff contacted Ashlee Bruce and offered her an "in person", "face-to-face" appointment versus a telephone encounter. She indicated preferring the telephone encounter, at this time.  Primary Reason(s) for Visit: Tele-Encounter for initial evaluation of one or more chronic problems (new to examiner) potentially causing chronic pain, and posing a threat to normal musculoskeletal function. (Level of risk: High) CC: Back Pain (lower) and Hip Pain (right)  I contacted Ashlee Bruce 04/26/2018 at 9:00 AM via telephone and clearly identified myself as FGaspar Cola MD. I verified that I was speaking with the correct person using two identifiers (Name and date of birth: 701/07/1955.  Advanced Informed Consent I sought verbal advanced consent from Ashlee Collardfor  virtual visit interactions. I informed Ashlee Bruce of possible security and privacy concerns, risks, and limitations associated with providing "not-in-person" medical evaluation and management services. I also informed Ashlee Bruce of the availability of "in-person" appointments. Finally, I informed her that there would be a charge for the virtual visit and that she could be  personally, fully or partially, financially responsible for it. Ms. CSaathoffexpressed understanding and agreed to proceed.   HPI  Ms. CHeadrickis a 62y.o. year old, female patient, contacted today for an initial evaluation of her chronic pain. She has Spondylolisthesis at L4-L5 level; Dermatophytic onychia; Schizo-affective psychosis ([place]; Diabetes mellitus, type 2 ([place]; Essential (primary) hypertension; Herpes; OP (osteoporosis); Trochanteric bursitis of hip (Right); Hyperlipidemia associated with type 2 diabetes mellitus ([place]; Chronically dry eyes; Bone pain; Chest wall muscle strain; CN (constipation); CD (contact dermatitis); Narrowing of intervertebral disc space; Drug-seeking behavior; Difficult or painful urination; Brash; Hypercholesteremia; Flu vaccine need; Schizoaffective disorder ([place]; Allergic rhinitis, seasonal; Herpes zoster; Encounter for screening for respiratory tuberculosis; Infection of the upper respiratory tract; Nonspecific vaginitis; Avitaminosis D; Chronic pain syndrome; Chronic musculoskeletal pain; Cervical dysplasia; Cocaine dependence, in remission ([place]; Type II diabetes mellitus ([place]; Essential hypertension; Lumbar radiculopathy, chronic; Chronic hip pain (Primary area of Pain) (Right); Osteoarthritis of hip (Right); Chronic groin pain (Right); Chronic shoulder pain (Secondary Area of Pain) (Bilateral) (R>L); Osteoarthritis of shoulders (Bilateral); Chronic low back pain ([facility]) (Bilateral) (R>L) w/o sciatica; Failed back surgical syndrome; Chronic lower extremity pain (Right); Chronic knee pain s/p  total knee replacement (Fourth Area of Pain) (Right); Chronic knee pain (Fourth Area of Pain) (Right); Chronic neck pain with history of cervical spinal surgery; Chronic neck pain (Fifth Area of Pain) (  Bilateral) (R>L); History of lumbar surgery; History of cervical spinal surgery; History of total knee replacement (Right); Lumbar facet syndrome (Bilateral) (R>L); Chronic sacroiliac joint pain (Bilateral) (R>L); Spasm of muscle of lower back; Disorder of skeletal system; Pharmacologic therapy; Problems influencing health status; and DDD (degenerative disc disease), lumbosacral on their problem list.  Pain Assessment: Location: Right Hip Radiating: down to right upper leg Onset: More than a month ago Duration: Chronic pain Quality: Dull Severity: 10-Worst pain ever/10 (subjective, self-reported pain score)  Effect on ADL: difficulty performing daily activities Timing: Constant Modifying factors: exercise  Onset and Duration: Sudden and Present longer than 3 months Cause of pain: Surgery Severity: Getting worse, NAS-11 at its worse: 10/10, NAS-11 at its best: 8/10, NAS-11 now: 10/10 and NAS-11 on the average: 10/10 Timing: Not influenced by the time of the day, During activity or exercise, After activity or exercise and After a period of immobility Aggravating Factors: Bending, Prolonged sitting and Walking Alleviating Factors: Hot packs and Medications Associated Problems: Pain that wakes patient up and Pain that does not allow patient to sleep Quality of Pain: Dull and Throbbing Previous Examinations or Tests: MRI scan, X-rays, Neurological evaluation, Orthopedic evaluation and Psychiatric evaluation Previous Treatments: Narcotic medications, Physical Therapy, Steroid treatments by mouth, Stretching exercises and TENS  According to the patient the primary area of pain is that of the right hip.  Although she has not had any surgery on that hip, she was scheduled for a total hip replacement, but  this was put on hold due to to the coronavirus.  Her orthopedic surgeon is Dr. Mardelle Matte.  The patient indicates having had physical therapy for the hip but it did not help.  She also indicates having had a total of 4 different injections for the hip to on the lateral aspect of the hip and 2 through the anterior aspect, all of which seem to have helped for approximately 3 weeks, but the pain would always come back.  She had those injections done by Dr. Presley Raddle at a [facility] facility.  The patient indicates having had some recent x-rays of the hip.  The patient's secondary area of pain is that of the shoulders, bilaterally, with the right being worse than the left.  She denies any prior surgeries but she does admit to having had some physical therapy which did not help.  She also indicates having had some injections into the left shoulder by Dr. Jacelyn Grip of [facility].  She also indicates having had some recent x-rays.  The patient also has a history of a prior neck surgery which could be associated to this pain.  The patient's third area of pain is that of the lower back, bilaterally, with the right being worse than the left.  The patient indicates having had a prior back surgery around 2015 by Dr. Hal Neer in [place].  The patient indicates not having had any complications with the surgery.  She indicates that this was a fusion and she was actually pain-free for approximately 6 months after which the pain started to return gradually.  During this time which she was taking some pain medication, but the pain returned despite the fact that she was taking those medicines.  She admits to having had some physical therapy which at the time did help.  She also indicates having had 3 injections in the back done by Dr. Maryjean Ka at the neurosurgery practice where Dr. Hal Neer works.  She admits that they did help for a while.  She denies any recent x-rays or imaging.  In addition to this, the patient indicates having had  some SI joint injections done by Dr. Jacelyn Grip at [facility].  The fourth area pain is described to be the right knee.  In early 2007 she underwent an arthroscopy by Dr. Derald Macleod (prying orthopedics and/or him).  This apparently did not help and subsequently she underwent a total knee replacement on 2007 by the same surgeon.  Although she indicates not having had any steroid injections in the knee, she does describe having had multiple knee aspirations for removing fluid, also done by Dr. Derald Macleod.  She describes having had physical therapy which did help after the surgery but she denies any recent x-rays.  The next area of pain is that of the neck where the pain is in the posterior aspect, bilaterally, with the right being worse than the left.  Around 2013 the patient underwent surgery by Dr. Hal Neer without any complications.  This surgery did help with the pain and she was pain-free for approximately 1 year after which the pain returned gradually.  She denies any nerve blocks, or recent x-rays but she admits to having had some physical therapy after the surgery which did help temporarily.  Currently the patient indicates managing her pain with over-the-counter medications and heat.  She describes that heat works much better than ice.  She also describes that changes in the weather make her pain worse.  She has been taking ibuprofen, which she has a prescription for 600 mg pills and she describes taking approximately 6 of those per day (3600 mg/day).  In addition, the patient describes taking Tylenol, up to 4 tablets/day.  In addition to this, she also admits having received some tramadol 50 mg pills from Dr. Mardelle Matte and she takes 1 every 6 hours (4/day).  The patient was asked about the use of any membrane stabilizes and she admits to having taken some Neurontin 300 mg, but she describes that she was discontinued from the medicine.  She denies any significant side effects to the medicine but does admit  to having had some tachycardia when she took it.  Other than this she also admits having taken Lyrica in the past which did help, but she is currently not taking any.  She was questioning about the use of muscle relaxants and she admits to currently using Flexeril 10 mg p.o. twice daily.  This seems to help with some of the muscle pain and spasms in the lower back.  She describes no side effects to the use of this medicine.  Patient previously seen at the "[facility] Shanon Ace, M.D.).  In addition the patient has also been seen at the [facility].  The patient was informed that my practice is divided into two sections: an interventional pain management section, as well as a completely separate and distinct medication management section. I explained that I have procedure days for my interventional therapies, and evaluation days for follow-ups and medication management. Because of the amount of documentation required during both, they are kept separated. This means that there is the possibility that she may be scheduled for a procedure on one day, and medication management the next. I have also informed her that because of staffing and facility limitations, I no longer take patients for medication management only. To illustrate the reasons for this, I gave the patient the example of surgeons, and how inappropriate it would be to refer a patient to  his/her care, just to write for the post-surgical antibiotics on a surgery done by a different surgeon.   Because interventional pain management is my board-certified specialty, the patient was informed that joining my practice means that they are open to any and all interventional therapies. I made it clear that this does not mean that they will be forced to have any procedures done. What this means is that I believe interventional therapies to be essential part of the diagnosis and proper management of chronic pain conditions.  Therefore, patients not interested in these interventional alternatives will be better served under the care of a different practitioner.  The patient was also made aware of my Comprehensive Pain Management Safety Guidelines where by joining my practice, they limit all of their nerve blocks and joint injections to those done by our practice, for as long as we are retained to manage their care.   Historic Controlled Substance Pharmacotherapy Review  PMP and historical list of controlled substances: Tramadol 50 mg; oxycodone 10 mg; Xtampza ER 9 mg; oxycodone/APAP 10/325; codeine/guaifenesin 10-100 mg/5 mL; oxycodone IR 5 mg; oxycodone/APAP 5/325; diazepam 5 mg; diazepam 2 mg; Opana ER 15 mg; OxyContin ER 15 mg; morphine sulfate ER 15 mg Highest opioid analgesic regimen found: Opana ER 15 mg p.o. twice daily (90 MME) Most recent opioid analgesic: Tramadol 50 mg 1 tablet p.o. 4 times daily (20 MME/day) Current opioid analgesics:  (None)  Highest recorded MME/day: 90 mg/day MME/day: 0 mg/day Medications: Bottles not available for inspection. Pharmacodynamics: Desired effects: Analgesia: The patient reports >50% benefit. Reported improvement in function: The patient reports medication allows her to accomplish basic ADLs. Clinically meaningful improvement in function (CMIF): Sustained CMIF goals met Perceived effectiveness: Described as relatively effective, allowing for increase in activities of daily living (ADL) Undesirable effects: Side-effects or Adverse reactions: None reported Historical Monitoring: The patient  reports no history of drug use. List of all UDS Test(s): No results found. List of other Serum/Urine Drug Screening Test(s):  No results found. Historical Background Evaluation: [place] PMP: PDMP reviewed during this encounter. Six (6) year initial data search conducted. Abnormal pattern detected. Pattern of multiple prescribers found.  A total of 22 prescribers were identified since  2014. PMP NARX Score Report:  Narcotic: 330 Sedative: 140 Stimulant: 000 [place] Department of public safety, offender search: Editor, commissioning Information) Positive for larceny, larceny from person, and position of scheduled II controlled substances. Risk Assessment Profile: Aberrant behavior: None observed or detected today Risk factors for fatal opioid overdose: None identified today PMP NARX Overdose Risk Score: 320 Fatal overdose hazard ratio (HR): Calculation deferred Non-fatal overdose hazard ratio (HR): Calculation deferred Risk of opioid abuse or dependence: 0.7-3.0% with doses ? 36 MME/day and 6.1-26% with doses ? 120 MME/day. Substance use disorder (SUD) risk level: See below Personal History of Substance Abuse (SUD-Substance use disorder):  Alcohol: Negative  Illegal Drugs: Negative  Rx Drugs: Negative  ORT Risk Level calculation: Low Risk Opioid Risk Tool - 04/26/18 0817      Family History of Substance Abuse   Alcohol  Negative    Illegal Drugs  Negative    Rx Drugs  Negative      Personal History of Substance Abuse   Alcohol  Negative    Illegal Drugs  Negative    Rx Drugs  Negative      Age   Age between 56-45 years   No      History of Preadolescent Sexual Abuse   History of Preadolescent  Sexual Abuse  Negative or Female      Psychological Disease   Psychological Disease  Negative    Depression  Negative      Total Score   Opioid Risk Tool Scoring  0    Opioid Risk Interpretation  Low Risk      ORT Scoring interpretation table:  Score <3 = Low Risk for SUD  Score between 4-7 = Moderate Risk for SUD  Score >8 = High Risk for Opioid Abuse   Pharmacologic Plan: As per protocol, I have not taken over any controlled substance management, pending the results of ordered tests and/or consults.            Initial impression: Pending review of available data and ordered tests.  Meds   Current Outpatient Medications:  .  acyclovir (ZOVIRAX) 800 MG tablet, Take 1 tablet  by mouth daily as needed., Disp: , Rfl:  .  docusate sodium (COLACE) 100 MG capsule, Take 1 capsule by mouth 2 (two) times daily as needed., Disp: , Rfl:  .  glucose blood (ONE TOUCH ULTRA TEST) test strip, , Disp: , Rfl:  .  ibuprofen (ADVIL,MOTRIN) 600 MG tablet, Take 1 tablet (600 mg total) by mouth every 8 (eight) hours as needed., Disp: 30 tablet, Rfl: 0 .  lovastatin (MEVACOR) 10 MG tablet, Take 40 mg by mouth at bedtime. , Disp: , Rfl:  .  metFORMIN (GLUCOPHAGE) 1000 MG tablet, TAKE 1 TABLET BY MOUTH ONCE DAILY WITH BREAKFAST (Patient taking differently: 100 mg 2 (two) times daily with a meal. ), Disp: 90 tablet, Rfl: 1 .  QUEtiapine (SEROQUEL XR) 50 MG TB24 24 hr tablet, 50 mg daily. , Disp: , Rfl:  .  ranitidine (ZANTAC) 150 MG tablet, 2 (two) times daily. , Disp: , Rfl:   ROS  Cardiovascular: No reported cardiovascular signs or symptoms such as High blood pressure, coronary artery disease, abnormal heart rate or rhythm, heart attack, blood thinner therapy or heart weakness and/or failure Pulmonary or Respiratory: No reported pulmonary signs or symptoms such as wheezing and difficulty taking a deep full breath (Asthma), difficulty blowing air out (Emphysema), coughing up mucus (Bronchitis), persistent dry cough, or temporary stoppage of breathing during sleep Neurological: No reported neurological signs or symptoms such as seizures, abnormal skin sensations, urinary and/or fecal incontinence, being born with an abnormal open spine and/or a tethered spinal cord Review of Past Neurological Studies: No results found for this or any previous visit. Psychological-Psychiatric: No reported psychological or psychiatric signs or symptoms such as difficulty sleeping, anxiety, depression, delusions or hallucinations (schizophrenial), mood swings (bipolar disorders) or suicidal ideations or attempts Gastrointestinal: No reported gastrointestinal signs or symptoms such as vomiting or evacuating blood,  reflux, heartburn, alternating episodes of diarrhea and constipation, inflamed or scarred liver, or pancreas or irrregular and/or infrequent bowel movements Genitourinary: No reported renal or genitourinary signs or symptoms such as difficulty voiding or producing urine, peeing blood, non-functioning kidney, kidney stones, difficulty emptying the bladder, difficulty controlling the flow of urine, or chronic kidney disease Hematological: No reported hematological signs or symptoms such as prolonged bleeding, low or poor functioning platelets, bruising or bleeding easily, hereditary bleeding problems, low energy levels due to low hemoglobin or being anemic Endocrine: High blood sugar controlled without the use of insulin (NIDDM) Rheumatologic: Joint aches and or swelling due to excess weight (Osteoarthritis) Musculoskeletal: Negative for myasthenia gravis, muscular dystrophy, multiple sclerosis or malignant hyperthermia Work History: Retired  Allergies  Ashlee Bruce has No Known  Allergies.  Laboratory Chemistry  Inflammation Markers (CRP: Acute Phase) (ESR: Chronic Phase) No results found.  Rheumatology Markers No results found.  Renal Function Markers Lab Results  Component Value Date   BUN 20 04/25/2013   CREATININE 0.84 04/25/2013   GFRAA 86 (L) 04/25/2013   GFRNONAA 75 (L) 04/25/2013                             Hepatic Function Markers No results found.  Electrolytes Lab Results  Component Value Date   NA 141 04/25/2013   K 3.8 04/25/2013   CL 103 04/25/2013   CALCIUM 9.4 04/25/2013                        Neuropathy Markers Lab Results  Component Value Date   HGBA1C 6.7 06/19/2014                        CNS Tests No results found.  Bone Pathology Markers No results found.  Coagulation Parameters Lab Results  Component Value Date   PLT 275 04/25/2013                        Cardiovascular Markers Lab Results  Component Value Date   HGB 13.1 04/25/2013   HCT  41.1 04/25/2013                         ID Markers No results found.  CA Markers No results found.  Endocrine Markers No results found.  Note: Lab results reviewed.  Imaging Review  Cervical Imaging: Cervical DG 2-3 views:  Results for orders placed during the hospital encounter of 08/04/17  DG Cervical Spine 2-3 Views   Narrative CLINICAL DATA:  Prior C5 through C7 fusion in 2014, presenting with acute onset of LEFT shoulder pain radiating throughout the LEFT arm that began yesterday while eating dinner. No known injury.  EXAM: CERVICAL SPINE - 2-3 VIEW  COMPARISON:  05/20/2015, 06/18/2014.  FINDINGS: ACDF with hardware C5 through C7 and interbody fusion with metallic plugs at H8-5 and C6-7, unchanged in appearance since 06/18/2014. Bridging ANTERIOR osteophyte at C4-5 and ossification in the ANTERIOR longitudinal ligament adjacent to C2-3 and C3-4, unchanged. Anatomic alignment. Normal prevertebral soft tissues. No interval change since 2016.  IMPRESSION: No acute osseous abnormalities. Prior C5 through C7 fusion. Stable appearance since June, 2016.   Electronically Signed   By: Evangeline Dakin M.D.   On: 08/04/2017 16:23    Shoulder Imaging: Shoulder-L DG:  Results for orders placed during the hospital encounter of 08/04/17  DG Shoulder Left   Narrative CLINICAL DATA:  Acute onset of LEFT shoulder pain while eating dinner yesterday with limited range of motion. No known injury.  EXAM: LEFT SHOULDER - 2+ VIEW  COMPARISON:  None.  FINDINGS: No evidence of acute fracture. INFERIOR subluxation of the glenohumeral joint and widening of the subacromial space, likely due to a large joint effusion. Hypertrophic spurring involving the humeral head. Acromioclavicular joint intact with mild degenerative changes. Bone mineral density well preserved for age.  IMPRESSION: 1. No acute osseous abnormality. 2. INFERIOR subluxation of the glenohumeral joint and  widening of the subacromial space is likely due to a large shoulder joint effusion. 3. Severe osteoarthritis involving the glenohumeral joint.   Electronically Signed   By: Sherran Needs.D.  On: 08/04/2017 16:18    Lumbosacral Imaging: Lumbar MR w/wo contrast:  Results for orders placed during the hospital encounter of 03/20/15  MR Lumbar Spine W Wo Contrast   Addendum ADDENDUM REPORT: 05/31/2015 16:25 ADDENDUM: This is addendum is being made to re-enumerate the lumbar levels so that they conformed with the prior examinations. FINDINGS: Segmentation: There are 6 non rib-bearing lumbar type vertebral bodies. Alignment: The vertebral bodies of the lumbar spine are normal in alignment. Bones: The vertebral bodies of the lumbar spine are normal in size. There is normal bone marrow signal demonstrated throughout the vertebra. The visualized portions of the SI joints are unremarkable. Conus medullaris: Extends to the L1 level and appears normal. The nerve roots of the cauda equina and the filum terminale are normal. Paraspinal and other soft tissues: There is no focal abnormality. The imaged intra-abdominal contents are unremarkable. Disc levels: Disc spaces: Posterior lumbar fusion in at L3-4 with bilateral pedicle screws. L1-2: Mild broad-based disc bulge. No evidence of neural foraminal stenosis. No central canal stenosis. L2-3: Mild broad-based disc bulge and mild left facet arthropathy. Moderate left foraminal stenosis. No evidence of neural foraminal stenosis. No central canal stenosis. L3-4: Mild broad-based disc bulge. No evidence of neural foraminal stenosis. No central canal stenosis. L4-5: Interbody fusion. No evidence of neural foraminal stenosis. No central canal stenosis. L5-6: Mild broad-based disc bulge eccentric towards the left. Mild right foraminal stenosis. No central canal stenosis. Mild bilateral facet arthropathy. L6-S1: Minimal broad-based disc bulge. Moderate right facet  arthropathy. No evidence of neural foraminal stenosis. No central canal stenosis. IMPRESSION: 1. Posterior lumbar interbody fusion at L4-5 without recurrent foraminal or central canal stenosis. 2.  Lumbar spines spondylosis as described above. Electronically Signed   By: Kathreen Devoid   On: 05/31/2015 16:25      Jennette Banker, MD 05/31/2015  4:27 PM         Narrative CLINICAL DATA:  Low back pain radiating to the right hip and down the right leg.  EXAM: MRI LUMBAR SPINE WITHOUT AND WITH CONTRAST  TECHNIQUE: Multiplanar and multiecho pulse sequences of the lumbar spine were obtained without and with intravenous contrast.  CONTRAST:  59m MULTIHANCE GADOBENATE DIMEGLUMINE 529 MG/ML IV SOLN  COMPARISON:  None.  FINDINGS: Segmentation: Conventional anatomy assistant, with the last open disc space designated L5-S1.  Alignment: The vertebral bodies of the lumbar spine are normal in alignment.  Bones: The vertebral bodies of the lumbar spine are normal in size. There is normal bone marrow signal demonstrated throughout the vertebra. The visualized portions of the SI joints are unremarkable.  Conus medullaris: Extends to the T12 level and appears normal. The nerve roots of the cauda equina and the filum terminale are normal.  Paraspinal and other soft tissues: There is no focal abnormality. The imaged intra-abdominal contents are unremarkable.  Disc levels:  Disc spaces: Posterior lumbar fusion in at L3-4 with bilateral pedicle screws.  T12-L1: Mild broad-based disc bulge. No evidence of neural foraminal stenosis. No central canal stenosis.  L1-L2: Mild broad-based disc bulge and mild left facet arthropathy. Moderate left foraminal stenosis. No evidence of neural foraminal stenosis. No central canal stenosis.  L2-L3: Mild broad-based disc bulge. No evidence of neural foraminal stenosis. No central canal stenosis.  L3-L4: Interbody fusion. No evidence of neural foraminal  stenosis. No central canal stenosis.  L4-L5: Mild broad-based disc bulge eccentric towards the left. Mild right foraminal stenosis. No central canal stenosis.  L5-S1: Minimal broad-based disc bulge.  Moderate right facet arthropathy. No evidence of neural foraminal stenosis. No central canal stenosis.  IMPRESSION: 1. Posterior lumbar interbody fusion at L3-4 without recurrent foraminal or central canal stenosis. 2. Lumbar spines spondylosis as described above.  Electronically Signed: By: Kathreen Devoid On: 03/20/2015 12:26    Lumbar CT w contrast:  Results for orders placed during the hospital encounter of 04/10/15  CT Lumbar Spine W Contrast   Narrative CLINICAL DATA:  Low back pain. Recurrent RIGHT leg pain. Excellent relief after the surgery for for almost 2 years  EXAM: LUMBAR MYELOGRAM  FLUOROSCOPY TIME:  15 seconds with dose Area product of 263.08  PROCEDURE: After thorough discussion of risks and benefits of the procedure including bleeding, infection, injury to nerves, blood vessels, adjacent structures as well as headache and CSF leak, written and oral informed consent was obtained. Consent was obtained by Dr. Rolla Flatten. Time out form was completed.  Patient was positioned prone on the fluoroscopy table. Local anesthesia was provided with 1% lidocaine without epinephrine after prepped and draped in the usual sterile fashion. Puncture was performed at L4-5 using a 3 1/2 inch 22-gauge spinal needle via midline approach. Using a single pass through the dura, the needle was placed within the thecal sac, with return of clear CSF. 15 mL of Isovue-M 200 was injected into the thecal sac, with normal opacification of the nerve roots and cauda equina consistent with free flow within the subarachnoid space.  I personally performed the lumbar puncture and administered the intrathecal contrast. I also personally supervised acquisition of the myelogram  images.  TECHNIQUE: Contiguous axial images were obtained through the Lumbar spine after the intrathecal infusion of infusion. Coronal and sagittal reconstructions were obtained of the axial image sets.  COMPARISON:  Multiple priors. MRI lumbar spine 03/20/2015. CT myelogram 04/01/2013. Lumbar MRI 05/23/2012.  FINDINGS: LUMBAR MYELOGRAM FINDINGS:  Good opacification lumbar subarachnoid space.  Transitional anatomy.  Six lumbar vertebrae are present.  Previous L4-5 interbody and posterior fusion. Marked worsening of scoliosis convex RIGHT upper lumbar region, up to 30 degrees on upright standing. Hardware intact and appropriately placed. Interbody cage centered within the interspace. At L5-L6 on the RIGHT, there is a mild extradural defect on prone myelography films, slight effacement of the presumed RIGHT L5 nerve root. Anatomic alignment at this level with patient prone.  With standing, significant narrowing of the subarticular zone at L5-6 on the RIGHT develops, compressing the RIGHT L5 nerve root. In addition, anterolisthesis develops at L5-L6, 3 mm in extension, 3 mm in neutral, and 4 mm in flexion. Moderate ventral defects at L1-2, L2-3 (to the LEFT), and L3-4 are observed contributing to mild stenosis. Stenosis is greatest at the L5-6 level where posterior element hypertrophy is superimposed.  CT LUMBAR MYELOGRAM FINDINGS:  Segmentation: Transitional anatomy. Six lumbar type vertebrae are present.  Alignment:  Normal.  Vertebrae: No worrisome osseous lesion.  Conus medullaris: Normal in size and location. Tip ends at upper L2.  Paraspinal tissues: No evidence for hydronephrosis or paravertebral mass.  Disc levels:  L1-L2:  Central protrusion.  No impingement.  L2-L3: Central and leftward protrusion. Mild facet arthropathy. Disc material extends into the foramen. Asymmetric loss of interspace height on the LEFT. LEFT L1 and LEFT L2 nerve root impingement  are likely.  L3-L4:  Unremarkable.  L4-L5: Trace anterolisthesis. Incomplete arthrodesis across the interspace. Slight subsidence of the cages superiorly and inferiorly. Hardware appropriately placed. Slight loosening of the LEFT L4 screw. No definite residual impingement.  L5-L6: Central protrusion. Facet arthropathy and ligamentum flavum hypertrophy. Mild to moderate stenosis. RIGHT greater than LEFT L5 nerve root impingement. Disc material extends into the BILATERAL neural foramina, worse on the RIGHT, affecting the L4 nerve roots.  L6-S1: Mild bulge. Mild facet arthropathy. No impingement. Compared prior CT myelogram, the impingement at L4-5 is improved. The impingement at L5-6 is worse. The scoliosis convex RIGHT in the upper lumbar region is worse.  IMPRESSION: LUMBAR MYELOGRAM IMPRESSION:  Transitional anatomy.  See discussion above.  Dynamic instability results at L5-6, adjacent segment below the L4-5 fusion, with patient standing, up to 4 mm anterolisthesis in flexion. RIGHT subarticular zone narrowing affects the (presumed) RIGHT L5 nerve root.  Increase in scoliotic deformity, up to 30 degrees, convex RIGHT centered at upper lumbar region.  CT LUMBAR MYELOGRAM IMPRESSION:  Incomplete interbody arthrodesis at L4-5. Slight subsidence. Minimal loosening of LEFT L4 screw. No residual impingement.  Multifactorial stenosis at L5-6 related to central and rightward protrusion and posterior element hypertrophy. RIGHT greater than LEFT L5 and L4 nerve root impingement at this level.  Asymmetric loss of interspace height at L2-3 on the LEFT associated with disc protrusion. LEFT L2 and LEFT L3 nerve root impingement   Electronically Signed   By: Staci Righter M.D.   On: 04/10/2015 13:14    Lumbar DG 2-3 views:  Results for orders placed during the hospital encounter of 10/13/17  DG Lumbar Spine 2-3 Views   Narrative CLINICAL DATA:  Chronic low back  pain  EXAM: LUMBAR SPINE - 2-3 VIEW  COMPARISON:  04/10/2015  FINDINGS: Six lumbar type vertebral bodies are well visualized. Stable fusion at L4-5 is noted. Mild scoliosis concave to the left is again seen and stable. Multilevel osteophytic changes are noted. Disc space narrowing at L5-L6. No soft tissue abnormality is noted.  IMPRESSION: Degenerative and postsurgical changes stable from the previous exam. No acute abnormality noted.   Electronically Signed   By: Inez Catalina M.D.   On: 10/13/2017 15:18    Lumbar DG Myelogram Lumbosacral:  Results for orders placed during the hospital encounter of 04/10/15  DG MYELOGRAPHY LUMBAR INJ LUMBOSACRAL   Narrative CLINICAL DATA:  Low back pain. Recurrent RIGHT leg pain. Excellent relief after the surgery for for almost 2 years  EXAM: LUMBAR MYELOGRAM  FLUOROSCOPY TIME:  15 seconds with dose Area product of 263.08  PROCEDURE: After thorough discussion of risks and benefits of the procedure including bleeding, infection, injury to nerves, blood vessels, adjacent structures as well as headache and CSF leak, written and oral informed consent was obtained. Consent was obtained by Dr. Rolla Flatten. Time out form was completed.  Patient was positioned prone on the fluoroscopy table. Local anesthesia was provided with 1% lidocaine without epinephrine after prepped and draped in the usual sterile fashion. Puncture was performed at L4-5 using a 3 1/2 inch 22-gauge spinal needle via midline approach. Using a single pass through the dura, the needle was placed within the thecal sac, with return of clear CSF. 15 mL of Isovue-M 200 was injected into the thecal sac, with normal opacification of the nerve roots and cauda equina consistent with free flow within the subarachnoid space.  I personally performed the lumbar puncture and administered the intrathecal contrast. I also personally supervised acquisition of the myelogram  images.  TECHNIQUE: Contiguous axial images were obtained through the Lumbar spine after the intrathecal infusion of infusion. Coronal and sagittal reconstructions were obtained of the axial image sets.  COMPARISON:  Multiple priors. MRI lumbar spine 03/20/2015. CT myelogram 04/01/2013. Lumbar MRI 05/23/2012.  FINDINGS: LUMBAR MYELOGRAM FINDINGS:  Good opacification lumbar subarachnoid space.  Transitional anatomy.  Six lumbar vertebrae are present.  Previous L4-5 interbody and posterior fusion. Marked worsening of scoliosis convex RIGHT upper lumbar region, up to 30 degrees on upright standing. Hardware intact and appropriately placed. Interbody cage centered within the interspace. At L5-L6 on the RIGHT, there is a mild extradural defect on prone myelography films, slight effacement of the presumed RIGHT L5 nerve root. Anatomic alignment at this level with patient prone.  With standing, significant narrowing of the subarticular zone at L5-6 on the RIGHT develops, compressing the RIGHT L5 nerve root. In addition, anterolisthesis develops at L5-L6, 3 mm in extension, 3 mm in neutral, and 4 mm in flexion. Moderate ventral defects at L1-2, L2-3 (to the LEFT), and L3-4 are observed contributing to mild stenosis. Stenosis is greatest at the L5-6 level where posterior element hypertrophy is superimposed.  CT LUMBAR MYELOGRAM FINDINGS:  Segmentation: Transitional anatomy. Six lumbar type vertebrae are present.  Alignment:  Normal.  Vertebrae: No worrisome osseous lesion.  Conus medullaris: Normal in size and location. Tip ends at upper L2.  Paraspinal tissues: No evidence for hydronephrosis or paravertebral mass.  Disc levels:  L1-L2:  Central protrusion.  No impingement.  L2-L3: Central and leftward protrusion. Mild facet arthropathy. Disc material extends into the foramen. Asymmetric loss of interspace height on the LEFT. LEFT L1 and LEFT L2 nerve root impingement  are likely.  L3-L4:  Unremarkable.  L4-L5: Trace anterolisthesis. Incomplete arthrodesis across the interspace. Slight subsidence of the cages superiorly and inferiorly. Hardware appropriately placed. Slight loosening of the LEFT L4 screw. No definite residual impingement.  L5-L6: Central protrusion. Facet arthropathy and ligamentum flavum hypertrophy. Mild to moderate stenosis. RIGHT greater than LEFT L5 nerve root impingement. Disc material extends into the BILATERAL neural foramina, worse on the RIGHT, affecting the L4 nerve roots.  L6-S1: Mild bulge. Mild facet arthropathy. No impingement. Compared prior CT myelogram, the impingement at L4-5 is improved. The impingement at L5-6 is worse. The scoliosis convex RIGHT in the upper lumbar region is worse.  IMPRESSION: LUMBAR MYELOGRAM IMPRESSION:  Transitional anatomy.  See discussion above.  Dynamic instability results at L5-6, adjacent segment below the L4-5 fusion, with patient standing, up to 4 mm anterolisthesis in flexion. RIGHT subarticular zone narrowing affects the (presumed) RIGHT L5 nerve root.  Increase in scoliotic deformity, up to 30 degrees, convex RIGHT centered at upper lumbar region.  CT LUMBAR MYELOGRAM IMPRESSION:  Incomplete interbody arthrodesis at L4-5. Slight subsidence. Minimal loosening of LEFT L4 screw. No residual impingement.  Multifactorial stenosis at L5-6 related to central and rightward protrusion and posterior element hypertrophy. RIGHT greater than LEFT L5 and L4 nerve root impingement at this level.  Asymmetric loss of interspace height at L2-3 on the LEFT associated with disc protrusion. LEFT L2 and LEFT L3 nerve root impingement   Electronically Signed   By: Staci Righter M.D.   On: 04/10/2015 13:14    Hip Imaging: Hip-R DG 2-3 views:  Results for orders placed during the hospital encounter of 10/13/17  DG HIP UNILAT WITH PELVIS 2-3 VIEWS RIGHT   Narrative CLINICAL DATA:  Hip  pain  EXAM: DG HIP (WITH OR WITHOUT PELVIS) 3V RIGHT  COMPARISON:  None.  FINDINGS: Degenerative changes of the right hip joint are noted more marked than that on the left. Pelvic ring is intact. No acute fracture or dislocation  is noted.  IMPRESSION: Degenerative changes of the right hip joint without acute abnormality.   Electronically Signed   By: Inez Catalina M.D.   On: 10/13/2017 15:21    Hip-L DG 2-3 views:  Results for orders placed during the hospital encounter of 10/13/17  DG HIP UNILAT WITH PELVIS 2-3 VIEWS LEFT   Narrative CLINICAL DATA:  Chronic hip pain, initial encounter  EXAM: DG HIP (WITH OR WITHOUT PELVIS) 3V LEFT  COMPARISON:  None.  FINDINGS: Pelvic ring is intact. Severe degenerative changes are noted in the right hip joint. Mild degenerative changes on left are seen. No acute fracture or dislocation is noted.  IMPRESSION: Degenerative changes of the hip joints bilaterally right greater than left.   Electronically Signed   By: Inez Catalina M.D.   On: 10/13/2017 15:19    Complexity Note: Imaging results reviewed. Results shared with Ashlee Bruce, using Layman's terms.                         [place]  Drug: Ashlee Bruce  reports no history of drug use. Alcohol:  reports current alcohol use of about 1.0 standard drinks of alcohol per week. Tobacco:  reports that she has never smoked. She has never used smokeless tobacco. Medical:  has a past medical history of Bipolar disorder ([place]), Depression, Diabetes mellitus without complication ([place]), GERD (gastroesophageal reflux disease), Hypertension, Shortness of breath, and Transfusion history. Family: family history includes Hypertension in her mother.  Past Surgical History:  Procedure Laterality Date  . ANTERIOR CERVICAL DECOMP/DISCECTOMY FUSION N/A 07/26/2012   Procedure: ANTERIOR CERVICAL DECOMPRESSION/DISCECTOMY FUSION 2 LEVELS;  Surgeon: Faythe Ghee, MD;  Location: [facility];  Service:  Neurosurgery;  Laterality: N/A;  Cervical Five-Six Cervical Six-Seven  Anterior cervical decompression/diskectomy/fusion/plate (class 4- fly in room)  . BREAST SURGERY     LEFT   PRECANCER CELLS REMOVED   2009    . CHOLECYSTECTOMY    . ECTOPIC PREGNANCY SURGERY     TUBE REMOVED  . JOINT REPLACEMENT Right 2007  . KNEE ARTHROPLASTY     RIGHT  2007     . KNEE ARTHROSCOPY    . THYROID SURGERY     NODULE REMOVED   1989   . WRIST SURGERY     LEFT     2010- fracture   Active Ambulatory Problems    Diagnosis Date Noted  . Spondylolisthesis at L4-L5 level 05/03/2013  . Dermatophytic onychia 05/21/2012  . Schizo-affective psychosis ([place]) 10/08/2010  . Diabetes mellitus, type 2 ([place]) 04/04/2011  . Essential (primary) hypertension 12/19/2001  . Herpes 08/06/2013  . OP (osteoporosis) 06/13/2012  . Trochanteric bursitis of hip (Right) 01/06/2014  . Hyperlipidemia associated with type 2 diabetes mellitus ([place]) 06/19/2014  . Chronically dry eyes 06/19/2014  . Bone pain 04/10/2015  . Chest wall muscle strain 04/10/2015  . CN (constipation) 04/10/2015  . CD (contact dermatitis) 04/10/2015  . Narrowing of intervertebral disc space 04/10/2015  . Drug-seeking behavior 04/10/2015  . Difficult or painful urination 04/10/2015  . Brash 04/10/2015  . Hypercholesteremia 04/10/2015  . Flu vaccine need 04/10/2015  . Schizoaffective disorder ([place]) 10/08/2010  . Allergic rhinitis, seasonal 04/10/2015  . Herpes zoster 04/10/2015  . Encounter for screening for respiratory tuberculosis 04/10/2015  . Infection of the upper respiratory tract 04/10/2015  . Nonspecific vaginitis 04/10/2015  . Avitaminosis D 04/10/2015  . Chronic pain syndrome 04/26/2018  . Chronic musculoskeletal pain 04/26/2018  . Cervical dysplasia  01/13/2016  . Cocaine dependence, in remission ([place]) 08/24/2015  . Type II diabetes mellitus ([place]) 04/04/2011  . Essential hypertension 12/19/2001  . Lumbar radiculopathy, chronic 02/05/2015   . Chronic hip pain (Primary area of Pain) (Right) 04/26/2018  . Osteoarthritis of hip (Right) 04/26/2018  . Chronic groin pain (Right) 04/26/2018  . Chronic shoulder pain (Secondary Area of Pain) (Bilateral) (R>L) 04/26/2018  . Osteoarthritis of shoulders (Bilateral) 04/26/2018  . Chronic low back pain [facility] Area of Pain) (Bilateral) (R>L) w/o sciatica 04/26/2018  . 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 Chronic lower extremity pain (Right) 04/26/2018  . Chronic knee pain s/p total knee replacement (Fourth Area of Pain) (Right) 04/26/2018  . Chronic knee pain (Fourth Area of Pain) (Right) 04/26/2018  . Chronic neck pain with history of cervical spinal surgery 04/26/2018  . Chronic neck pain (Fifth Area of Pain) (Bilateral) (R>L) 04/26/2018  . History of lumbar surgery 04/26/2018  . History of cervical spinal surgery 04/26/2018  . History of total knee replacement (Right) 04/26/2018  . Lumbar facet syndrome (Bilateral) (R>L) 04/26/2018  . Chronic sacroiliac joint pain (Bilateral) (R>L) 04/26/2018  . Spasm of muscle of lower back 04/26/2018  . Disorder of skeletal system 04/26/2018  . Pharmacologic therapy 04/26/2018  . Problems influencing health status 04/26/2018  . DDD (degenerative disc disease), lumbosacral 04/26/2018   Resolved Ambulatory Problems    Diagnosis Date Noted  . No Resolved Ambulatory Problems   Past Medical History:  Diagnosis Date  . Bipolar disorder ([place])   . Depression   . Diabetes mellitus without complication ([place])   . GERD (gastroesophageal reflux disease)   . Hypertension   . Shortness of breath   . Transfusion history    Assessment  Primary Diagnosis & Pertinent Problem List: The primary encounter diagnosis was Chronic pain syndrome. Diagnoses of Chronic hip pain (Primary area of Pain) (Right), Osteoarthritis of hip (Right), Chronic groin pain (Right), Chronic musculoskeletal pain, Chronic shoulder pain (Secondary Area of Pain) (Bilateral)  (R>L), Osteoarthritis of shoulders (Bilateral), Chronic low back pain (Tertiary Area of Pain) (Bilateral) (R>L) w/o sciatica, DDD (degenerative disc disease), lumbosacral, History of lumbar surgery, Failed back surgical syndrome, Lumbar facet syndrome (Bilateral) (R>L), Chronic sacroiliac joint pain (Bilateral) (R>L), Chronic knee pain (Fourth Area of Pain) (Right), History of total knee replacement (Right), Chronic knee pain s/p total knee replacement (Fourth Area of Pain) (Right), Chronic lower extremity pain (Right), Chronic neck pain (Fifth Area of Pain) (Bilateral) (R>L), Chronic neck pain with history of cervical spinal surgery, History of cervical spinal surgery, Spasm of muscle of lower back, Pharmacologic therapy, Cocaine dependence, in remission ([place]), Disorder of skeletal system, Avitaminosis D, and Problems influencing health status were also pertinent to this visit.  Visit Diagnosis (New problems to examiner): 1. Chronic pain syndrome   2. Chronic hip pain (Primary area of Pain) (Right)   3. Osteoarthritis of hip (Right)   4. Chronic groin pain (Right)   5. Chronic musculoskeletal pain   6. Chronic shoulder pain (Secondary Area of Pain) (Bilateral) (R>L)   7. Osteoarthritis of shoulders (Bilateral)   8. Chronic low back pain [facility] Area of Pain) (Bilateral) (R>L) w/o sciatica   9. DDD (degenerative disc disease), lumbosacral   10. History of lumbar surgery   11. Failed back surgical syndrome   12. Lumbar facet syndrome (Bilateral) (R>L)   13. Chronic sacroiliac joint pain (Bilateral) (R>L)   14. Chronic knee pain (Fourth Area of Pain) (Right)   15. History of total  knee replacement (Right)   16. Chronic knee pain s/p total knee replacement (Fourth Area of Pain) (Right)   17. Chronic lower extremity pain (Right)   18. Chronic neck pain (Fifth Area of Pain) (Bilateral) (R>L)   19. Chronic neck pain with history of cervical spinal surgery   20. History of cervical spinal surgery    21. Spasm of muscle of lower back   22. Pharmacologic therapy   23. Cocaine dependence, in remission ([place])   24. Disorder of skeletal system   25. Avitaminosis D   26. Problems influencing health status    Plan of Care (Initial workup plan)  Note: Ashlee Bruce was reminded that as per protocol, today's visit has been an evaluation only. We have not taken over the patient's controlled substance management.  Problem-specific plan: No problem-specific Assessment & Plan notes found for this encounter.   Lab Orders     Compliance Drug Analysis, Ur     Comp. Metabolic Panel (12)     Magnesium     Vitamin B12     Sedimentation rate     25-Hydroxyvitamin D Lcms D2+D3     C-reactive protein  Imaging Orders     DG Shoulder Right     DG Knee 1-2 Views Right Referral Orders  No referral(s) requested today   Procedure Orders    No procedure(s) ordered today   Pharmacotherapy (current): Medications ordered:  No orders of the defined types were placed in this encounter.  Medications administered during this visit: Aarika M. Ralston had no medications administered during this visit.   Pharmacological management options:  Opioid Analgesics: The patient was informed that there is no guarantee that she would be a candidate for opioid analgesics. The decision will be made following CDC guidelines. This decision will be based on the results of diagnostic studies, as well as Ashlee Bruce's risk profile.   Membrane stabilizer: To be determined at a later time  Muscle relaxant: To be determined at a later time  NSAID: To be determined at a later time  Other analgesic(s): To be determined at a later time   Interventional management options: Ashlee Bruce was informed that there is no guarantee that she would be a candidate for interventional therapies. The decision will be based on the results of diagnostic studies, as well as Ashlee Bruce's risk profile.  Procedure(s) under consideration:  The patient  is currently awaiting to have a right total hip replacement by Dr. Mardelle Matte.  This was put on hold due to the coronavirus. Diagnostic bilateral intra-articular shoulder joint injection  Diagnostic bilateral suprascapular nerve blocks  Possible bilateral suprascapular nerve RFA  Diagnostic right-sided cervical epidural steroid injection  Diagnostic bilateral cervical facet block  Possible bilateral cervical facet RFA  Diagnostic bilateral lumbar facet blocks  Possible bilateral lumbar facet RFA  Diagnostic right-sided lumbar epidural steroid injection  Diagnostic right-sided versus bilateral transforaminal epidural steroid injections  Diagnostic bilateral sacroiliac joint block  Possible bilateral sacroiliac joint RFA  Diagnostic right-sided genicular nerve block  Possible right-sided genicular nerve RFA    Provider-requested follow-up: Return in about 3 weeks (around 05/17/2018) for 2nd Visit (Post-tests), w/ NP, (Virtual).  No future appointments.  Total duration of non-face-to-face encounter: 50 minutes.  Primary Care Physician: Andrey Campanile, MD Location: [facility] Note by: Gaspar Cola, MD Date: 04/26/2018; Time: 11:02 AM

## 2018-04-26 ENCOUNTER — Ambulatory Visit: Payer: Medicare Other | Attending: Pain Medicine | Admitting: Pain Medicine

## 2018-04-26 ENCOUNTER — Other Ambulatory Visit: Payer: Self-pay

## 2018-04-26 DIAGNOSIS — M1611 Unilateral primary osteoarthritis, right hip: Secondary | ICD-10-CM | POA: Diagnosis not present

## 2018-04-26 DIAGNOSIS — M25561 Pain in right knee: Secondary | ICD-10-CM

## 2018-04-26 DIAGNOSIS — M47816 Spondylosis without myelopathy or radiculopathy, lumbar region: Secondary | ICD-10-CM | POA: Insufficient documentation

## 2018-04-26 DIAGNOSIS — M533 Sacrococcygeal disorders, not elsewhere classified: Secondary | ICD-10-CM

## 2018-04-26 DIAGNOSIS — M25551 Pain in right hip: Secondary | ICD-10-CM | POA: Diagnosis not present

## 2018-04-26 DIAGNOSIS — M62830 Muscle spasm of back: Secondary | ICD-10-CM

## 2018-04-26 DIAGNOSIS — G894 Chronic pain syndrome: Secondary | ICD-10-CM | POA: Diagnosis not present

## 2018-04-26 DIAGNOSIS — Z96651 Presence of right artificial knee joint: Secondary | ICD-10-CM

## 2018-04-26 DIAGNOSIS — M961 Postlaminectomy syndrome, not elsewhere classified: Secondary | ICD-10-CM

## 2018-04-26 DIAGNOSIS — R1031 Right lower quadrant pain: Secondary | ICD-10-CM

## 2018-04-26 DIAGNOSIS — G8928 Other chronic postprocedural pain: Secondary | ICD-10-CM

## 2018-04-26 DIAGNOSIS — M25511 Pain in right shoulder: Secondary | ICD-10-CM

## 2018-04-26 DIAGNOSIS — Z9889 Other specified postprocedural states: Secondary | ICD-10-CM

## 2018-04-26 DIAGNOSIS — M899 Disorder of bone, unspecified: Secondary | ICD-10-CM

## 2018-04-26 DIAGNOSIS — M51379 Other intervertebral disc degeneration, lumbosacral region without mention of lumbar back pain or lower extremity pain: Secondary | ICD-10-CM | POA: Insufficient documentation

## 2018-04-26 DIAGNOSIS — E559 Vitamin D deficiency, unspecified: Secondary | ICD-10-CM

## 2018-04-26 DIAGNOSIS — M545 Low back pain, unspecified: Secondary | ICD-10-CM

## 2018-04-26 DIAGNOSIS — M25512 Pain in left shoulder: Secondary | ICD-10-CM

## 2018-04-26 DIAGNOSIS — Z79899 Other long term (current) drug therapy: Secondary | ICD-10-CM

## 2018-04-26 DIAGNOSIS — M5137 Other intervertebral disc degeneration, lumbosacral region: Secondary | ICD-10-CM

## 2018-04-26 DIAGNOSIS — M542 Cervicalgia: Secondary | ICD-10-CM

## 2018-04-26 DIAGNOSIS — M19012 Primary osteoarthritis, left shoulder: Secondary | ICD-10-CM

## 2018-04-26 DIAGNOSIS — G8929 Other chronic pain: Secondary | ICD-10-CM

## 2018-04-26 DIAGNOSIS — Z789 Other specified health status: Secondary | ICD-10-CM

## 2018-04-26 DIAGNOSIS — M7918 Myalgia, other site: Secondary | ICD-10-CM

## 2018-04-26 DIAGNOSIS — F1421 Cocaine dependence, in remission: Secondary | ICD-10-CM

## 2018-04-26 DIAGNOSIS — M79604 Pain in right leg: Secondary | ICD-10-CM

## 2018-04-26 DIAGNOSIS — M19011 Primary osteoarthritis, right shoulder: Secondary | ICD-10-CM

## 2018-04-26 NOTE — Patient Instructions (Signed)
____________________________________________________________________________________________  Medication Rules  Purpose: To inform patients, and their family members, of our rules and regulations.  Applies to: All patients receiving prescriptions (written or electronic).  Pharmacy of record: Pharmacy where electronic prescriptions will be sent. If written prescriptions are taken to a different pharmacy, please inform the nursing staff. The pharmacy listed in the electronic medical record should be the one where you would like electronic prescriptions to be sent.  Electronic prescriptions: In compliance with the [place]reng[place] Opio[place] Misuse Prevention (STOP) Act of 2017 (Session Law 2017-74/H243), effective January 03, 2018, all controlled substances must be electronically prescribed. Calling prescriptions to the pharmacy will cease to exist.  Prescription refills: Only during scheduled appointments. Applies to all prescriptions.  NOTE: The following applies primarily to controlled substances (Opioid* Pain Medications).   Patient's responsibilities: 1. Pain Pills: Bring all pain pills to every appointment (except for procedure appointments). 2. Pill Bottles: Bring pills in original pharmacy bottle. Always bring the newest bottle. Bring bottle, even if empty. 3. Medication refills: You are responsible for knowing and keeping track of what medications you take and those you need refilled. The day before your appointment: write a list of all prescriptions that need to be refilled. The day of the appointment: give the list to the admitting nurse. Prescriptions will be written only during appointments. No prescriptions will be written on procedure days. If you forget a medication: it will not be "Called in", "Faxed", or "electronically sent". You will need to get another appointment to get these prescribed. No early refills. Do not call asking to have your prescription filled  early. 4. Prescription Accuracy: You are responsible for carefully inspecting your prescriptions before leaving our office. Have the discharge nurse carefully go over each prescription with you, before taking them home. Make sure that your name is accurately spelled, that your address is correct. Check the name and dose of your medication to make sure it is accurate. Check the number of pills, and the written instructions to make sure they are clear and accurate. Make sure that you are given enough medication to last until your next medication refill appointment. 5. Taking Medication: Take medication as prescribed. When it comes to controlled substances, taking less pills or less frequently than prescribed is permitted and encouraged. Never take more pills than instructed. Never take medication more frequently than prescribed.  6. Inform other Doctors: Always inform, all of your healthcare providers, of all the medications you take. 7. Pain Medication from other Providers: You are not allowed to accept any additional pain medication from any other Doctor or Healthcare provider. There are two exceptions to this rule. (see below) In the event that you require additional pain medication, you are responsible for notifying us, as stated below. 8. Medication Agreement: You are responsible for carefully reading and following our Medication Agreement. This must be signed before receiving any prescriptions from our practice. Safely store a copy of your signed Agreement. Violations to the Agreement will result in no further prescriptions. (Additional copies of our Medication Agreement are available upon request.) 9. Laws, Rules, & Regulations: All patients are expected to follow all Federal and State Laws, Statutes, Rules, & Regulations. Ignorance of the Laws does not constitute a valid excuse. The use of any illegal substances is prohibited. 10. Adopted CDC guidelines & recommendations: Target dosing levels will be  at or below 60 MME/day. Use of benzodiazepines** is not recommended.  Exceptions: There are only two exceptions to the rule of not   receiving pain medications from other Healthcare Providers. 1. Exception #1 (Emergencies): In the event of an emergency (i.e.: accident requiring emergency care), you are allowed to receive additional pain medication. However, you are responsible for: As soon as you are able, call our office [phone], at any time of the day or night, and leave a message stating your name, the date and nature of the emergency, and the name and dose of the medication prescribed. In the event that your call is answered by a member of our staff, make sure to document and save the date, time, and the name of the person that took your information.  2. Exception #2 (Planned Surgery): In the event that you are scheduled by another doctor or dentist to have any type of surgery or procedure, you are allowed (for a period no longer than 30 days), to receive additional pain medication, for the acute post-op pain. However, in this case, you are responsible for picking up a copy of our "Post-op Pain Management for Surgeons" handout, and giving it to your surgeon or dentist. This document is available at our office, and does not require an appointment to obtain it. Simply go to our office during business hours (Monday-Thursday from 8:00 AM to 4:00 PM) (Friday 8:00 AM to 12:00 Noon) or if you have a scheduled appointment with us, prior to your surgery, and ask for it by name. In addition, you will need to provide us with your name, name of your surgeon, type of surgery, and date of procedure or surgery.  *Opioid medications include: morphine, codeine, oxycodone, oxymorphone, hydrocodone, hydromorphone, meperidine, tramadol, tapentadol, buprenorphine, fentanyl, methadone. **Benzodiazepine medications include: diazepam (Valium), alprazolam (Xanax), clonazepam (Klonopine), lorazepam (Ativan), clorazepate  (Tranxene), chlordiazepoxide (Librium), estazolam (Prosom), oxazepam (Serax), temazepam (Restoril), triazolam (Halcion) (Last updated: 03/02/2017) ____________________________________________________________________________________________   ____________________________________________________________________________________________  Medication Recommendations and Reminders  Applies to: All patients receiving prescriptions (written and/or electronic).  Medication Rules & Regulations: These rules and regulations exist for your safety and that of others. They are not flexible and neither are we. Dismissing or ignoring them will be considered "non-compliance" with medication therapy, resulting in complete and irreversible termination of such therapy. (See document titled "Medication Rules" for more details.) In all conscience, because of safety reasons, we cannot continue providing a therapy where the patient does not follow instructions.  Pharmacy of record:   Definition: This is the pharmacy where your electronic prescriptions will be sent.   We do not endorse any particular pharmacy.  You are not restricted in your choice of pharmacy.  The pharmacy listed in the electronic medical record should be the one where you want electronic prescriptions to be sent.  If you choose to change pharmacy, simply notify our nursing staff of your choice of new pharmacy.  Recommendations:  Keep all of your pain medications in a safe place, under lock and key, even if you live alone.   After you fill your prescription, take 1 week's worth of pills and put them away in a safe place. You should keep a separate, properly labeled bottle for this purpose. The remainder should be kept in the original bottle. Use this as your primary supply, until it runs out. Once it's gone, then you know that you have 1 week's worth of medicine, and it is time to come in for a prescription refill. If you do this correctly, it  is unlikely that you will ever run out of medicine.  To make sure that the above recommendation works,   it is very important that you make sure your medication refill appointments are scheduled at least 1 week before you run out of medicine. To do this in an effective manner, make sure that you do not leave the office without scheduling your next medication management appointment. Always ask the nursing staff to show you in your prescription , when your medication will be running out. Then arrange for the receptionist to get you a return appointment, at least 7 days before you run out of medicine. Do not wait until you have 1 or 2 pills left, to come in. This is very poor planning and does not take into consideration that we may need to cancel appointments due to bad weather, sickness, or emergencies affecting our staff.  "Partial Fill": If for any reason your pharmacy does not have enough pills/tablets to completely fill or refill your prescription, do not allow for a "partial fill". You will need a separate prescription to fill the remaining amount, which we will not provide. If the reason for the partial fill is your insurance, you will need to talk to the pharmacist about payment alternatives for the remaining tablets, but again, do not accept a partial fill.  Prescription refills and/or changes in medication(s):   Prescription refills, and/or changes in dose or medication, will be conducted only during scheduled medication management appointments. (Applies to both, written and electronic prescriptions.)  No refills on procedure days. No medication will be changed or started on procedure days. No changes, adjustments, and/or refills will be conducted on a procedure day. Doing so will interfere with the diagnostic portion of the procedure.  No phone refills. No medications will be "called into the pharmacy".  No Fax refills.  No weekend refills.  No Holliday refills.  No after hours  refills.  Remember:  Business hours are:  Monday to Thursday 8:00 AM to 4:00 PM Provider's Schedule: Crystal King, NP - Appointments are:  Medication management: Monday to Thursday 8:00 AM to 4:00 PM Yee Gangi, MD - Appointments are:  Medication management: Monday and Wednesday 8:00 AM to 4:00 PM Procedure day: Tuesday and Thursday 7:30 AM to 4:00 PM Bilal Lateef, MD - Appointments are:  Medication management: Tuesday and Thursday 8:00 AM to 4:00 PM Procedure day: Monday and Wednesday 7:30 AM to 4:00 PM (Last update: 03/02/2017) ____________________________________________________________________________________________   ____________________________________________________________________________________________  CANNABIDIOL (AKA: CBD Oil or Pills)  Applies to: All patients receiving prescriptions of controlled substances (written and/or electronic).  General Information: Cannabidiol (CBD) was discovered in 1940. It is one of some 113 identified cannabinoids in cannabis (Marijuana) plants, accounting for up to 40% of the plant's extract. As of 2018, preliminary clinical research on cannabidiol included studies of anxiety, cognition, movement disorders, and pain.  Cannabidiol is consummed in multiple ways, including inhalation of cannabis smoke or vapor, as an aerosol spray into the cheek, and by mouth. It may be supplied as CBD oil containing CBD as the active ingredient (no added tetrahydrocannabinol (THC) or terpenes), a full-plant CBD-dominant hemp extract oil, capsules, dried cannabis, or as a liquid solution. CBD is thought not have the same psychoactivity as THC, and may affect the actions of THC. Studies suggest that CBD may interact with different biological targets, including cannabinoid receptors and other neurotransmitter receptors. As of 2018 the mechanism of action for its biological effects has not been determined.  In the [place], cannabidiol has a limited  approval by the Food and Drug Administration (FDA) for treatment of only two types   of epilepsy disorders. The side effects of long-term use of the drug include somnolence, decreased appetite, diarrhea, fatigue, malaise, weakness, sleeping problems, and others.  CBD remains a Schedule I drug prohibited for any use.  Legality: Some manufacturers ship CBD products nationally, an illegal action which the FDA has not enforced in 2018, with CBD remaining the subject of an FDA investigational new drug evaluation, and is not considered legal as a dietary supplement or food ingredient as of December 2018. Federal illegality has made it difficult historically to conduct research on CBD. CBD is openly sold in head shops and health food stores in some states where such sales have not been explicitly legalized.  Warning: Because it is not FDA approved for general use or treatment of pain, it is not required to undergo the same manufacturing controls as prescription drugs.  This means that the available cannabidiol (CBD) may be contaminated with THC.  If this is the case, it will trigger a positive urine drug screen (UDS) test for cannabinoids (Marijuana).  Because a positive UDS for illicit substances is a violation of our medication agreement, your opioid analgesics (pain medicine) may be permanently discontinued. (Last update: 03/23/2017) ____________________________________________________________________________________________    ______________________________________________________________________________________________  Specialty Pain Scale  Introduction:  There are significant differences in how pain is reported. The word pain usually refers to physical pain, but it is also a common synonym of suffering. The medical community uses a scale from 0 (zero) to 10 (ten) to report pain level. Zero (0) is described as "no pain", while ten (10) is described as "the worse pain you can imagine". The problem with  this scale is that physical pain is reported along with suffering. Suffering refers to mental pain, or more often yet it refers to any unpleasant feeling, emotion or aversion associated with the perception of harm or threat of harm. It is the psychological component of pain.  Pain Specialists prefer to separate the two components. The pain scale used by this practice is the Verbal Numerical Rating Scale (VNRS-11). This scale is for the physical pain only. DO NOT INCLUDE how your pain psychologically affects you. This scale is for adults 4 years of age and older. It has 11 (eleven) levels. The 1st level is 0/10. This means: "right now, I have no pain". In the context of pain management, it also means: "right now, my physical pain is under control with the current therapy".  General Information:  The scale should reflect your current level of pain. Unless you are specifically asked for the level of your worst pain, or your average pain. If you are asked for one of these two, then it should be understood that it is over the past 24 hours.  Levels 1 (one) through 5 (five) are described below, and can be treated as an outpatient. Ambulatory pain management facilities such as ours are more than adequate to treat these levels. Levels 6 (six) through 10 (ten) are also described below, however, these must be treated as a hospitalized patient. While levels 6 (six) and 7 (seven) may be evaluated at an urgent care facility, levels 8 (eight) through 10 (ten) constitute medical emergencies and as such, they belong in a hospital's emergency department. When having these levels (as described below), do not come to our office. Our facility is not equipped to manage these levels. Go directly to an urgent care facility or an emergency department to be evaluated.  Definitions:  Activities of Daily Living (ADL): Activities of daily living (  ADL or ADLs) is a term used in healthcare to refer to people's daily self-care  activities. Health professionals often use a person's ability or inability to perform ADLs as a measurement of their functional status, particularly in regard to people post injury, with disabilities and the elderly. There are two ADL levels: Basic and Instrumental. Basic Activities of Daily Living (BADL  or BADLs) consist of self-care tasks that include: Bathing and showering; personal hygiene and grooming (including brushing/combing/styling hair); dressing; Toilet hygiene (getting to the toilet, cleaning oneself, and getting back up); eating and self-feeding (not including cooking or chewing and swallowing); functional mobility, often referred to as "transferring", as measured by the ability to walk, get in and out of bed, and get into and out of a chair; the broader definition (moving from one place to another while performing activities) is useful for people with different physical abilities who are still able to get around independently. Basic ADLs include the things many people do when they get up in the morning and get ready to go out of the house: get out of bed, go to the toilet, bathe, dress, groom, and eat. On the average, loss of function typically follows a particular order. Hygiene is the first to go, followed by loss of toilet use and locomotion. The last to go is the ability to eat. When there is only one remaining area in which the person is independent, there is a 62.9% chance that it is eating and only a 3.5% chance that it is hygiene. Instrumental Activities of Daily Living (IADL or IADLs) are not necessary for fundamental functioning, but they let an individual live independently in a community. IADL consist of tasks that include: cleaning and maintaining the house; home establishment and maintenance; care of others (including selecting and supervising caregivers); care of pets; child rearing; managing money; managing financials (investments, etc.); meal preparation and cleanup; shopping for  groceries and necessities; moving within the community; safety procedures and emergency responses; health management and maintenance (taking prescribed medications); and using the telephone or other form of communication.  Instructions:  Most patients tend to report their pain as a combination of two factors, their physical pain and their psychosocial pain. This last one is also known as "suffering" and it is reflection of how physical pain affects you socially and psychologically. From now on, report them separately.  From this point on, when asked to report your pain level, report only your physical pain. Use the following table for reference.  Pain Clinic Pain Levels (0-5/10)  Pain Level Score  Description  No Pain 0   Mild pain 1 Nagging, annoying, but does not interfere with basic activities of daily living (ADL). Patients are able to eat, bathe, get dressed, toileting (being able to get on and off the toilet and perform personal hygiene functions), transfer (move in and out of bed or a chair without assistance), and maintain continence (able to control bladder and bowel functions). Blood pressure and heart rate are unaffected. A normal heart rate for a healthy adult ranges from 60 to 100 bpm (beats per minute).   Mild to moderate pain 2 Noticeable and distracting. Impossible to hide from other people. More frequent flare-ups. Still possible to adapt and function close to normal. It can be very annoying and may have occasional stronger flare-ups. With discipline, patients may get used to it and adapt.   Moderate pain 3 Interferes significantly with activities of daily living (ADL). It becomes difficult to feed, bathe, get  dressed, get on and off the toilet or to perform personal hygiene functions. Difficult to get in and out of bed or a chair without assistance. Very distracting. With effort, it can be ignored when deeply involved in activities.   Moderately severe pain 4 Impossible to ignore for  more than a few minutes. With effort, patients may still be able to manage work or participate in some social activities. Very difficult to concentrate. Signs of autonomic nervous system discharge are evident: dilated pupils (mydriasis); mild sweating (diaphoresis); sleep interference. Heart rate becomes elevated (>115 bpm). Diastolic blood pressure (lower number) rises above 100 mmHg. Patients find relief in laying down and not moving.   Severe pain 5 Intense and extremely unpleasant. Associated with frowning face and frequent crying. Pain overwhelms the senses.  Ability to do any activity or maintain social relationships becomes significantly limited. Conversation becomes difficult. Pacing back and forth is common, as getting into a comfortable position is nearly impossible. Pain wakes you up from deep sleep. Physical signs will be obvious: pupillary dilation; increased sweating; goosebumps; brisk reflexes; cold, clammy hands and feet; nausea, vomiting or dry heaves; loss of appetite; significant sleep disturbance with inability to fall asleep or to remain asleep. When persistent, significant weight loss is observed due to the complete loss of appetite and sleep deprivation.  Blood pressure and heart rate becomes significantly elevated. Caution: If elevated blood pressure triggers a pounding headache associated with blurred vision, then the patient should immediately seek attention at an urgent or emergency care unit, as these may be signs of an impending stroke.    Emergency Department Pain Levels (6-10/10)  Emergency Room Pain 6 Severely limiting. Requires emergency care and should not be seen or managed at an outpatient pain management facility. Communication becomes difficult and requires great effort. Assistance to reach the emergency department may be required. Facial flushing and profuse sweating along with potentially dangerous increases in heart rate and blood pressure will be evident.    Distressing pain 7 Self-care is very difficult. Assistance is required to transport, or use restroom. Assistance to reach the emergency department will be required. Tasks requiring coordination, such as bathing and getting dressed become very difficult.   Disabling pain 8 Self-care is no longer possible. At this level, pain is disabling. The individual is unable to do even the most "basic" activities such as walking, eating, bathing, dressing, transferring to a bed, or toileting. Fine motor skills are lost. It is difficult to think clearly.   Incapacitating pain 9 Pain becomes incapacitating. Thought processing is no longer possible. Difficult to remember your own name. Control of movement and coordination are lost.   The worst pain imaginable 10 At this level, most patients pass out from pain. When this level is reached, collapse of the autonomic nervous system occurs, leading to a sudden drop in blood pressure and heart rate. This in turn results in a temporary and dramatic drop in blood flow to the brain, leading to a loss of consciousness. Fainting is one of the body's self defense mechanisms. Passing out puts the brain in a calmed state and causes it to shut down for a while, in order to begin the healing process.    Summary: 1.   Refer to this scale when providing [place] with your pain level. 2.   Be accurate and careful when reporting your pain level. This will help with your care. 3.   Over-reporting your pain level will lead to loss of credibility. 4.  Even a level of 1/10 means that there is pain and will be treated at our facility. 5.   High, inaccurate reporting will be documented as "Symptom Exaggeration", leading to loss of credibility and suspicions of possible secondary gains such as obtaining more narcotics, or wanting to appear disabled, for fraudulent reasons. 6.   Only pain levels of 5 or below will be seen at our facility. 7.   Pain levels of 6 and above will be sent to the  Emergency Department and the appointment cancelled.  ______________________________________________________________________________________________

## 2018-04-27 ENCOUNTER — Ambulatory Visit
Admission: RE | Admit: 2018-04-27 | Discharge: 2018-04-27 | Disposition: A | Discharge status: Home / Self Care | Payer: Medicare Other | Source: Ambulatory Visit | Attending: Pain Medicine | Admitting: Pain Medicine

## 2018-04-27 ENCOUNTER — Other Ambulatory Visit
Admission: RE | Admit: 2018-04-27 | Discharge: 2018-04-27 | Disposition: A | Discharge status: Home / Self Care | Payer: Medicare Other | Source: Ambulatory Visit | Attending: Pain Medicine | Admitting: Pain Medicine

## 2018-04-27 DIAGNOSIS — Z96651 Presence of right artificial knee joint: Secondary | ICD-10-CM

## 2018-04-27 DIAGNOSIS — M25512 Pain in left shoulder: Secondary | ICD-10-CM | POA: Diagnosis present

## 2018-04-27 DIAGNOSIS — M25561 Pain in right knee: Secondary | ICD-10-CM

## 2018-04-27 DIAGNOSIS — M25511 Pain in right shoulder: Secondary | ICD-10-CM | POA: Insufficient documentation

## 2018-04-27 DIAGNOSIS — G8929 Other chronic pain: Secondary | ICD-10-CM | POA: Insufficient documentation

## 2018-04-27 DIAGNOSIS — M19011 Primary osteoarthritis, right shoulder: Secondary | ICD-10-CM | POA: Diagnosis present

## 2018-04-27 DIAGNOSIS — M19012 Primary osteoarthritis, left shoulder: Secondary | ICD-10-CM | POA: Insufficient documentation

## 2018-04-27 LAB — COMPREHENSIVE METABOLIC PANEL
ALT: 14 U/L (ref 0–44)
AST: 12 U/L — ABNORMAL LOW (ref 15–41)
Albumin: 4 g/dL (ref 3.5–5.0)
Alkaline Phosphatase: 102 U/L (ref 38–126)
Anion gap: 10 (ref 5–15)
BUN: 21 mg/dL (ref 8–23)
CO2: 26 mmol/L (ref 22–32)
Calcium: 8.7 mg/dL — ABNORMAL LOW (ref 8.9–10.3)
Chloride: 102 mmol/L (ref 98–111)
Creatinine, Ser: 0.92 mg/dL (ref 0.44–1.00)
GFR calc Af Amer: >60 mL/min (ref >60)
GFR calc non Af Amer: >60 mL/min (ref >60)
Glucose, Bld: 228 mg/dL — ABNORMAL HIGH (ref 70–99)
Potassium: 3.1 mmol/L — ABNORMAL LOW (ref 3.5–5.1)
Sodium: 138 mmol/L (ref 135–145)
Total Bilirubin: 0.2 mg/dL — ABNORMAL LOW (ref 0.3–1.2)
Total Protein: 7.6 g/dL (ref 6.5–8.1)

## 2018-04-27 LAB — SEDIMENTATION RATE: Sed Rate: 19 mm/hr (ref 0–30)

## 2018-04-27 LAB — VITAMIN B12: Vitamin B-12: 371 pg/mL (ref 180–914)

## 2018-04-27 LAB — C-REACTIVE PROTEIN: CRP: <0.8 mg/dL (ref <1.0)

## 2018-04-27 LAB — MAGNESIUM: Magnesium: 2.1 mg/dL (ref 1.7–2.4)

## 2018-05-02 LAB — 25-HYDROXY VITAMIN D LCMS D2+D3
25-Hydroxy, Vitamin D-2: <1 ng/mL
25-Hydroxy, Vitamin D-3: 36 ng/mL
25-Hydroxy, Vitamin D: 36 ng/mL

## 2018-05-04 ENCOUNTER — Other Ambulatory Visit
Admission: RE | Admit: 2018-05-04 | Discharge: 2018-05-04 | Disposition: A | Discharge status: Home / Self Care | Payer: Medicare Other | Source: Ambulatory Visit | Attending: Pain Medicine | Admitting: Pain Medicine

## 2018-05-04 DIAGNOSIS — Z029 Encounter for administrative examinations, unspecified: Secondary | ICD-10-CM | POA: Insufficient documentation

## 2018-05-04 LAB — URINE DRUG SCREEN, QUALITATIVE (ARMC ONLY)
Amphetamines, Ur Screen: NOT DETECTED
Barbiturates, Ur Screen: NOT DETECTED
Benzodiazepine, Ur Scrn: NOT DETECTED
Cannabinoid 50 Ng, Ur ~~LOC~~: NOT DETECTED
Cocaine Metabolite,Ur ~~LOC~~: NOT DETECTED
MDMA (Ecstasy)Ur Screen: NOT DETECTED
Methadone Scn, Ur: NOT DETECTED
Opiate, Ur Screen: NOT DETECTED
Phencyclidine (PCP) Ur S: NOT DETECTED
Tricyclic, Ur Screen: POSITIVE — AB

## 2018-05-09 ENCOUNTER — Telehealth: Payer: Self-pay

## 2018-05-09 NOTE — Telephone Encounter (Signed)
Called patient and instructed to call for information regarding tomorrows visit.

## 2018-05-09 NOTE — Progress Notes (Signed)
Patient's Name: Ashlee Bruce  MRN: [id number]  Referring Provider: Andrey Campanile, MD  DOB: 04/28/1955  PCP: Petra Kuba, MD  DOS: 05/10/2018  Note by: Gaspar Cola, MD  Service setting: Virtual Visit (Telephone)  Attending: Gaspar Cola, MD  Location: Telephone Encounter  Specialty: Interventional Pain Management  Patient type: Established   Pain Management Virtual Encounter Note - Virtual Visit via Telephone Telehealth (real-time audio visits between healthcare provider and patient).  Patient's Phone No.:  [phone] (home); [phone] (mobile); (Preferred) [phone] [email or web address]  [place], [place] - [address] [facility] [place] [address] Phone: [phone] Fax: [phone]   Pre-screening note:  Our staff contacted Ashlee Bruce and offered her an "in person", "face-to-face" appointment versus a telephone encounter. She indicated preferring the telephone encounter, at this time.   Primary Reason(s) for Virtual Visit: Encounter for evaluation before starting new chronic pain management plan of care (Level of risk: moderate) COVID-19*  Social distancing based on CDC ans AMA recommendations.    I contacted Ashlee Bruce on 05/10/2018 at 2:05 PM via telephone.  I clearly identified myself as Gaspar Cola, MD. I verified that I was speaking with the correct person using two identifiers (Name and date of birth: 1955/02/20).  Advanced Informed Consent I sought verbal advanced consent from Ashlee Bruce for virtual visit interactions. I informed Ashlee Bruce of possible security and privacy concerns, risks, and limitations associated with providing "not-in-person" medical evaluation and management services. I also informed Ashlee Bruce of the availability of "in-person" appointments. Finally, I informed her that there would be a charge for the virtual visit and that she could be  personally,  fully or partially, financially responsible for it. Ashlee Bruce expressed understanding and agreed to proceed.   Historic Elements   Ashlee Bruce is a 63 y.o. year old, female patient evaluated today after her last encounter by our practice on 05/09/2018. Ashlee Bruce  has a past medical history of Bipolar disorder ([place]), Depression, Diabetes mellitus without complication ([place]), GERD (gastroesophageal reflux disease), Hypertension, Shortness of breath, and Transfusion history. She also  has a past surgical history that includes Cholecystectomy; Breast surgery; Knee Arthroplasty; Thyroid surgery; Ectopic pregnancy surgery; Wrist surgery; Anterior cervical decomp/discectomy fusion (N/A, 07/26/2012); Joint replacement (Right, 2007); and Knee arthroscopy. Ashlee Bruce has a current medication list which includes the following prescription(s): acyclovir, cyclobenzaprine, docusate sodium, duloxetine, glucose blood, ibuprofen, jardiance, lovastatin, metformin, quetiapine, ranitidine, and oxycodone. She  reports that she has never smoked. She has never used smokeless tobacco. She reports current alcohol use of about 1.0 standard drinks of alcohol per week. She reports that she does not use drugs. Ashlee Bruce is allergic to raloxifene.   HPI  She is being evaluated for review of studies ordered on initial visit and to consider treatment plan options. Today I went over the results of her tests. These were explained in "Layman's terms". During today's appointment I went over my diagnostic impression, as well as the proposed treatment plan.  The patient indicates currently taking hydrocodone/APAP 5/325 1 tablet p.o. every 12 hours.  Unfortunately this does not seem to be helping her pain.  She has requested that we provide her with some pain stronger.  Today I have reviewed her [place] and in the past she has taken oxycodone 10 mg.  I have informed the patient that due to her  past history  of cocaine use, I will not be prescribing the same amount of pain medicine that she used to take, but I am willing to prescribe oxycodone 5 mg 1 tablet p.o. every 8 hours.  She indicated that this would be enough to control her pain and therefore I have agreed to give her a prescription for this.  I also had her count the amount of hydrocodone pills that she has at home and she indicated that she has #14.  The patient was informed that she needs to bring those back to [place] to dispose of, on her next face-to-face visit.  She agreed.  In reviewing the patient's lab results, we encountered her that she has a potassium of 3.1 and she also has low levels of calcium.  Patient was specifically urged to immediately contact her PCP so that she can be given something to bring up her potassium.  The patient was informed of the risks of cardiac arrhythmias, among other things.  She understood and accepted and indicated that she would be calling her PCP today.  Controlled Substance Pharmacotherapy Assessment REMS (Risk Evaluation and Mitigation Strategy)  Analgesic: Tramadol 50 mg 1 tablet p.o. 4 times daily (20 MME/day).  Since the patient's last virtual visit, she went from taking no pain medication to taking hydrocodone/APAP 5/325 1 tablet p.o. every 12 hours.  Today, I will be discontinuing this medication and I will have her start on oxycodone IR 5 mg p.o. every 8 hours. Highest recorded MME/day: 90 mg/day  MME/day: 10 mg/day.  I will be increasing this today to 22.5 mg/day.  Monitoring: [place] PMP: PDMP reviewed during this encounter.       Not applicable at this point since we have not taken over the patient's medication management yet. List of other Serum/Urine Drug Screening Test(s):  Lab Results  Component Value Date   COCAINSCRNUR NONE DETECTED 05/04/2018   THCU NONE DETECTED 05/04/2018   List of all UDS test(s) done:  No results found. Last UDS on record: No results found. UDS  interpretation: No unexpected findings.          Medication Assessment Form: Patient introduced to form today Treatment compliance: Treatment may start today if patient agrees with proposed plan. Evaluation of compliance is not applicable at this point Risk Assessment Profile: Aberrant behavior: See initial evaluations. None observed or detected today Comorbid factors increasing risk of overdose: See initial evaluation. No additional risks detected today Opioid risk tool (ORT):  Opioid Risk  04/26/2018  Alcohol 0  Illegal Drugs 0  Rx Drugs 0  Alcohol 0  Illegal Drugs 0  Rx Drugs 0  Age between 16-45 years  0  History of Preadolescent Sexual Abuse 0  Psychological Disease 0  Depression 0  Opioid Risk Tool Scoring 0  Opioid Risk Interpretation Low Risk    ORT Scoring interpretation table:  Score <3 = Low Risk for SUD  Score between 4-7 = Moderate Risk for SUD  Score >8 = High Risk for Opioid Abuse   Risk of substance use disorder (SUD): Low  Risk Mitigation Strategies:  Patient opioid safety counseling: Completed today. Counseling provided to patient as per "Patient Counseling Document". Document signed by patient, attesting to counseling and understanding Patient-Prescriber Agreement (PPA): Obtained today.  Controlled substance notification to other providers: Written and sent today.  Pharmacologic Plan: Today we may be taking over the patient's pharmacological regimen. See below.             Meds  Current Outpatient Medications:  .  acyclovir (ZOVIRAX) 800 MG tablet, Take 1 tablet by mouth daily as needed., Disp: , Rfl:  .  cyclobenzaprine (FLEXERIL) 10 MG tablet, Take by mouth., Disp: , Rfl:  .  docusate sodium (COLACE) 100 MG capsule, Take 1 capsule by mouth 2 (two) times daily as needed., Disp: , Rfl:  .  DULoxetine (CYMBALTA) 30 MG capsule, , Disp: , Rfl:  .  glucose blood (ONE TOUCH ULTRA TEST) test strip, , Disp: , Rfl:  .  ibuprofen (ADVIL,MOTRIN) 600 MG tablet,  Take 1 tablet (600 mg total) by mouth every 8 (eight) hours as needed., Disp: 30 tablet, Rfl: 0 .  JARDIANCE 10 MG TABS tablet, , Disp: , Rfl:  .  lovastatin (MEVACOR) 10 MG tablet, Take 40 mg by mouth at bedtime. , Disp: , Rfl:  .  metFORMIN (GLUCOPHAGE) 1000 MG tablet, TAKE 1 TABLET BY MOUTH ONCE DAILY WITH BREAKFAST (Patient taking differently: 100 mg 2 (two) times daily with a meal. ), Disp: 90 tablet, Rfl: 1 .  QUEtiapine (SEROQUEL XR) 50 MG TB24 24 hr tablet, 50 mg daily. , Disp: , Rfl:  .  ranitidine (ZANTAC) 150 MG tablet, 2 (two) times daily. , Disp: , Rfl:  .  oxyCODONE (OXY IR/ROXICODONE) 5 MG immediate release tablet, Take 1 tablet (5 mg total) by mouth every 8 (eight) hours as needed for up to 30 days for severe pain. Must last 30 days., Disp: 90 tablet, Rfl: 0  Laboratory Chemistry  Inflammation Markers (CRP: Acute Phase) (ESR: Chronic Phase) Lab Results  Component Value Date   CRP <0.8 04/27/2018   ESRSEDRATE 19 04/27/2018                         Rheumatology Markers No results found.  Renal Function Markers Lab Results  Component Value Date   BUN 21 04/27/2018   CREATININE 0.92 04/27/2018   GFRAA >60 04/27/2018   GFRNONAA >60 04/27/2018                             Hepatic Function Markers Lab Results  Component Value Date   AST 12 (L) 04/27/2018   ALT 14 04/27/2018   ALBUMIN 4.0 04/27/2018   ALKPHOS 102 04/27/2018                        Electrolytes Lab Results  Component Value Date   NA 138 04/27/2018   K 3.1 (L) 04/27/2018   CL 102 04/27/2018   CALCIUM 8.7 (L) 04/27/2018   MG 2.1 04/27/2018                        Neuropathy Markers Lab Results  Component Value Date   VITAMINB12 371 04/27/2018   HGBA1C 6.7 06/19/2014                        CNS Tests No results found.  Bone Pathology Markers Lab Results  Component Value Date   25OHVITD1 36 04/27/2018   25OHVITD2 <1.0 04/27/2018   25OHVITD3 36 04/27/2018                          Coagulation Parameters Lab Results  Component Value Date   PLT 275 04/25/2013  Cardiovascular Markers Lab Results  Component Value Date   HGB 13.1 04/25/2013   HCT 41.1 04/25/2013                         ID Markers No results found.  CA Markers No results found.  Endocrine Markers No results found.  Note: Lab results reviewed.  Recent Diagnostic Imaging Review  Cervical Imaging: Cervical DG 2-3 views:  Results for orders placed during the hospital encounter of 08/04/17  DG Cervical Spine 2-3 Views   Narrative CLINICAL DATA:  Prior C5 through C7 fusion in 2014, presenting with acute onset of LEFT shoulder pain radiating throughout the LEFT arm that began yesterday while eating dinner. No known injury.  EXAM: CERVICAL SPINE - 2-3 VIEW  COMPARISON:  05/20/2015, 06/18/2014.  FINDINGS: ACDF with hardware C5 through C7 and interbody fusion with metallic plugs at V4-9 and C6-7, unchanged in appearance since 06/18/2014. Bridging ANTERIOR osteophyte at C4-5 and ossification in the ANTERIOR longitudinal ligament adjacent to C2-3 and C3-4, unchanged. Anatomic alignment. Normal prevertebral soft tissues. No interval change since 2016.  IMPRESSION: No acute osseous abnormalities. Prior C5 through C7 fusion. Stable appearance since June, 2016.   Electronically Signed   By: Evangeline Dakin M.D.   On: 08/04/2017 16:23    Shoulder Imaging: Shoulder-R DG:  Results for orders placed during the hospital encounter of 04/27/18  DG Shoulder Right   Narrative CLINICAL DATA:  Right shoulder pain  EXAM: RIGHT SHOULDER - 2+ VIEW  COMPARISON:  None.  FINDINGS: Surgical hardware in the cervical spine. Scarring at the right base. AC joint is intact. No fracture or malalignment. Advanced arthritis of the glenohumeral interval with narrowing, subarticular sclerosis and prominent inferior bony spurring.  IMPRESSION: 1. No acute osseous  abnormality 2. 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   On: 04/27/2018 19:14    Shoulder-L DG:  Results for orders placed during the hospital encounter of 08/04/17  DG Shoulder Left   Narrative CLINICAL DATA:  Acute onset of LEFT shoulder pain while eating dinner yesterday with limited range of motion. No known injury.  EXAM: LEFT SHOULDER - 2+ VIEW  COMPARISON:  None.  FINDINGS: No evidence of acute fracture. INFERIOR subluxation of the glenohumeral joint and widening of the subacromial space, likely due to a large joint effusion. Hypertrophic spurring involving the humeral head. Acromioclavicular joint intact with mild degenerative changes. Bone mineral density well preserved for age.  IMPRESSION: 1. No acute osseous abnormality. 2. INFERIOR subluxation of the glenohumeral joint and widening of the subacromial space is likely due to a large shoulder joint effusion. 3. Severe osteoarthritis involving the glenohumeral joint.   Electronically Signed   By: Evangeline Dakin M.D.   On: 08/04/2017 16:18    Lumbosacral Imaging: Lumbar MR w/wo contrast:  Results for orders placed during the hospital encounter of 03/20/15  MR Lumbar Spine W Wo Contrast   Addendum ADDENDUM REPORT: 05/31/2015 16:25 ADDENDUM: This is addendum is being made to re-enumerate the lumbar levels so that they conformed with the prior examinations. FINDINGS: Segmentation: There are 6 non rib-bearing lumbar type vertebral bodies. Alignment: The vertebral bodies of the lumbar spine are normal in alignment. Bones: The vertebral bodies of the lumbar spine are normal in size. There is normal bone marrow signal demonstrated throughout the vertebra. The visualized portions of the SI joints are unremarkable. Conus medullaris: Extends to the  L1 level and appears normal. The nerve roots of the cauda equina and the filum terminale are normal. Paraspinal and  other soft tissues: There is no focal abnormality. The imaged intra-abdominal contents are unremarkable. Disc levels: Disc spaces: Posterior lumbar fusion in at L3-4 with bilateral pedicle screws. L1-2: Mild broad-based disc bulge. No evidence of neural foraminal stenosis. No central canal stenosis. L2-3: Mild broad-based disc bulge and mild left facet arthropathy. Moderate left foraminal stenosis. No evidence of neural foraminal stenosis. No central canal stenosis. L3-4: Mild broad-based disc bulge. No evidence of neural foraminal stenosis. No central canal stenosis. L4-5: Interbody fusion. No evidence of neural foraminal stenosis. No central canal stenosis. L5-6: Mild broad-based disc bulge eccentric towards the left. Mild right foraminal stenosis. No central canal stenosis. Mild bilateral facet arthropathy. L6-S1: Minimal broad-based disc bulge. Moderate right facet arthropathy. No evidence of neural foraminal stenosis. No central canal stenosis. IMPRESSION: 1. Posterior lumbar interbody fusion at L4-5 without recurrent foraminal or central canal stenosis. 2.  Lumbar spines spondylosis as described above. Electronically Signed   By: Kathreen Devoid   On: 05/31/2015 16:25      Jennette Banker, MD 05/31/2015  4:27 PM         Narrative CLINICAL DATA:  Low back pain radiating to the right hip and down the right leg.  EXAM: MRI LUMBAR SPINE WITHOUT AND WITH CONTRAST  TECHNIQUE: Multiplanar and multiecho pulse sequences of the lumbar spine were obtained without and with intravenous contrast.  CONTRAST:  62m MULTIHANCE GADOBENATE DIMEGLUMINE 529 MG/ML IV SOLN  COMPARISON:  None.  FINDINGS: Segmentation: Conventional anatomy assistant, with the last open disc space designated L5-S1.  Alignment: The vertebral bodies of the lumbar spine are normal in alignment.  Bones: The vertebral bodies of the lumbar spine are normal in size. There is normal bone marrow signal demonstrated throughout the vertebra.  The visualized portions of the SI joints are unremarkable.  Conus medullaris: Extends to the T12 level and appears normal. The nerve roots of the cauda equina and the filum terminale are normal.  Paraspinal and other soft tissues: There is no focal abnormality. The imaged intra-abdominal contents are unremarkable.  Disc levels:  Disc spaces: Posterior lumbar fusion in at L3-4 with bilateral pedicle screws.  T12-L1: Mild broad-based disc bulge. No evidence of neural foraminal stenosis. No central canal stenosis.  L1-L2: Mild broad-based disc bulge and mild left facet arthropathy. Moderate left foraminal stenosis. No evidence of neural foraminal stenosis. No central canal stenosis.  L2-L3: Mild broad-based disc bulge. No evidence of neural foraminal stenosis. No central canal stenosis.  L3-L4: Interbody fusion. No evidence of neural foraminal stenosis. No central canal stenosis.  L4-L5: Mild broad-based disc bulge eccentric towards the left. Mild right foraminal stenosis. No central canal stenosis.  L5-S1: Minimal broad-based disc bulge. Moderate right facet arthropathy. No evidence of neural foraminal stenosis. No central canal stenosis.  IMPRESSION: 1. Posterior lumbar interbody fusion at L3-4 without recurrent foraminal or central canal stenosis. 2. Lumbar spines spondylosis as described above.  Electronically Signed: By: HKathreen DevoidOn: 03/20/2015 12:26    Lumbar CT w contrast:  Results for orders placed during the hospital encounter of 04/10/15  CT Lumbar Spine W Contrast   Narrative CLINICAL DATA:  Low back pain. Recurrent RIGHT leg pain. Excellent relief after the surgery for for almost 2 years  EXAM: LUMBAR MYELOGRAM  FLUOROSCOPY TIME:  15 seconds with dose Area product of 263.08  PROCEDURE: After thorough discussion of  risks and benefits of the procedure including bleeding, infection, injury to nerves, blood vessels, adjacent structures as well as  headache and CSF leak, written and oral informed consent was obtained. Consent was obtained by Dr. Rolla Flatten. Time out form was completed.  Patient was positioned prone on the fluoroscopy table. Local anesthesia was provided with 1% lidocaine without epinephrine after prepped and draped in the usual sterile fashion. Puncture was performed at L4-5 using a 3 1/2 inch 22-gauge spinal needle via midline approach. Using a single pass through the dura, the needle was placed within the thecal sac, with return of clear CSF. 15 mL of Isovue-M 200 was injected into the thecal sac, with normal opacification of the nerve roots and cauda equina consistent with free flow within the subarachnoid space.  I personally performed the lumbar puncture and administered the intrathecal contrast. I also personally supervised acquisition of the myelogram images.  TECHNIQUE: Contiguous axial images were obtained through the Lumbar spine after the intrathecal infusion of infusion. Coronal and sagittal reconstructions were obtained of the axial image sets.  COMPARISON:  Multiple priors. MRI lumbar spine 03/20/2015. CT myelogram 04/01/2013. Lumbar MRI 05/23/2012.  FINDINGS: LUMBAR MYELOGRAM FINDINGS:  Good opacification lumbar subarachnoid space.  Transitional anatomy.  Six lumbar vertebrae are present.  Previous L4-5 interbody and posterior fusion. Marked worsening of scoliosis convex RIGHT upper lumbar region, up to 30 degrees on upright standing. Hardware intact and appropriately placed. Interbody cage centered within the interspace. At L5-L6 on the RIGHT, there is a mild extradural defect on prone myelography films, slight effacement of the presumed RIGHT L5 nerve root. Anatomic alignment at this level with patient prone.  With standing, significant narrowing of the subarticular zone at L5-6 on the RIGHT develops, compressing the RIGHT L5 nerve root. In addition, anterolisthesis develops at  L5-L6, 3 mm in extension, 3 mm in neutral, and 4 mm in flexion. Moderate ventral defects at L1-2, L2-3 (to the LEFT), and L3-4 are observed contributing to mild stenosis. Stenosis is greatest at the L5-6 level where posterior element hypertrophy is superimposed.  CT LUMBAR MYELOGRAM FINDINGS:  Segmentation: Transitional anatomy. Six lumbar type vertebrae are present.  Alignment:  Normal.  Vertebrae: No worrisome osseous lesion.  Conus medullaris: Normal in size and location. Tip ends at upper L2.  Paraspinal tissues: No evidence for hydronephrosis or paravertebral mass.  Disc levels:  L1-L2:  Central protrusion.  No impingement.  L2-L3: Central and leftward protrusion. Mild facet arthropathy. Disc material extends into the foramen. Asymmetric loss of interspace height on the LEFT. LEFT L1 and LEFT L2 nerve root impingement are likely.  L3-L4:  Unremarkable.  L4-L5: Trace anterolisthesis. Incomplete arthrodesis across the interspace. Slight subsidence of the cages superiorly and inferiorly. Hardware appropriately placed. Slight loosening of the LEFT L4 screw. No definite residual impingement.  L5-L6: Central protrusion. Facet arthropathy and ligamentum flavum hypertrophy. Mild to moderate stenosis. RIGHT greater than LEFT L5 nerve root impingement. Disc material extends into the BILATERAL neural foramina, worse on the RIGHT, affecting the L4 nerve roots.  L6-S1: Mild bulge. Mild facet arthropathy. No impingement. Compared prior CT myelogram, the impingement at L4-5 is improved. The impingement at L5-6 is worse. The scoliosis convex RIGHT in the upper lumbar region is worse.  IMPRESSION: LUMBAR MYELOGRAM IMPRESSION:  Transitional anatomy.  See discussion above.  Dynamic instability results at L5-6, adjacent segment below the L4-5 fusion, with patient standing, up to 4 mm anterolisthesis in flexion. RIGHT subarticular zone narrowing affects the (presumed) RIGHT  L5  nerve root.  Increase in scoliotic deformity, up to 30 degrees, convex RIGHT centered at upper lumbar region.  CT LUMBAR MYELOGRAM IMPRESSION:  Incomplete interbody arthrodesis at L4-5. Slight subsidence. Minimal loosening of LEFT L4 screw. No residual impingement.  Multifactorial stenosis at L5-6 related to central and rightward protrusion and posterior element hypertrophy. RIGHT greater than LEFT L5 and L4 nerve root impingement at this level.  Asymmetric loss of interspace height at L2-3 on the LEFT associated with disc protrusion. LEFT L2 and LEFT L3 nerve root impingement   Electronically Signed   By: Staci Righter M.D.   On: 04/10/2015 13:14    Lumbar DG 2-3 views:  Results for orders placed during the hospital encounter of 10/13/17  DG Lumbar Spine 2-3 Views   Narrative CLINICAL DATA:  Chronic low back pain  EXAM: LUMBAR SPINE - 2-3 VIEW  COMPARISON:  04/10/2015  FINDINGS: Six lumbar type vertebral bodies are well visualized. Stable fusion at L4-5 is noted. Mild scoliosis concave to the left is again seen and stable. Multilevel osteophytic changes are noted. Disc space narrowing at L5-L6. No soft tissue abnormality is noted.  IMPRESSION: Degenerative and postsurgical changes stable from the previous exam. No acute abnormality noted.   Electronically Signed   By: Inez Catalina M.D.   On: 10/13/2017 15:18          Lumbar DG Myelogram Lumbosacral:  Results for orders placed during the hospital encounter of 04/10/15  DG MYELOGRAPHY LUMBAR INJ LUMBOSACRAL   Narrative CLINICAL DATA:  Low back pain. Recurrent RIGHT leg pain. Excellent relief after the surgery for for almost 2 years  EXAM: LUMBAR MYELOGRAM  FLUOROSCOPY TIME:  15 seconds with dose Area product of 263.08  PROCEDURE: After thorough discussion of risks and benefits of the procedure including bleeding, infection, injury to nerves, blood vessels, adjacent structures as well as headache and CSF  leak, written and oral informed consent was obtained. Consent was obtained by Dr. Rolla Flatten. Time out form was completed.  Patient was positioned prone on the fluoroscopy table. Local anesthesia was provided with 1% lidocaine without epinephrine after prepped and draped in the usual sterile fashion. Puncture was performed at L4-5 using a 3 1/2 inch 22-gauge spinal needle via midline approach. Using a single pass through the dura, the needle was placed within the thecal sac, with return of clear CSF. 15 mL of Isovue-M 200 was injected into the thecal sac, with normal opacification of the nerve roots and cauda equina consistent with free flow within the subarachnoid space.  I personally performed the lumbar puncture and administered the intrathecal contrast. I also personally supervised acquisition of the myelogram images.  TECHNIQUE: Contiguous axial images were obtained through the Lumbar spine after the intrathecal infusion of infusion. Coronal and sagittal reconstructions were obtained of the axial image sets.  COMPARISON:  Multiple priors. MRI lumbar spine 03/20/2015. CT myelogram 04/01/2013. Lumbar MRI 05/23/2012.  FINDINGS: LUMBAR MYELOGRAM FINDINGS:  Good opacification lumbar subarachnoid space.  Transitional anatomy.  Six lumbar vertebrae are present.  Previous L4-5 interbody and posterior fusion. Marked worsening of scoliosis convex RIGHT upper lumbar region, up to 30 degrees on upright standing. Hardware intact and appropriately placed. Interbody cage centered within the interspace. At L5-L6 on the RIGHT, there is a mild extradural defect on prone myelography films, slight effacement of the presumed RIGHT L5 nerve root. Anatomic alignment at this level with patient prone.  With standing, significant narrowing of the subarticular zone at L5-6 on  the RIGHT develops, compressing the RIGHT L5 nerve root. In addition, anterolisthesis develops at L5-L6, 3 mm in  extension, 3 mm in neutral, and 4 mm in flexion. Moderate ventral defects at L1-2, L2-3 (to the LEFT), and L3-4 are observed contributing to mild stenosis. Stenosis is greatest at the L5-6 level where posterior element hypertrophy is superimposed.  CT LUMBAR MYELOGRAM FINDINGS:  Segmentation: Transitional anatomy. Six lumbar type vertebrae are present.  Alignment:  Normal.  Vertebrae: No worrisome osseous lesion.  Conus medullaris: Normal in size and location. Tip ends at upper L2.  Paraspinal tissues: No evidence for hydronephrosis or paravertebral mass.  Disc levels:  L1-L2:  Central protrusion.  No impingement.  L2-L3: Central and leftward protrusion. Mild facet arthropathy. Disc material extends into the foramen. Asymmetric loss of interspace height on the LEFT. LEFT L1 and LEFT L2 nerve root impingement are likely.  L3-L4:  Unremarkable.  L4-L5: Trace anterolisthesis. Incomplete arthrodesis across the interspace. Slight subsidence of the cages superiorly and inferiorly. Hardware appropriately placed. Slight loosening of the LEFT L4 screw. No definite residual impingement.  L5-L6: Central protrusion. Facet arthropathy and ligamentum flavum hypertrophy. Mild to moderate stenosis. RIGHT greater than LEFT L5 nerve root impingement. Disc material extends into the BILATERAL neural foramina, worse on the RIGHT, affecting the L4 nerve roots.  L6-S1: Mild bulge. Mild facet arthropathy. No impingement. Compared prior CT myelogram, the impingement at L4-5 is improved. The impingement at L5-6 is worse. The scoliosis convex RIGHT in the upper lumbar region is worse.  IMPRESSION: LUMBAR MYELOGRAM IMPRESSION:  Transitional anatomy.  See discussion above.  Dynamic instability results at L5-6, adjacent segment below the L4-5 fusion, with patient standing, up to 4 mm anterolisthesis in flexion. RIGHT subarticular zone narrowing affects the (presumed) RIGHT L5 nerve  root.  Increase in scoliotic deformity, up to 30 degrees, convex RIGHT centered at upper lumbar region.  CT LUMBAR MYELOGRAM IMPRESSION:  Incomplete interbody arthrodesis at L4-5. Slight subsidence. Minimal loosening of LEFT L4 screw. No residual impingement.  Multifactorial stenosis at L5-6 related to central and rightward protrusion and posterior element hypertrophy. RIGHT greater than LEFT L5 and L4 nerve root impingement at this level.  Asymmetric loss of interspace height at L2-3 on the LEFT associated with disc protrusion. LEFT L2 and LEFT L3 nerve root impingement   Electronically Signed   By: Staci Righter M.D.   On: 04/10/2015 13:14    Hip Imaging: Hip-R DG 2-3 views:  Results for orders placed during the hospital encounter of 10/13/17  DG HIP UNILAT WITH PELVIS 2-3 VIEWS RIGHT   Narrative CLINICAL DATA:  Hip pain  EXAM: DG HIP (WITH OR WITHOUT PELVIS) 3V RIGHT  COMPARISON:  None.  FINDINGS: Degenerative changes of the right hip joint are noted more marked than that on the left. Pelvic ring is intact. No acute fracture or dislocation is noted.  IMPRESSION: Degenerative changes of the right hip joint without acute abnormality.   Electronically Signed   By: Inez Catalina M.D.   On: 10/13/2017 15:21    Hip-L DG 2-3 views:  Results for orders placed during the hospital encounter of 10/13/17  DG HIP UNILAT WITH PELVIS 2-3 VIEWS LEFT   Narrative CLINICAL DATA:  Chronic hip pain, initial encounter  EXAM: DG HIP (WITH OR WITHOUT PELVIS) 3V LEFT  COMPARISON:  None.  FINDINGS: Pelvic ring is intact. Severe degenerative changes are noted in the right hip joint. Mild degenerative changes on left are seen. No acute fracture or dislocation  is noted.  IMPRESSION: Degenerative changes of the hip joints bilaterally right greater than left.   Electronically Signed   By: Inez Catalina M.D.   On: 10/13/2017 15:19    Knee Imaging: Knee-R DG 1-2 views:   Results for orders placed during the hospital encounter of 04/27/18  DG Knee 1-2 Views Right   Narrative CLINICAL DATA:  Knee pain  EXAM: RIGHT KNEE - 1-2 VIEW  COMPARISON:  None.  FINDINGS: Status post right knee replacement. No fracture or malalignment. Intact hardware. Trace knee effusion.  IMPRESSION: 1. Status post right knee replacement without acute osseous abnormality. 2. Trace knee effusion   Electronically Signed   By: Donavan Foil M.D.   On: 04/27/2018 19:14    Complexity Note: Imaging results reviewed. Results shared with Ms. Johal, using Layman's terms.                         Assessment  The primary encounter diagnosis was Chronic pain syndrome. 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  Plan of Care  I have discontinued Ashlee Bruce's HYDROcodone-acetaminophen. I am also having her start on oxyCODONE. Additionally, I am having her maintain her acyclovir, docusate sodium, glucose blood, ibuprofen, lovastatin, ranitidine, QUEtiapine, metFORMIN, Jardiance, DULoxetine, and cyclobenzaprine. Pharmacotherapy (Medications Ordered): Meds ordered this encounter  Medications  . oxyCODONE (OXY IR/ROXICODONE) 5 MG immediate release tablet    Sig: Take 1 tablet (5 mg total) by mouth every 8 (eight) hours as needed for up to 30 days for severe pain. Must last 30 days.    Dispense:  90 tablet    Refill:  0    [place] STOP ACT - Not applicable to Chronic Pain Syndrome (G89.4) diagnosis. Fill one day early if pharmacy is closed on scheduled refill date. Do not fill until: 05/10/18. To last until: 06/09/18.    Procedure Orders    No procedure(s) ordered today   Lab Orders  No laboratory test(s) ordered today   Imaging Orders  No imaging studies ordered today   Referral Orders  No referral(s) requested today   Orders:  No orders of the defined types were placed in this encounter.  Pharmacological management options:  Opioid Analgesics: We'll take over management today. See above orders Membrane stabilizer: Options discussed, including a trial. Muscle relaxant: We have discussed the possibility of a trial NSAID: Trial discussed. Other analgesic(s): To be determined at a later time   Interventional management options: Planned, scheduled, and/or pending:    We will probably start with a diagnostic bilateral intra-articular shoulder joint injection.  However, we have to wait until the COVID-19 restrictions are lifted.  Today I will be starting the patient on oxycodone IR 5 mg p.o. every 8 hours and we will discontinue her hydrocodone.   Considering:   The patient is currently awaiting to have a right total hip replacement by Dr. Mardelle Matte.  This was put on hold due to the coronavirus. Diagnostic bilateral intra-articular shoulder joint injection  Diagnostic bilateral suprascapular nerve blocks  Possible bilateral suprascapular nerve RFA  Diagnostic right-sided cervical epidural steroid injection  Diagnostic bilateral cervical facet block  Possible bilateral  cervical facet RFA  Diagnostic bilateral lumbar facet blocks  Possible bilateral lumbar facet RFA  Diagnostic right-sided lumbar epidural steroid injection  Diagnostic right-sided versus bilateral transforaminal epidural steroid injections  Diagnostic bilateral sacroiliac joint block  Possible bilateral sacroiliac joint RFA  Diagnostic right-sided genicular nerve block  Possible right-sided genicular nerve RFA    PRN Procedures:   None at this time   Total duration of non-face-to-face encounter: 38 minutes.  Follow-up plan:    Return in about 1 month (around 06/10/2018) for Med-Mgmt, (Virtual Visit).    No future appointments.  Primary Care Physician: Petra Kuba, MD Location: Telephone Virtual Visit Note by: Gaspar Cola, MD Date: 05/10/2018; Time: 2:49 PM  Disclaimer:  * Given the special circumstances of the COVID-19 pandemic, the federal government has announced that the Office for Civil Rights (OCR) will exercise its enforcement discretion and will not impose penalties on physicians using telehealth in the event of noncompliance with regulatory requirements under the [place] and Accountability Act (HIPAA) in connection with the good faith provision of telehealth during the FOYDX-41 national public health emergency. (AMA)

## 2018-05-10 ENCOUNTER — Encounter: Payer: Self-pay | Admitting: Pain Medicine

## 2018-05-10 ENCOUNTER — Other Ambulatory Visit: Payer: Self-pay

## 2018-05-10 ENCOUNTER — Ambulatory Visit: Payer: Medicare Other | Attending: Nurse Practitioner | Admitting: Pain Medicine

## 2018-05-10 DIAGNOSIS — M19011 Primary osteoarthritis, right shoulder: Secondary | ICD-10-CM

## 2018-05-10 DIAGNOSIS — G894 Chronic pain syndrome: Secondary | ICD-10-CM

## 2018-05-10 DIAGNOSIS — Z96651 Presence of right artificial knee joint: Secondary | ICD-10-CM

## 2018-05-10 DIAGNOSIS — M542 Cervicalgia: Secondary | ICD-10-CM

## 2018-05-10 DIAGNOSIS — G8929 Other chronic pain: Secondary | ICD-10-CM

## 2018-05-10 DIAGNOSIS — M25511 Pain in right shoulder: Secondary | ICD-10-CM | POA: Diagnosis not present

## 2018-05-10 DIAGNOSIS — M25561 Pain in right knee: Secondary | ICD-10-CM

## 2018-05-10 DIAGNOSIS — M545 Low back pain: Secondary | ICD-10-CM

## 2018-05-10 DIAGNOSIS — M25512 Pain in left shoulder: Secondary | ICD-10-CM

## 2018-05-10 DIAGNOSIS — M25551 Pain in right hip: Secondary | ICD-10-CM

## 2018-05-10 DIAGNOSIS — M19012 Primary osteoarthritis, left shoulder: Secondary | ICD-10-CM

## 2018-05-10 MED ORDER — OXYCODONE HCL 5 MG PO TABS: 5.0000 mg | ORAL_TABLET | Freq: Three times a day (TID) | ORAL | 0 refills | Status: DC | PRN

## 2018-05-10 NOTE — Patient Instructions (Signed)
____________________________________________________________________________________________  Medication Rules  Purpose: To inform patients, and their family members, of our rules and regulations.  Applies to: All patients receiving prescriptions (written or electronic).  Pharmacy of record: Pharmacy where electronic prescriptions will be sent. If written prescriptions are taken to a different pharmacy, please inform the nursing staff. The pharmacy listed in the electronic medical record should be the one where you would like electronic prescriptions to be sent.  Electronic prescriptions: In compliance with the [place] Strengthen Opioid Misuse Prevention (STOP) Act of 2017 (Session Law 2017-74/H243), effective January 03, 2018, all controlled substances must be electronically prescribed. Calling prescriptions to the pharmacy will cease to exist.  Prescription refills: Only during scheduled appointments. Applies to all prescriptions.  NOTE: The following applies primarily to controlled substances (Opioid* Pain Medications).   Patient's responsibilities: 1. Pain Pills: Bring all pain pills to every appointment (except for procedure appointments). 2. Pill Bottles: Bring pills in original pharmacy bottle. Always bring the newest bottle. Bring bottle, even if empty. 3. Medication refills: You are responsible for knowing and keeping track of what medications you take and those you need refilled. The day before your appointment: write a list of all prescriptions that need to be refilled. The day of the appointment: give the list to the admitting nurse. Prescriptions will be written only during appointments. No prescriptions will be written on procedure days. If you forget a medication: it will not be "Called in", "Faxed", or "electronically sent". You will need to get another appointment to get these prescribed. No early refills. Do not call asking to have your prescription filled  early. 4. Prescription Accuracy: You are responsible for carefully inspecting your prescriptions before leaving our office. Have the discharge nurse carefully go over each prescription with you, before taking them home. Make sure that your name is accurately spelled, that your address is correct. Check the name and dose of your medication to make sure it is accurate. Check the number of pills, and the written instructions to make sure they are clear and accurate. Make sure that you are given enough medication to last until your next medication refill appointment. 5. Taking Medication: Take medication as prescribed. When it comes to controlled substances, taking less pills or less frequently than prescribed is permitted and encouraged. Never take more pills than instructed. Never take medication more frequently than prescribed.  6. Inform other Doctors: Always inform, all of your healthcare providers, of all the medications you take. 7. Pain Medication from other Providers: You are not allowed to accept any additional pain medication from any other Doctor or Healthcare provider. There are two exceptions to this rule. (see below) In the event that you require additional pain medication, you are responsible for notifying us, as stated below. 8. Medication Agreement: You are responsible for carefully reading and following our Medication Agreement. This must be signed before receiving any prescriptions from our practice. Safely store a copy of your signed Agreement. Violations to the Agreement will result in no further prescriptions. (Additional copies of our Medication Agreement are available upon request.) 9. Laws, Rules, & Regulations: All patients are expected to follow all Federal and State Laws, Statutes, Rules, & Regulations. Ignorance of the Laws does not constitute a valid excuse. The use of any illegal substances is prohibited. 10. Adopted CDC guidelines & recommendations: Target dosing levels will be  at or below 60 MME/day. Use of benzodiazepines** is not recommended.  Exceptions: There are only two exceptions to the rule of not   receiving pain medications from other Healthcare Providers. 1. Exception #1 (Emergencies): In the event of an emergency (i.e.: accident requiring emergency care), you are allowed to receive additional pain medication. However, you are responsible for: As soon as you are able, call our office [phone], at any time of the day or night, and leave a message stating your name, the date and nature of the emergency, and the name and dose of the medication prescribed. In the event that your call is answered by a member of our staff, make sure to document and save the date, time, and the name of the person that took your information.  2. Exception #2 (Planned Surgery): In the event that you are scheduled by another doctor or dentist to have any type of surgery or procedure, you are allowed (for a period no longer than 30 days), to receive additional pain medication, for the acute post-op pain. However, in this case, you are responsible for picking up a copy of our "Post-op Pain Management for Surgeons" handout, and giving it to your surgeon or dentist. This document is available at our office, and does not require an appointment to obtain it. Simply go to our office during business hours (Monday-Thursday from 8:00 AM to 4:00 PM) (Friday 8:00 AM to 12:00 Noon) or if you have a scheduled appointment with us, prior to your surgery, and ask for it by name. In addition, you will need to provide us with your name, name of your surgeon, type of surgery, and date of procedure or surgery.  *Opioid medications include: morphine, codeine, oxycodone, oxymorphone, hydrocodone, hydromorphone, meperidine, tramadol, tapentadol, buprenorphine, fentanyl, methadone. **Benzodiazepine medications include: diazepam (Valium), alprazolam (Xanax), clonazepam (Klonopine), lorazepam (Ativan), clorazepate  (Tranxene), chlordiazepoxide (Librium), estazolam (Prosom), oxazepam (Serax), temazepam (Restoril), triazolam (Halcion) (Last updated: 03/02/2017) ____________________________________________________________________________________________   ____________________________________________________________________________________________  Medication Recommendations and Reminders  Applies to: All patients receiving prescriptions (written and/or electronic).  Medication Rules & Regulations: These rules and regulations exist for your safety and that of others. They are not flexible and neither are we. Dismissing or ignoring them will be considered "non-compliance" with medication therapy, resulting in complete and irreversible termination of such therapy. (See document titled "Medication Rules" for more details.) In all conscience, because of safety reasons, we cannot continue providing a therapy where the patient does not follow instructions.  Pharmacy of record:   Definition: This is the pharmacy where your electronic prescriptions will be sent.   We do not endorse any particular pharmacy.  You are not restricted in your choice of pharmacy.  The pharmacy listed in the electronic medical record should be the one where you want electronic prescriptions to be sent.  If you choose to change pharmacy, simply notify our nursing staff of your choice of new pharmacy.  Recommendations:  Keep all of your pain medications in a safe place, under lock and key, even if you live alone.   After you fill your prescription, take 1 week's worth of pills and put them away in a safe place. You should keep a separate, properly labeled bottle for this purpose. The remainder should be kept in the original bottle. Use this as your primary supply, until it runs out. Once it's gone, then you know that you have 1 week's worth of medicine, and it is time to come in for a prescription refill. If you do this correctly, it  is unlikely that you will ever run out of medicine.  To make sure that the above recommendation works,   it is very important that you make sure your medication refill appointments are scheduled at least 1 week before you run out of medicine. To do this in an effective manner, make sure that you do not leave the office without scheduling your next medication management appointment. Always ask the nursing staff to show you in your prescription , when your medication will be running out. Then arrange for the receptionist to get you a return appointment, at least 7 days before you run out of medicine. Do not wait until you have 1 or 2 pills left, to come in. This is very poor planning and does not take into consideration that we may need to cancel appointments due to bad weather, sickness, or emergencies affecting our staff.  "Partial Fill": If for any reason your pharmacy does not have enough pills/tablets to completely fill or refill your prescription, do not allow for a "partial fill". You will need a separate prescription to fill the remaining amount, which we will not provide. If the reason for the partial fill is your insurance, you will need to talk to the pharmacist about payment alternatives for the remaining tablets, but again, do not accept a partial fill.  Prescription refills and/or changes in medication(s):   Prescription refills, and/or changes in dose or medication, will be conducted only during scheduled medication management appointments. (Applies to both, written and electronic prescriptions.)  No refills on procedure days. No medication will be changed or started on procedure days. No changes, adjustments, and/or refills will be conducted on a procedure day. Doing so will interfere with the diagnostic portion of the procedure.  No phone refills. No medications will be "called into the pharmacy".  No Fax refills.  No weekend refills.  No Holliday refills.  No after hours  refills.  Remember:  Business hours are:  Monday to Thursday 8:00 AM to 4:00 PM Provider's Schedule: Crystal King, NP - Appointments are:  Medication management: Monday to Thursday 8:00 AM to 4:00 PM Jj Enyeart, MD - Appointments are:  Medication management: Monday and Wednesday 8:00 AM to 4:00 PM Procedure day: Tuesday and Thursday 7:30 AM to 4:00 PM Bilal Lateef, MD - Appointments are:  Medication management: Tuesday and Thursday 8:00 AM to 4:00 PM Procedure day: Monday and Wednesday 7:30 AM to 4:00 PM (Last update: 03/02/2017) ____________________________________________________________________________________________   ____________________________________________________________________________________________  CANNABIDIOL (AKA: CBD Oil or Pills)  Applies to: All patients receiving prescriptions of controlled substances (written and/or electronic).  General Information: Cannabidiol (CBD) was discovered in 1940. It is one of some 113 identified cannabinoids in cannabis (Marijuana) plants, accounting for up to 40% of the plant's extract. As of 2018, preliminary clinical research on cannabidiol included studies of anxiety, cognition, movement disorders, and pain.  Cannabidiol is consummed in multiple ways, including inhalation of cannabis smoke or vapor, as an aerosol spray into the cheek, and by mouth. It may be supplied as CBD oil containing CBD as the active ingredient (no added tetrahydrocannabinol (THC) or terpenes), a full-plant CBD-dominant hemp extract oil, capsules, dried cannabis, or as a liquid solution. CBD is thought not have the same psychoactivity as THC, and may affect the actions of THC. Studies suggest that CBD may interact with different biological targets, including cannabinoid receptors and other neurotransmitter receptors. As of 2018 the mechanism of action for its biological effects has not been determined.  In the [place], cannabidiol has a limited  approval by the Food and Drug Administration (FDA) for treatment of only two types   of epilepsy disorders. The side effects of long-term use of the drug include somnolence, decreased appetite, diarrhea, fatigue, malaise, weakness, sleeping problems, and others.  CBD remains a Schedule I drug prohibited for any use.  Legality: Some manufacturers ship CBD products nationally, an illegal action which the FDA has not enforced in 2018, with CBD remaining the subject of an FDA investigational new drug evaluation, and is not considered legal as a dietary supplement or food ingredient as of December 2018. Federal illegality has made it difficult historically to conduct research on CBD. CBD is openly sold in head shops and health food stores in some states where such sales have not been explicitly legalized.  Warning: Because it is not FDA approved for general use or treatment of pain, it is not required to undergo the same manufacturing controls as prescription drugs.  This means that the available cannabidiol (CBD) may be contaminated with THC.  If this is the case, it will trigger a positive urine drug screen (UDS) test for cannabinoids (Marijuana).  Because a positive UDS for illicit substances is a violation of our medication agreement, your opioid analgesics (pain medicine) may be permanently discontinued. (Last update: 03/23/2017) ____________________________________________________________________________________________    

## 2018-05-31 ENCOUNTER — Encounter: Payer: Self-pay | Admitting: Pain Medicine

## 2018-06-01 ENCOUNTER — Other Ambulatory Visit: Payer: Self-pay | Admitting: Student

## 2018-06-01 ENCOUNTER — Ambulatory Visit
Admission: RE | Admit: 2018-06-01 | Discharge: 2018-06-01 | Disposition: A | Discharge status: Home / Self Care | Payer: Medicare Other | Source: Ambulatory Visit | Attending: Student | Admitting: Student

## 2018-06-01 ENCOUNTER — Ambulatory Visit
Admission: RE | Admit: 2018-06-01 | Discharge: 2018-06-01 | Disposition: A | Discharge status: Home / Self Care | Payer: Medicare Other | Attending: Student | Admitting: Student

## 2018-06-01 ENCOUNTER — Other Ambulatory Visit: Payer: Self-pay

## 2018-06-01 DIAGNOSIS — M25512 Pain in left shoulder: Secondary | ICD-10-CM | POA: Insufficient documentation

## 2018-06-04 ENCOUNTER — Other Ambulatory Visit: Payer: Self-pay

## 2018-06-04 ENCOUNTER — Ambulatory Visit: Payer: Medicare Other | Attending: Pain Medicine | Admitting: Pain Medicine

## 2018-06-04 DIAGNOSIS — Z01818 Encounter for other preprocedural examination: Secondary | ICD-10-CM | POA: Insufficient documentation

## 2018-06-04 DIAGNOSIS — M25511 Pain in right shoulder: Secondary | ICD-10-CM

## 2018-06-04 DIAGNOSIS — M25551 Pain in right hip: Secondary | ICD-10-CM | POA: Diagnosis not present

## 2018-06-04 DIAGNOSIS — G894 Chronic pain syndrome: Secondary | ICD-10-CM

## 2018-06-04 DIAGNOSIS — Z87898 Personal history of other specified conditions: Secondary | ICD-10-CM | POA: Insufficient documentation

## 2018-06-04 DIAGNOSIS — M25512 Pain in left shoulder: Secondary | ICD-10-CM

## 2018-06-04 DIAGNOSIS — G8929 Other chronic pain: Secondary | ICD-10-CM

## 2018-06-04 MED ORDER — OXYCODONE HCL 5 MG PO TABS: 5.0000 mg | ORAL_TABLET | Freq: Three times a day (TID) | ORAL | 0 refills | Status: DC | PRN

## 2018-06-04 NOTE — Patient Instructions (Signed)
____________________________________________________________________________________________  Preparing for your procedure (without sedation)  Procedure appointments are limited to planned procedures: . No Prescription Refills. . No disability issues will be discussed. . No medication changes will be discussed.  Instructions: . Oral Intake: Do not eat or drink anything for at least 3 hours prior to your procedure. . Transportation: Unless otherwise stated by your physician, you may drive yourself after the procedure. . Blood Pressure Medicine: Take your blood pressure medicine with a sip of water the morning of the procedure. . Blood thinners: Notify our staff if you are taking any blood thinners. Depending on which one you take, there will be specific instructions on how and when to stop it. . Diabetics on insulin: Notify the staff so that you can be scheduled 1st case in the morning. If your diabetes requires high dose insulin, take only  of your normal insulin dose the morning of the procedure and notify the staff that you have done so. . Preventing infections: Shower with an antibacterial soap the morning of your procedure.  . Build-up your immune system: Take 1000 mg of Vitamin C with every meal (3 times a day) the day prior to your procedure. . Antibiotics: Inform the staff if you have a condition or reason that requires you to take antibiotics before dental procedures. . Pregnancy: If you are pregnant, call and cancel the procedure. . Sickness: If you have a cold, fever, or any active infections, call and cancel the procedure. . Arrival: You must be in the facility at least 30 minutes prior to your scheduled procedure. . Children: Do not bring any children with you. . Dress appropriately: Bring dark clothing that you would not mind if they get stained. . Valuables: Do not bring any jewelry or valuables.  Reasons to call and reschedule or cancel your procedure: (Following these  recommendations will minimize the risk of a serious complication.) . Surgeries: Avoid having procedures within 2 weeks of any surgery. (Avoid for 2 weeks before or after any surgery). . Flu Shots: Avoid having procedures within 2 weeks of a flu shots or . (Avoid for 2 weeks before or after immunizations). . Barium: Avoid having a procedure within 7-10 days after having had a radiological study involving the use of radiological contrast. (Myelograms, Barium swallow or enema study). . Heart attacks: Avoid any elective procedures or surgeries for the initial 6 months after a "Myocardial Infarction" (Heart Attack). . Blood thinners: It is imperative that you stop these medications before procedures. Let us know if you if you take any blood thinner.  . Infection: Avoid procedures during or within two weeks of an infection (including chest colds or gastrointestinal problems). Symptoms associated with infections include: Localized redness, fever, chills, night sweats or profuse sweating, burning sensation when voiding, cough, congestion, stuffiness, runny nose, sore throat, diarrhea, nausea, vomiting, cold or Flu symptoms, recent or current infections. It is specially important if the infection is over the area that we intend to treat. . Heart and lung problems: Symptoms that may suggest an active cardiopulmonary problem include: cough, chest pain, breathing difficulties or shortness of breath, dizziness, ankle swelling, uncontrolled high or unusually low blood pressure, and/or palpitations. If you are experiencing any of these symptoms, cancel your procedure and contact your primary care physician for an evaluation.  Remember:  Regular Business hours are:  Monday to Thursday 8:00 AM to 4:00 PM  Provider's Schedule: Jenefer Woerner, MD:  Procedure days: Tuesday and Thursday 7:30 AM to 4:00 PM  Bilal   Lateef, MD:  Procedure days: Monday and Wednesday 7:30 AM to 4:00  PM ____________________________________________________________________________________________    

## 2018-06-04 NOTE — Progress Notes (Signed)
Pain Management Virtual Encounter Note - Virtual Visit via Telephone Telehealth (real-time audio visits between healthcare provider and patient).  Patient's Phone No. & Preferred Pharmacy:  [phone] (home); [phone] (mobile); (Preferred) [phone] [email or web address]  [place], [place] - [address] [place] [address] [facility] [place] [address] Phone: [phone] Fax: [phone]   Pre-screening note:  Our staff contacted Ashlee Bruce and offered her an "in person", "face-to-face" appointment versus a telephone encounter. She indicated preferring the telephone encounter, at this time.  Reason for Virtual Visit: COVID-19*  Social distancing based on CDC and AMA recommendations.   I contacted Ashlee Bruce on 06/04/2018 at 12:38 PM via telephone.      I clearly identified myself as Gaspar Cola, MD. I verified that I was speaking with the correct person using two identifiers (Name and date of birth: 1955/10/26).  Advanced Informed Consent I sought verbal advanced consent from Ashlee Bruce for virtual visit interactions. I informed Ashlee Bruce of possible security and privacy concerns, risks, and limitations associated with providing "not-in-person" medical evaluation and management services. I also informed Ashlee Bruce of the availability of "in-person" appointments. Finally, I informed her that there would be a charge for the virtual visit and that she could be  personally, fully or partially, financially responsible for it. Ashlee Bruce expressed understanding and agreed to proceed.   Historic Elements   [name] is a 63 y.o. year old, female patient evaluated today after her last encounter by our practice on 05/10/2018. Ashlee Bruce  has a past medical history of Bipolar disorder ([place]), Depression, Diabetes mellitus without complication ([place]), GERD (gastroesophageal reflux disease), Hypertension, Shortness of breath, and  Transfusion history. She also  has a past surgical history that includes Cholecystectomy; Breast surgery; Knee Arthroplasty; Thyroid surgery; Ectopic pregnancy surgery; Wrist surgery; Anterior cervical decomp/discectomy fusion (N/A, 07/26/2012); Joint replacement (Right, 2007); and Knee arthroscopy. Ashlee Bruce has a current medication list which includes the following prescription(s): cyclobenzaprine, docusate sodium, duloxetine, glucose blood, ibuprofen, jardiance, lovastatin, metformin, oxycodone, and quetiapine. She  reports that she has never smoked. She has never used smokeless tobacco. She reports current alcohol use of about 1.0 standard drinks of alcohol per week. She reports that she does not use drugs. Ashlee Bruce is allergic to raloxifene.   HPI  I last communicated with her on 05/10/2018. Today, she is being contacted for medication management.  Pharmacotherapy Assessment  Analgesic: Oxycodone IR 5 mg every 8 hours (15 mg/day of oxycodone) MME/day: 22.5 mg/day.   Monitoring: Pharmacotherapy: No side-effects or adverse reactions reported.  PMP: PDMP reviewed during this encounter.       Compliance: No problems identified. Effectiveness: Clinically acceptable. Plan: Refer to "POC".  Pertinent Labs  Renal Function Lab Results  Component Value Date   BUN 21 04/27/2018   CREATININE 0.92 04/27/2018   GFRAA >60 04/27/2018   GFRNONAA >60 04/27/2018   Hepatic Function Lab Results  Component Value Date   AST 12 (L) 04/27/2018   ALT 14 04/27/2018   ALBUMIN 4.0 04/27/2018   UDS No results found for: SUMMARY Note: Above Lab results reviewed.  Recent imaging  DG Shoulder Left CLINICAL DATA:  Left shoulder pain  EXAM: LEFT SHOULDER - 2+ VIEW  COMPARISON:  08/04/2017  FINDINGS: Degenerative changes in the left [facility] and glenohumeral joints with joint space narrowing and spurring. No acute bony abnormality. Specifically, no fracture, subluxation, or  dislocation.  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 No acute bony abnormality.  Electronically Signed   By: Rolm Baptise M.D.   On: 06/01/2018 12:48  Assessment  The primary encounter diagnosis was Chronic pain syndrome. Diagnoses of Chronic hip pain (Primary area of Pain) (Right), Chronic shoulder pain (Secondary Area of Pain) (Bilateral) (R>L), and Preop testing were also pertinent to this visit.  Plan of Care  I have discontinued Ashlee Bruce's acyclovir and ranitidine. I am also having her maintain her docusate sodium, glucose blood, ibuprofen, lovastatin, QUEtiapine, metFORMIN, Jardiance, DULoxetine, cyclobenzaprine, and oxyCODONE.  Pharmacotherapy (Medications Ordered): Meds ordered this encounter  Medications  . oxyCODONE (OXY IR/ROXICODONE) 5 MG immediate release tablet    Sig: Take 1 tablet (5 mg total) by mouth every 8 (eight) hours as needed for up to 30 days for severe pain. Must last 30 days.    Dispense:  90 tablet    Refill:  0    Chronic Pain: STOP Act - Not applicable. Fill 1 day early if closed on scheduled refill date. Do not fill until: 06/09/18. To last until: 07/09/18. Instruct to avoid benzodiazepines within 8 hours of opioid.   Orders:  Orders Placed This Encounter  Procedures  . SHOULDER INJECTION    Standing Status:   Future    Standing Expiration Date:   07/04/2018    Scheduling Instructions:     Side: Bilateral     Sedation: No Sedation.     Timeframe: As soon as schedule allows    Order Specific Question:   Where will this procedure be performed?    Answer:   [facility]    Comments:   by Dr. Dossie Arbour  . HIP INJECTION    Standing Status:   Future    Standing Expiration Date:   07/04/2018    Scheduling Instructions:     Side: Right-sided     Sedation: No Sedation.     Timeframe: As soon as schedule allows  . Novel Coronavirus, NAA ([facility])    Send patient to pre-admission testing for collection. Estimated  turn-around time: 72 hrs.    Standing Status:   Future    Standing Expiration Date:   08/04/2018    Order Specific Question:   Known Exposure    Answer:   None.   Follow-up plan:   Return for (1 mo) Med-Mgmt, (Virtual Visit), in addition, Procedure, (no sedation): (B) Shoulder BLK #1 + (R) H.  vs right SI joint BLK #1.   I discussed the assessment and treatment plan with the patient. The patient was provided an opportunity to ask questions and all were answered. The patient agreed with the plan and demonstrated an understanding of the instructions.  Patient advised to call back or seek an in-person evaluation if the symptoms or condition worsens.  Total duration of non-face-to-face encounter: 15 minutes.  Note by: Gaspar Cola, MD Date: 06/04/2018; Time: 12:57 PM  Note: This dictation was prepared with Dragon dictation. Any transcriptional errors that may result from this process are unintentional.  Disclaimer:  * Given the special circumstances of the COVID-19 pandemic, the federal government has announced that the Office for Civil Rights (OCR) will exercise its enforcement discretion and will not impose penalties on physicians using telehealth in the event of noncompliance with regulatory requirements under the [place] (HIPAA) in connection with the good faith provision of telehealth during the ZOXWR-60 national public health emergency. ([place])

## 2018-06-11 ENCOUNTER — Other Ambulatory Visit: Payer: Self-pay

## 2018-06-11 ENCOUNTER — Other Ambulatory Visit
Admission: RE | Admit: 2018-06-11 | Discharge: 2018-06-11 | Disposition: A | Discharge status: Home / Self Care | Payer: Medicare Other | Source: Ambulatory Visit | Attending: Pain Medicine | Admitting: Pain Medicine

## 2018-06-11 DIAGNOSIS — Z1159 Encounter for screening for other viral diseases: Secondary | ICD-10-CM | POA: Insufficient documentation

## 2018-06-12 LAB — NOVEL CORONAVIRUS, NAA (HOSP ORDER, SEND-OUT TO REF LAB; TAT 18-24 HRS): SARS-CoV-2, NAA: NOT DETECTED

## 2018-06-13 NOTE — Patient Instructions (Signed)

## 2018-06-13 NOTE — Progress Notes (Addendum)
Patient's Name: Ashlee Bruce  MRN: [id number]  Referring Provider: Petra Kuba, MD  DOB: 1955/11/21  PCP: Petra Kuba, MD  DOS: 06/14/2018  Note by: Gaspar Cola, MD  Service setting: Ambulatory outpatient  Specialty: Interventional Pain Management  Patient type: Established  Location: [facility] (AMB) Pain Management Facility  Visit type: Interventional Procedure   Primary Reason for Visit: Interventional Pain Management Treatment. CC: Hip Pain (right) and Shoulder Pain (bilateral )  Procedure #1:  Anesthesia, Analgesia, Anxiolysis:  Type: Diagnostic Glenohumeral Joint (shoulder) Injection          Primary Purpose: Diagnostic Region: Superior Shoulder Area Level:  Shoulder Target Area: Glenohumeral Joint (shoulder) Approach: Anterior approach. Laterality: Bilateral  Type: Local Anesthesia Indication(s): Analgesia         Route: Infiltration ([place]/IM) IV Access: Declined Sedation: Declined  Local Anesthetic: Lidocaine 1-2%  Position: Supine   Indications: 1. Chronic shoulder pain (Secondary Area of Pain) (Bilateral) (R>L)   2. Osteoarthritis of shoulders (Bilateral)    Procedure #2:  Anesthesia, Analgesia, Anxiolysis:  Type: Intra-Articular Hip Injection          Primary Purpose: Diagnostic Region: Posterolateral hip joint area. Level: Lower pelvic and hip joint level. Target Area: Superior aspect of the hip joint cavity, going thru the superior portion of the capsular ligament. Approach: Posterolateral approach. Laterality: Right  Type: Local Anesthesia Indication(s): Analgesia         Route: Infiltration ([place]/IM) IV Access: Declined Sedation: Declined  Local Anesthetic: Lidocaine 1-2%  Position: Lateral Decubitus with bad side up Prepped Area: Entire Posterolateral hip area. Prepping solution: DuraPrep (Iodine Povacrylex [0.7% available iodine] and Isopropyl Alcohol, 74% w/w)   Indications: 1. Chronic hip pain (Primary area of Pain) (Right)   2.  Osteoarthritis of hip (Right)    Pain Score: Pre-procedure: 8 /10 Post-procedure: (0 shoulders and hip)/10  Pre-op Assessment:  Ashlee Bruce is a 63 y.o. (year old), female patient, seen today for interventional treatment. She  has a past surgical history that includes Cholecystectomy; Breast surgery; Knee Arthroplasty; Thyroid surgery; Ectopic pregnancy surgery; Wrist surgery; Anterior cervical decomp/discectomy fusion (N/A, 07/26/2012); Joint replacement (Right, 2007); and Knee arthroscopy. Ashlee Bruce has a current medication list which includes the following prescription(s): acyclovir, chlorthalidone, cyclobenzaprine, docusate sodium, duloxetine, glucose blood, ibuprofen, jardiance, lovastatin, metformin, nitrofurantoin, oxycodone, potassium chloride, and quetiapine, and the following Facility-Administered Medications: iohexol and ropivacaine (pf) 2 mg/ml (0.2%). Her primarily concern today is the Hip Pain (right) and Shoulder Pain (bilateral )  Initial Vital Signs:  Pulse/HCG Rate: 93ECG Heart Rate: 82 Temp: 98.4 F (36.9 C) Resp: 16 BP: 139/86 SpO2: 99 %  BMI: Estimated body mass index is 26.15 kg/m as calculated from the following:   Height as of this encounter: 5\' 8"  (1.727 m).   Weight as of this encounter: 172 lb (78 kg).  Risk Assessment: Allergies: Reviewed. She is allergic to raloxifene.  Allergy Precautions: None required Coagulopathies: Reviewed. None identified.  Blood-thinner therapy: None at this time Active Infection(s): Reviewed. None identified. Ashlee Bruce is afebrile  Site Confirmation: Ashlee Bruce was asked to confirm the procedure and laterality before marking the site Procedure checklist: Completed Consent: Before the procedure and under the influence of no sedative(s), amnesic(s), or anxiolytics, the patient was informed of the treatment options, risks and possible complications. To fulfill our ethical and legal obligations, as recommended by the American Medical  Association's Code of Ethics, I have informed the patient of my clinical impression; the nature and purpose  of the treatment or procedure; the risks, benefits, and possible complications of the intervention; the alternatives, including doing nothing; the risk(s) and benefit(s) of the alternative treatment(s) or procedure(s); and the risk(s) and benefit(s) of doing nothing. The patient was provided information about the general risks and possible complications associated with the procedure. These may include, but are not limited to: failure to achieve desired goals, infection, bleeding, organ or nerve damage, allergic reactions, paralysis, and death. In addition, the patient was informed of those risks and complications associated to the procedure, such as failure to decrease pain; infection; bleeding; organ or nerve damage with subsequent damage to sensory, motor, and/or autonomic systems, resulting in permanent pain, numbness, and/or weakness of one or several areas of the body; allergic reactions; (i.e.: anaphylactic reaction); and/or death. Furthermore, the patient was informed of those risks and complications associated with the medications. These include, but are not limited to: allergic reactions (i.e.: anaphylactic or anaphylactoid reaction(s)); adrenal axis suppression; blood sugar elevation that in diabetics may result in ketoacidosis or comma; water retention that in patients with history of congestive heart failure may result in shortness of breath, pulmonary edema, and decompensation with resultant heart failure; weight gain; swelling or edema; medication-induced neural toxicity; particulate matter embolism and blood vessel occlusion with resultant organ, and/or nervous system infarction; and/or aseptic necrosis of one or more joints. Finally, the patient was informed that Medicine is not an exact science; therefore, there is also the possibility of unforeseen or unpredictable risks and/or possible  complications that may result in a catastrophic outcome. The patient indicated having understood very clearly. We have given the patient no guarantees and we have made no promises. Enough time was given to the patient to ask questions, all of which were answered to the patient's satisfaction. Ashlee Bruce has indicated that she wanted to continue with the procedure. Attestation: I, the ordering provider, attest that I have discussed with the patient the benefits, risks, side-effects, alternatives, likelihood of achieving goals, and potential problems during recovery for the procedure that I have provided informed consent. Date  Time: 06/14/2018 10:28 AM  Pre-Procedure Preparation:  Monitoring: As per clinic protocol. Respiration, ETCO2, SpO2, BP, heart rate and rhythm monitor placed and checked for adequate function Safety Precautions: Patient was assessed for positional comfort and pressure points before starting the procedure. Time-out: I initiated and conducted the "Time-out" before starting the procedure, as per protocol. The patient was asked to participate by confirming the accuracy of the "Time Out" information. Verification of the correct person, site, and procedure were performed and confirmed by me, the nursing staff, and the patient. "Time-out" conducted as per Joint Commission's Universal Protocol (UP.01.01.01). Time: 1136  Description of Procedure #1:  Area Prepped: Entire shoulder Area Prepping solution: DuraPrep (Iodine Povacrylex [0.7% available iodine] and Isopropyl Alcohol, 74% w/w) Safety Precautions: Aspiration looking for blood return was conducted prior to all injections. At no point did we inject any substances, as a needle was being advanced. No attempts were made at seeking any paresthesias. Safe injection practices and needle disposal techniques used. Medications properly checked for expiration dates. SDV (single dose vial) medications used. Description of the Procedure: Protocol  guidelines were followed. The patient was placed in position over the procedure table. The target area was identified and the area prepped in the usual manner. Skin & deeper tissues infiltrated with local anesthetic. Appropriate amount of time allowed to pass for local anesthetics to take effect. The procedure needles were then advanced to the target  area. Proper needle placement secured. Negative aspiration confirmed. Solution injected in intermittent fashion, asking for systemic symptoms every 0.5cc of injectate. The needles were then removed and the area cleansed, making sure to leave some of the prepping solution back to take advantage of its long term bactericidal properties.    Vitals:   06/14/18 1131 06/14/18 1137 06/14/18 1139 06/14/18 1142  BP: (!) 146/91 (!) 140/99 128/76 113/72  Pulse:      Resp: 12 16 16 20   Temp:      TempSrc:      SpO2: 96% 98% 100% 100%  Weight:      Height:        Start Time: 1136 hrs. Materials:  Needle(s) Type: Spinal Needle Gauge: 22G Length: 3.5-in Medication(s): Please see orders for medications and dosing details.  Imaging Guidance (Non-Spinal) for procedure #1:  Type of Imaging Technique: Fluoroscopy Guidance (Non-Spinal) Indication(s): Assistance in needle guidance and placement for procedures requiring needle placement in or near specific anatomical locations not easily accessible without such assistance. Exposure Time: Please see nurses notes. Contrast: None used. Fluoroscopic Guidance: I was personally present during the use of fluoroscopy. "Tunnel Vision Technique" used to obtain the best possible view of the target area. Parallax error corrected before commencing the procedure. "Direction-depth-direction" technique used to introduce the needle under continuous pulsed fluoroscopy. Once target was reached, antero-posterior, oblique, and lateral fluoroscopic projection used confirm needle placement in all planes. Images permanently stored in EMR.  Interpretation: No contrast injected. I personally interpreted the imaging intraoperatively. Adequate needle placement confirmed in multiple planes. Permanent images saved into the patient's record.  Description of Procedure #2:  Safety Precautions: Aspiration looking for blood return was conducted prior to all injections. At no point did we inject any substances, as a needle was being advanced. No attempts were made at seeking any paresthesias. Safe injection practices and needle disposal techniques used. Medications properly checked for expiration dates. SDV (single dose vial) medications used. Description of the Procedure: Protocol guidelines were followed. The patient was placed in position over the fluoroscopy table. The target area was identified and the area prepped in the usual manner. Skin & deeper tissues infiltrated with local anesthetic. Appropriate amount of time allowed to pass for local anesthetics to take effect. The procedure needles were then advanced to the target area. Proper needle placement secured. Negative aspiration confirmed. Solution injected in intermittent fashion, asking for systemic symptoms every 0.5cc of injectate. The needles were then removed and the area cleansed, making sure to leave some of the prepping solution back to take advantage of its long term bactericidal properties. Vitals:   06/14/18 1131 06/14/18 1137 06/14/18 1139 06/14/18 1142  BP: (!) 146/91 (!) 140/99 128/76 113/72  Pulse:      Resp: 12 16 16 20   Temp:      TempSrc:      SpO2: 96% 98% 100% 100%  Weight:      Height:        Start Time: 1136 hrs. End Time: 1142 hrs. Materials:  Needle(s) Type: Spinal Needle Gauge: 22G Length: 5.0-in Medication(s): Please see orders for medications and dosing details.  Imaging Guidance (Non-Spinal) for procedure #2:  Type of Imaging Technique: Fluoroscopy Guidance (Non-Spinal) Indication(s): Assistance in needle guidance and placement for procedures  requiring needle placement in or near specific anatomical locations not easily accessible without such assistance. Exposure Time: Please see nurses notes. Contrast: Before injecting any contrast, we confirmed that the patient did not have an allergy  to iodine, shellfish, or radiological contrast. Once satisfactory needle placement was completed at the desired level, radiological contrast was injected. Contrast injected under live fluoroscopy. No contrast complications. See chart for type and volume of contrast used. Fluoroscopic Guidance: I was personally present during the use of fluoroscopy. "Tunnel Vision Technique" used to obtain the best possible view of the target area. Parallax error corrected before commencing the procedure. "Direction-depth-direction" technique used to introduce the needle under continuous pulsed fluoroscopy. Once target was reached, antero-posterior, oblique, and lateral fluoroscopic projection used confirm needle placement in all planes. Images permanently stored in EMR. Interpretation: I personally interpreted the imaging intraoperatively. Adequate needle placement confirmed in multiple planes. Appropriate spread of contrast into desired area was observed. No evidence of afferent or efferent intravascular uptake. Permanent images saved into the patient's record.  Antibiotic Prophylaxis:   Anti-infectives (From admission, onward)   None     Indication(s): None identified  Post-operative Assessment:  Post-procedure Vital Signs:  Pulse/HCG Rate: 9383 Temp: 98.4 F (36.9 C) Resp: 20 BP: 113/72 SpO2: 100 %  EBL: None  Complications: No immediate post-treatment complications observed by team, or reported by patient.  Note: The patient tolerated the entire procedure well. A repeat set of vitals were taken after the procedure and the patient was kept under observation following institutional policy, for this type of procedure. Post-procedural neurological assessment  was performed, showing return to baseline, prior to discharge. The patient was provided with post-procedure discharge instructions, including a section on how to identify potential problems. Should any problems arise concerning this procedure, the patient was given instructions to immediately contact us, at any time, without hesitation. In any case, we plan to contact the patient by telephone for a follow-up status report regarding this interventional procedure.  Comments:  No additional relevant information.  Plan of Care  Orders:  Orders Placed This Encounter  Procedures  . SHOULDER INJECTION    Scheduling Instructions:     Side: Bilateral     Sedation: No Sedation.     Timeframe: Today    Order Specific Question:   Where will this procedure be performed?    Answer:   [facility]    Comments:   by Dr. Dossie Arbour  . HIP INJECTION    Scheduling Instructions:     Side: Right-sided     Sedation: No Sedation.     Timeframe: Today  . DG PAIN [facility]IN[facility] C-ARM 1-60 MIN NO REPORT    Intraoperative interpretation by procedural physician at [place].    Standing Status:   Standing    Number of Occurrences:   1    Order Specific Question:   Reason for exam:    Answer:   Assistance in needle guidance and placement for procedures requiring needle placement in or near specific anatomical locations not easily accessible without such assistance.  . Provider attestation of informed consent for procedure/surgical case    I, the ordering provider, attest that I have discussed with the patient the benefits, risks, side effects, alternatives, likelihood of achieving goals and potential problems during recovery for the procedure that I have provided informed consent.    Standing Status:   Standing    Number of Occurrences:   1  . Informed Consent Details: Transcribe to consent form and obtain patient signature    Consent Attestation: I, the ordering provider, attest that I have discussed  with the patient the benefits, risks, side-effects, alternatives, likelihood of achieving goals, and potential problems during  recovery for the procedure that I have provided informed consent.    Standing Status:   Standing    Number of Occurrences:   1    Order Specific Question:   Procedure    Answer:   Bilateral Intra-articular shoulder joint injection under fluoroscopic guidance    Order Specific Question:   Surgeon    Answer:   Daniela Siebers A. 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 Informed Consent Details: Transcribe to consent form and obtain patient signature    Consent Attestation: I, the ordering provider, attest that I have discussed with the patient the benefits, risks, side-effects, alternatives, likelihood of achieving goals, and potential problems during recovery for the procedure that I have provided informed consent.    Standing Status:   Standing    Number of Occurrences:   1    Order Specific Question:   Procedure    Answer:   Right-sided intra-articular hip joint injection under fluoroscopic guidance    Order Specific Question:   Surgeon    Answer:   Ryla Cauthon A. Dossie Arbour, MD    Order Specific Question:   Indication/Reason    Answer:   Right hip pain secondary to osteoarthritis   Medications ordered for procedure: Meds ordered this encounter  Medications  . iohexol (OMNIPAQUE) 180 MG/ML injection 10 mL    Must be Myelogram-compatible. If not available, you may substitute with a water-soluble, non-ionic, hypoallergenic, myelogram-compatible radiological contrast medium.  Marland Kitchen lidocaine (XYLOCAINE) 2 % (with pres) injection 400 mg  . ropivacaine (PF) 2 mg/mL (0.2%) (NAROPIN) injection 9 mL  . methylPREDNISolone acetate (DEPO-MEDROL) injection 80 mg  . ropivacaine (PF) 2 mg/mL (0.2%) (NAROPIN) injection 4 mL  . methylPREDNISolone acetate (DEPO-MEDROL) injection 80 mg   Medications  administered: We administered lidocaine, ropivacaine (PF) 2 mg/mL (0.2%), methylPREDNISolone acetate, and methylPREDNISolone acetate.  See the medical record for exact dosing, route, and time of administration.  Disposition: Discharge home  Discharge Date & Time: 06/14/2018; 1145 hrs.   Follow-up plan:   Return in about 2 weeks (around 06/28/2018) for (Virtual), E/M (PP).     Future Appointments  Date Time Provider Hernandez  07/04/2018 11:00 AM Milinda Pointer, MD [facility] None  07/16/2018  1:15 PM Milinda Pointer, MD [facility] None   Primary Care Physician: Petra Kuba, MD Location: [facility] Note by: Gaspar Cola, MD Date: 06/14/2018; Time: 6:46 PM  Disclaimer:  Medicine is not an Chief Strategy Officer. The only guarantee in medicine is that nothing is guaranteed. It is important to note that the decision to proceed with this intervention was based on the information collected from the patient. The Data and conclusions were drawn from the patient's questionnaire, the interview, and the physical examination. Because the information was provided in large part by the patient, it cannot be guaranteed that it has not been purposely or unconsciously manipulated. Every effort has been made to obtain as much relevant data as possible for this evaluation. It is important to note that the conclusions that lead to this procedure are derived in large part from the available data. Always take into account that the treatment will also be dependent on availability of resources and existing treatment guidelines, considered by other Pain Management Practitioners as being common knowledge and practice, at the time of the intervention. For Medico-Legal purposes, it is also important to point out that variation  in procedural techniques and pharmacological choices are the acceptable norm. The indications, contraindications, technique, and results of the above  procedure should only be interpreted and judged by a Board-Certified Interventional Pain Specialist with extensive familiarity and expertise in the same exact procedure and technique.

## 2018-06-14 ENCOUNTER — Other Ambulatory Visit: Payer: Self-pay

## 2018-06-14 ENCOUNTER — Ambulatory Visit (HOSPITAL_BASED_OUTPATIENT_CLINIC_OR_DEPARTMENT_OTHER): Payer: Medicare Other | Admitting: Pain Medicine

## 2018-06-14 ENCOUNTER — Encounter: Payer: Self-pay | Admitting: Pain Medicine

## 2018-06-14 ENCOUNTER — Ambulatory Visit
Admission: RE | Admit: 2018-06-14 | Discharge: 2018-06-14 | Disposition: A | Discharge status: Home / Self Care | Payer: Medicare Other | Source: Ambulatory Visit | Attending: Pain Medicine | Admitting: Pain Medicine

## 2018-06-14 VITALS — BP 113/72 | HR 93 | Temp 98.4°F | Resp 20 | Ht 68.0 in | Wt 172.0 lb

## 2018-06-14 DIAGNOSIS — G8929 Other chronic pain: Secondary | ICD-10-CM | POA: Insufficient documentation

## 2018-06-14 DIAGNOSIS — M25512 Pain in left shoulder: Secondary | ICD-10-CM | POA: Insufficient documentation

## 2018-06-14 DIAGNOSIS — M25551 Pain in right hip: Secondary | ICD-10-CM

## 2018-06-14 DIAGNOSIS — M19011 Primary osteoarthritis, right shoulder: Secondary | ICD-10-CM

## 2018-06-14 DIAGNOSIS — M19012 Primary osteoarthritis, left shoulder: Secondary | ICD-10-CM

## 2018-06-14 DIAGNOSIS — M25511 Pain in right shoulder: Secondary | ICD-10-CM

## 2018-06-14 DIAGNOSIS — M1611 Unilateral primary osteoarthritis, right hip: Secondary | ICD-10-CM | POA: Diagnosis not present

## 2018-06-14 MED ORDER — IOHEXOL 180 MG/ML  SOLN: 10.0000 mL | Freq: Once | INTRAMUSCULAR | Status: DC

## 2018-06-14 MED ORDER — METHYLPREDNISOLONE ACETATE 80 MG/ML IJ SUSP
80.0000 mg | Freq: Once | INTRAMUSCULAR | Status: AC
  Administered 2018-06-14: 11:00:00 80 mg via INTRA_ARTICULAR
  Filled 2018-06-14: qty 1

## 2018-06-14 MED ORDER — METHYLPREDNISOLONE ACETATE 80 MG/ML IJ SUSP
80.0000 mg | Freq: Once | INTRAMUSCULAR | Status: AC
  Administered 2018-06-14: 12:00:00 80 mg via INTRA_ARTICULAR
  Filled 2018-06-14: qty 1

## 2018-06-14 MED ORDER — LIDOCAINE HCL 2 % IJ SOLN
20.0000 mL | Freq: Once | INTRAMUSCULAR | Status: AC
  Administered 2018-06-14: 400 mg
  Filled 2018-06-14: qty 160

## 2018-06-14 MED ORDER — ROPIVACAINE HCL 2 MG/ML IJ SOLN
9.0000 mL | Freq: Once | INTRAMUSCULAR | Status: AC
  Administered 2018-06-14: 11:00:00 10 mL via INTRA_ARTICULAR
  Filled 2018-06-14: qty 10

## 2018-06-14 MED ORDER — ROPIVACAINE HCL 2 MG/ML IJ SOLN
4.0000 mL | Freq: Once | INTRAMUSCULAR | Status: DC
  Filled 2018-06-14: qty 10

## 2018-06-15 ENCOUNTER — Telehealth: Payer: Self-pay

## 2018-06-15 NOTE — Telephone Encounter (Signed)
Post procedure phone call.  Patient states she is doing great 

## 2018-07-03 ENCOUNTER — Telehealth: Payer: Self-pay

## 2018-07-03 ENCOUNTER — Telehealth: Payer: Self-pay | Admitting: *Deleted

## 2018-07-03 ENCOUNTER — Encounter: Payer: Self-pay | Admitting: Pain Medicine

## 2018-07-03 NOTE — Telephone Encounter (Signed)
Pt returning call from nurse

## 2018-07-03 NOTE — Telephone Encounter (Signed)
Attempted to call for pre appointment assessment. Message left. 

## 2018-07-04 ENCOUNTER — Ambulatory Visit: Payer: Medicare Other | Attending: Pain Medicine | Admitting: Pain Medicine

## 2018-07-04 ENCOUNTER — Other Ambulatory Visit: Payer: Self-pay

## 2018-07-04 DIAGNOSIS — M792 Neuralgia and neuritis, unspecified: Secondary | ICD-10-CM

## 2018-07-04 DIAGNOSIS — M19011 Primary osteoarthritis, right shoulder: Secondary | ICD-10-CM | POA: Diagnosis not present

## 2018-07-04 DIAGNOSIS — M961 Postlaminectomy syndrome, not elsewhere classified: Secondary | ICD-10-CM | POA: Diagnosis not present

## 2018-07-04 DIAGNOSIS — M19012 Primary osteoarthritis, left shoulder: Secondary | ICD-10-CM

## 2018-07-04 DIAGNOSIS — G8929 Other chronic pain: Secondary | ICD-10-CM

## 2018-07-04 DIAGNOSIS — G894 Chronic pain syndrome: Secondary | ICD-10-CM

## 2018-07-04 DIAGNOSIS — M25551 Pain in right hip: Secondary | ICD-10-CM

## 2018-07-04 DIAGNOSIS — M25512 Pain in left shoulder: Secondary | ICD-10-CM

## 2018-07-04 DIAGNOSIS — M25511 Pain in right shoulder: Secondary | ICD-10-CM | POA: Diagnosis not present

## 2018-07-04 MED ORDER — GABAPENTIN 100 MG PO CAPS: ORAL_CAPSULE | ORAL | 0 refills | Status: DC

## 2018-07-04 MED ORDER — OXYCODONE HCL 5 MG PO TABS: 5.0000 mg | ORAL_TABLET | Freq: Three times a day (TID) | ORAL | 0 refills | Status: DC | PRN

## 2018-07-04 NOTE — Patient Instructions (Signed)
____________________________________________________________________________________________  Preparing for Procedure with Sedation  Procedure appointments are limited to planned procedures: . No Prescription Refills. . No disability issues will be discussed. . No medication changes will be discussed.  Instructions: . Oral Intake: Do not eat or drink anything for at least 8 hours prior to your procedure. . Transportation: Public transportation is not allowed. Bring an adult driver. The driver must be physically present in our waiting room before any procedure can be started. . Physical Assistance: Bring an adult physically capable of assisting you, in the event you need help. This adult should keep you company at home for at least 6 hours after the procedure. . Blood Pressure Medicine: Take your blood pressure medicine with a sip of water the morning of the procedure. . Blood thinners: Notify our staff if you are taking any blood thinners. Depending on which one you take, there will be specific instructions on how and when to stop it. . Diabetics on insulin: Notify the staff so that you can be scheduled 1st case in the morning. If your diabetes requires high dose insulin, take only  of your normal insulin dose the morning of the procedure and notify the staff that you have done so. . Preventing infections: Shower with an antibacterial soap the morning of your procedure. . Build-up your immune system: Take 1000 mg of Vitamin C with every meal (3 times a day) the day prior to your procedure. . Antibiotics: Inform the staff if you have a condition or reason that requires you to take antibiotics before dental procedures. . Pregnancy: If you are pregnant, call and cancel the procedure. . Sickness: If you have a cold, fever, or any active infections, call and cancel the procedure. . Arrival: You must be in the facility at least 30 minutes prior to your scheduled procedure. . Children: Do not bring  children with you. . Dress appropriately: Bring dark clothing that you would not mind if they get stained. . Valuables: Do not bring any jewelry or valuables.  Reasons to call and reschedule or cancel your procedure: (Following these recommendations will minimize the risk of a serious complication.) . Surgeries: Avoid having procedures within 2 weeks of any surgery. (Avoid for 2 weeks before or after any surgery). . Flu Shots: Avoid having procedures within 2 weeks of a flu shots or . (Avoid for 2 weeks before or after immunizations). . Barium: Avoid having a procedure within 7-10 days after having had a radiological study involving the use of radiological contrast. (Myelograms, Barium swallow or enema study). . Heart attacks: Avoid any elective procedures or surgeries for the initial 6 months after a "Myocardial Infarction" (Heart Attack). . Blood thinners: It is imperative that you stop these medications before procedures. Let us know if you if you take any blood thinner.  . Infection: Avoid procedures during or within two weeks of an infection (including chest colds or gastrointestinal problems). Symptoms associated with infections include: Localized redness, fever, chills, night sweats or profuse sweating, burning sensation when voiding, cough, congestion, stuffiness, runny nose, sore throat, diarrhea, nausea, vomiting, cold or Flu symptoms, recent or current infections. It is specially important if the infection is over the area that we intend to treat. . Heart and lung problems: Symptoms that may suggest an active cardiopulmonary problem include: cough, chest pain, breathing difficulties or shortness of breath, dizziness, ankle swelling, uncontrolled high or unusually low blood pressure, and/or palpitations. If you are experiencing any of these symptoms, cancel your procedure and contact   your primary care physician for an evaluation.  Remember:  Regular Business hours are:  Monday to Thursday  8:00 AM to 4:00 PM  Provider's Schedule: Ashlee Pointer, MD:  Procedure days: Tuesday and Thursday 7:30 AM to 4:00 PM  Ashlee Santa, MD:  Procedure days: Monday and Wednesday 7:30 AM to 4:00 PM ____________________________________________________________________________________________   ____________________________________________________________________________________________  Initial Gabapentin Titration  Medication used: Gabapentin (Generic Name) or Neurontin (Brand Name) 100 mg tablets/capsules  Reasons to stop increasing the dose:  Reason 1: You get good relief of symptoms, in which case there is no need to increase the daily dose any further.    Reason 2: You develop some side effects, such as sleeping all of the time, difficulty concentrating, or becoming disoriented, in which case you need to go down on the dose, to the prior level, where you were not experiencing any side effects. Stay on that dose longer, to allow more time for your body to get use it, before attempting to increase it again.   Steps: Step 1: Start by taking 1 (one) tablet at bedtime x 7 (seven) days.  Step 2: After being on 1 (one) tablet for 7 (seven) days, then increase it to 2 (two) tablets at bedtime for another 7 (seven) days.  Step 3: Next, after being on 2 (two) tablets at bedtime for 7 (seven) days, then increase it to 3 (three) tablets at bedtime, and stay on that dose until you see your doctor.  Reasons to stop increasing the dose: Reason 1: You get good relief of symptoms, in which case there is no need to increase the daily dose any further.  Reason 2: You develop some side effects, such as sleeping all of the time, difficulty concentrating, or becoming disoriented, in which case you need to go down on the dose, to the prior level, where you were not experiencing any side effects. Stay on that dose longer, to allow more time for your body to get use it, before attempting to increase it  again.  Endpoint: Once you have reached the maximum dose you can tolerate without side-effects, contact your physician so as to evaluate the results of the regimen.   Questions: Feel free to contact us for any questions or problems at [phone] ____________________________________________________________________________________________

## 2018-07-04 NOTE — Progress Notes (Signed)
Pain Management Virtual Encounter Note - Virtual Visit via Telephone Telehealth (real-time audio visits between healthcare provider and patient).   Patient's Phone No. & Preferred Pharmacy:  [phone] (home); [phone] (mobile); (Preferred) [phone] [email or web address]  [place], [place] [address] [place] [address] [facility] [address] [place] [address] Phone: [phone] Fax: [phone]    Pre-screening note:  Our staff contacted Ms. Mondo and offered her an "in person", "face-to-face" appointment versus a telephone encounter. She indicated preferring the telephone encounter, at this time.   Reason for Virtual Visit: COVID-19*  Social distancing based on CDC and AMA recommendations.   I contacted [name] on 07/04/2018 via telephone.      I clearly identified myself as Gaspar Cola, MD. I verified that I was speaking with the correct person using two identifiers (Name: ILSA BONELLO, and date of birth: [birth year]).  Advanced Informed Consent I sought verbal advanced consent from [name] for virtual visit interactions. I informed Ms. Cerrone of possible security and privacy concerns, risks, and limitations associated with providing "not-in-person" medical evaluation and management services. I also informed Ms. Boteler of the availability of "in-person" appointments. Finally, I informed her that there would be a charge for the virtual visit and that she could be  personally, fully or partially, financially responsible for it. Ms. Lish expressed understanding and agreed to proceed.   Historic Elements   Ms. CIELA MAHAJAN is a 63 y.o. year old, female patient evaluated today after her last encounter by our practice on 07/03/2018. Ms. Walstad  has a past medical history of Bipolar disorder ([place]), Depression, Diabetes mellitus without complication ([place]), GERD (gastroesophageal reflux disease), Hypertension, Shortness of  breath, and Transfusion history. She also  has a past surgical history that includes Cholecystectomy; Breast surgery; Knee Arthroplasty; Thyroid surgery; Ectopic pregnancy surgery; Wrist surgery; Anterior cervical decomp/discectomy fusion (N/A, 07/26/2012); Joint replacement (Right, 2007); and Knee arthroscopy. Ms. Marton has a current medication list which includes the following prescription(s): acyclovir, chlorthalidone, cyclobenzaprine, docusate sodium, duloxetine, glucose blood, ibuprofen, jardiance, lovastatin, metformin, oxycodone, potassium chloride, quetiapine, and gabapentin. She  reports that she has never smoked. She has never used smokeless tobacco. She reports current alcohol use of about 1.0 standard drinks of alcohol per week. She reports that she does not use drugs. Ms. Gashi is allergic to raloxifene.   HPI  Today, she is being contacted for both, medication management and a post-procedure assessment.  The patient indicates that she is still pending a right hip replacement.  Unfortunately, this was postponed secondary to the COVID-19 pandemic.  Fortunately she is not having any problems with her left hip.  In terms of her shoulder, she indicates that the injections provided her with 8 days of complete pain relief.  Unfortunately, this wore off and the pain is back to square 1.  At this point, since the intra-articular injection did not provide her with any significant long-term benefit, we will go ahead and plan on doing a diagnostic bilateral suprascapular nerve block under fluoroscopic guidance.  Should the patient get good relief of the pain from the local anesthetic, we will consider radiofrequency ablation to provide her with longer lasting benefit.  The patient was informed of the plan and she understood and agreed.  Today the patient asked if I could increase her pain medication but I declined this request due to the fact that she is high risk for substance use disorder.  She has a  prior  history of cocaine dependence and schizoeffective disorder.  Instead, since she has a neurogenic component to her pain, I asked her whether or not she had tried gabapentin and Lyrica in the past.  She indicated that she had and apparently she was getting some benefit from it but she also indicated that for some unknown reason this was discontinued by Dr. Primus Bravo.  Currently the patient does not seem to be having any kind of problems with her liver or kidney and therefore today I will go ahead and start a trial again.  Since it has been sometime since she last took it I will start her at a low dose and increase it as tolerated.  Post-Procedure Evaluation  Procedure: Diagnostic bilateral intra-articular shoulder joint injection #1 + right intra-articular hip joint injection #1 under fluoroscopic guidance and no sedation Pre-procedure pain level:  8/10 Post-procedure: 0/10          Sedation: None.  Effectiveness during initial hour after procedure(Ultra-Short Term Relief): 100 %   Local anesthetic used: Long-acting (4-6 hours) Effectiveness: Defined as any analgesic benefit obtained secondary to the administration of local anesthetics. This carries significant diagnostic value as to the etiological location, or anatomical origin, of the pain. Duration of benefit is expected to coincide with the duration of the local anesthetic used.  Effectiveness during initial 4-6 hours after procedure(Short-Term Relief): 100 %   Long-term benefit: Defined as any relief past the pharmacologic duration of the local anesthetics.  Effectiveness past the initial 6 hours after procedure(Long-Term Relief): 100 % x 8 days.  The patient indicates that for a period of 8 days she had no hip or shoulder pain.  Unfortunately the pain has returned on both of them.  Current benefits: Defined as benefit that persist at this time.   Analgesia:  5% improvement of her overall pain. Function: Back to baseline ROM: Back to baseline   Pharmacotherapy Assessment  Analgesic: Oxycodone IR 5 mg every 8 hours (15 mg/day of oxycodone) MME/day: 22.5 mg/day.   Monitoring: Pharmacotherapy: No side-effects or adverse reactions reported. [place] PMP: PDMP reviewed during this encounter.       Compliance: No problems identified. Effectiveness: Clinically acceptable. Plan: Refer to "POC".  Pertinent Labs   SAFETY SCREENING Profile Lab Results  Component Value Date   SARSCOV2NAA NOT DETECTED 06/11/2018   COVIDSOURCE NASOPHARYNGEAL 06/11/2018   STAPHAUREUS NEGATIVE 04/25/2013   MRSAPCR NEGATIVE 04/25/2013   Renal Function Lab Results  Component Value Date   BUN 21 04/27/2018   CREATININE 0.92 04/27/2018   GFRAA >60 04/27/2018   GFRNONAA >60 04/27/2018   Hepatic Function Lab Results  Component Value Date   AST 12 (L) 04/27/2018   ALT 14 04/27/2018   ALBUMIN 4.0 04/27/2018   UDS No results found for: SUMMARY Note: Above Lab results reviewed.  Recent imaging  DG PAIN CLINIC C-ARM 1-60 MIN NO REPORT Fluoro was used, but no Radiologist interpretation will be provided.  Please refer to "NOTES" tab for provider progress note.     Assessment  The primary encounter diagnosis was Chronic hip pain (Primary area of Pain) (Right). Diagnoses of Chronic shoulder pain (Secondary Area of Pain) (Bilateral) (R>L), Osteoarthritis of shoulders (Bilateral), Failed back surgical syndrome, Chronic pain syndrome, and Neurogenic pain were also pertinent to this visit.  Plan of Care  I have discontinued Alizandra M. Tregre's nitrofurantoin. I have also changed her oxyCODONE. Additionally, I am having her start on gabapentin. Lastly, I am having her maintain her docusate  sodium, glucose blood, ibuprofen, lovastatin, QUEtiapine, metFORMIN, Jardiance, DULoxetine, cyclobenzaprine, acyclovir, potassium chloride, and chlorthalidone.  Pharmacotherapy (Medications Ordered): Meds ordered this encounter  Medications  . oxyCODONE (OXY IR/ROXICODONE)  5 MG immediate release tablet    Sig: Take 1 tablet (5 mg total) by mouth every 8 (eight) hours as needed for severe pain. Must last 30 days.    Dispense:  90 tablet    Refill:  0    Chronic Pain: STOP Act - Not applicable. Fill 1 day early if closed on scheduled refill date. Do not fill until: 07/09/2018. To last until: 08/08/2018. Instruct to avoid benzodiazepines within 8 hours of opioid.  Marland Kitchen gabapentin (NEURONTIN) 100 MG capsule    Sig: Take 1 cap (100 mg) PO HS x 7 days, then 2 caps PO HS x 7 days, then increase to 3 caps PO HS    Dispense:  90 capsule    Refill:  0    Fill one day early if pharmacy is closed on scheduled refill date. May substitute for generic if available.   Orders:  Orders Placed This Encounter  Procedures  . SUPRASCAPULAR NERVE BLOCK    For shoulder pain.    Standing Status:   Future    Standing Expiration Date:   08/03/2018    Scheduling Instructions:     Purpose: Diagnostic     Laterality: Bilateral     Level(s): Suprascapular notch     Sedation: Patient's choice.     Scheduling Timeframe: As permitted by the schedule    Order Specific Question:   Where will this procedure be performed?    Answer:   [facility]   Follow-up plan:   Return for Procedure (w/ sedation): (B) Suprascapular NB #1.      Interventional management considerations: The patient is currently awaiting to have a right total hip replacement by Dr. Mardelle Matte.  This was put on hold due to the coronavirus. Palliative bilateral intra-articular hip joint injection #2  Diagnostic bilateral intra-articular shoulder joint injection #2  Diagnostic bilateral suprascapular nerve blocks  Possible bilateral suprascapular nerve RFA  Diagnostic right-sided cervical epidural steroid injection  Diagnostic bilateral cervical facet block  Possible bilateral cervical facet RFA  Diagnostic bilateral lumbar facet blocks  Possible bilateral lumbar facet RFA  Diagnostic right-sided lumbar epidural steroid  injection  Diagnostic right-sided versus bilateral transforaminal epidural steroid injections  Diagnostic bilateral sacroiliac joint block  Possible bilateral sacroiliac joint RFA  Diagnostic right-sided genicular nerve block  Possible right-sided genicular nerve RFA     Recent Visits Date Type Provider Dept  06/14/18 Procedure visit Milinda Pointer, MD [facility]  06/04/18 Office Visit Milinda Pointer, MD [facility]  05/10/18 Office Visit Milinda Pointer, MD [facility]  04/26/18 Office Visit Milinda Pointer, MD [facility]  Showing recent visits within past 90 days and meeting all other requirements   Today's Visits Date Type Provider Dept  07/04/18 Office Visit Milinda Pointer, MD [facility]  Showing today's visits and meeting all other requirements   Future Appointments Date Type Provider Dept  07/16/18 Appointment Milinda Pointer, MD [facility]  Showing future appointments within next 90 days and meeting all other requirements   I discussed the assessment and treatment plan with the patient. The patient was provided an opportunity to ask questions and all were answered. The patient agreed with the plan and demonstrated an understanding of the instructions.  Patient advised to call back or seek an in-person  evaluation if the symptoms or condition worsens.  Total duration of non-face-to-face encounter: 15 minutes.  Note by: Gaspar Cola, MD Date: 07/04/2018; Time: 11:35 AM  Note: This dictation was prepared with Dragon dictation. Any transcriptional errors that may result from this process are unintentional.  Disclaimer:  * Given the special circumstances of the COVID-19 pandemic, the federal government has announced that the Office for Civil Rights (OCR) will exercise its enforcement discretion and will not impose penalties on physicians using telehealth in the event of noncompliance with  regulatory requirements under the [place] (HIPAA) in connection with the good faith provision of telehealth during the HSVEX-46 national public health emergency. ([place])

## 2018-07-13 ENCOUNTER — Other Ambulatory Visit
Admission: RE | Admit: 2018-07-13 | Discharge: 2018-07-13 | Disposition: A | Discharge status: Home / Self Care | Payer: Medicare Other | Source: Ambulatory Visit | Attending: Pain Medicine | Admitting: Pain Medicine

## 2018-07-13 ENCOUNTER — Other Ambulatory Visit: Payer: Self-pay

## 2018-07-13 DIAGNOSIS — Z01812 Encounter for preprocedural laboratory examination: Secondary | ICD-10-CM | POA: Insufficient documentation

## 2018-07-13 DIAGNOSIS — Z1159 Encounter for screening for other viral diseases: Secondary | ICD-10-CM | POA: Insufficient documentation

## 2018-07-14 LAB — SARS CORONAVIRUS 2 (TAT 6-24 HRS): SARS Coronavirus 2: NEGATIVE

## 2018-07-16 ENCOUNTER — Ambulatory Visit: Payer: Medicare Other | Admitting: Pain Medicine

## 2018-07-17 ENCOUNTER — Ambulatory Visit (HOSPITAL_BASED_OUTPATIENT_CLINIC_OR_DEPARTMENT_OTHER): Payer: Medicare Other | Admitting: Pain Medicine

## 2018-07-17 ENCOUNTER — Other Ambulatory Visit: Payer: Self-pay

## 2018-07-17 ENCOUNTER — Ambulatory Visit
Admission: RE | Admit: 2018-07-17 | Discharge: 2018-07-17 | Disposition: A | Discharge status: Home / Self Care | Payer: Medicare Other | Source: Ambulatory Visit | Attending: Pain Medicine | Admitting: Pain Medicine

## 2018-07-17 ENCOUNTER — Encounter: Payer: Self-pay | Admitting: Pain Medicine

## 2018-07-17 VITALS — BP 158/101 | HR 82 | Temp 98.2°F | Resp 16 | Ht 68.0 in | Wt 173.0 lb

## 2018-07-17 DIAGNOSIS — M19012 Primary osteoarthritis, left shoulder: Secondary | ICD-10-CM | POA: Diagnosis present

## 2018-07-17 DIAGNOSIS — M25511 Pain in right shoulder: Secondary | ICD-10-CM | POA: Diagnosis not present

## 2018-07-17 DIAGNOSIS — M19011 Primary osteoarthritis, right shoulder: Secondary | ICD-10-CM

## 2018-07-17 DIAGNOSIS — M792 Neuralgia and neuritis, unspecified: Secondary | ICD-10-CM | POA: Insufficient documentation

## 2018-07-17 DIAGNOSIS — G8929 Other chronic pain: Secondary | ICD-10-CM | POA: Diagnosis present

## 2018-07-17 DIAGNOSIS — M25512 Pain in left shoulder: Secondary | ICD-10-CM | POA: Diagnosis present

## 2018-07-17 DIAGNOSIS — G894 Chronic pain syndrome: Secondary | ICD-10-CM | POA: Diagnosis present

## 2018-07-17 MED ORDER — GABAPENTIN 100 MG PO CAPS: ORAL_CAPSULE | ORAL | 0 refills | Status: DC

## 2018-07-17 MED ORDER — METHYLPREDNISOLONE ACETATE 80 MG/ML IJ SUSP
80.0000 mg | Freq: Once | INTRAMUSCULAR | Status: AC
  Administered 2018-07-17: 80 mg
  Filled 2018-07-17: qty 1

## 2018-07-17 MED ORDER — LACTATED RINGERS IV SOLN: 1000.0000 mL | Freq: Once | INTRAVENOUS | Status: DC

## 2018-07-17 MED ORDER — FENTANYL CITRATE (PF) 100 MCG/2ML IJ SOLN: 25.0000 ug | INTRAMUSCULAR | Status: DC | PRN

## 2018-07-17 MED ORDER — LIDOCAINE HCL 2 % IJ SOLN
20.0000 mL | Freq: Once | INTRAMUSCULAR | Status: AC
  Administered 2018-07-17: 400 mg
  Filled 2018-07-17: qty 20

## 2018-07-17 MED ORDER — ROPIVACAINE HCL 2 MG/ML IJ SOLN
9.0000 mL | Freq: Once | INTRAMUSCULAR | Status: AC
  Administered 2018-07-17: 9 mL via PERINEURAL
  Filled 2018-07-17: qty 10

## 2018-07-17 MED ORDER — MIDAZOLAM HCL 5 MG/5ML IJ SOLN: 1.0000 mg | INTRAMUSCULAR | Status: DC | PRN

## 2018-07-17 MED ORDER — OXYCODONE HCL 5 MG PO TABS: 5.0000 mg | ORAL_TABLET | Freq: Three times a day (TID) | ORAL | 0 refills | Status: DC | PRN

## 2018-07-17 NOTE — Patient Instructions (Signed)

## 2018-07-17 NOTE — Progress Notes (Signed)
Patient's Name: Ashlee Bruce  MRN: [id number]  Referring Provider: Milinda Pointer, MD  DOB: 12-25-1955  PCP: Petra Kuba, MD  DOS: 07/17/2018  Note by: Gaspar Cola, MD  Service setting: Ambulatory outpatient  Specialty: Interventional Pain Management  Patient type: Established  Location: [facility] (AMB) Pain Management Facility  Visit type: Interventional Procedure   Primary Reason for Visit: Interventional Pain Management Treatment. CC: Shoulder Pain (bilateral)  Procedure:          Anesthesia, Analgesia, Anxiolysis:  Type: Diagnostic Suprascapular nerve Block #1  Primary Purpose: Diagnostic Region: Posterior Shoulder & Scapular Areas Level: Superior to the scapular spine, in the lateral aspect of the supraspinatus fossa (Suprescapular notch). Target Area: Suprascapular nerve as it passes thru the lower portion of the suprascapular notch. Approach: Posterior percutaneous approach. Laterality: Bilateral  Type: Local Anesthesia Indication(s): Analgesia         Route: Infiltration ([place]/IM) IV Access: Declined Sedation: Declined  Local Anesthetic: Lidocaine 1-2%  Position: Prone   Indications: 1. Chronic shoulder pain (Secondary Area of Pain) (Bilateral) (R>L)   2. Osteoarthritis of shoulders (Bilateral)   3. Osteoarthritis of glenohumeral joint (Left)   4. Osteoarthritis of AC (acromioclavicular) joint (Left)   5. Osteoarthritis of glenohumeral joint (Right)    Pain Score: Pre-procedure: 9 /10 Post-procedure: 0-No pain/10  Pertinent Labs  COVID-19 screennig: Lab Results  Component Value Date   SARSCOV2NAA NEGATIVE 07/13/2018   Pre-op Assessment:  Ashlee Bruce is a 63 y.o. (year old), female patient, seen today for interventional treatment. She  has a past surgical history that includes Cholecystectomy; Breast surgery; Knee Arthroplasty; Thyroid surgery; Ectopic pregnancy surgery; Wrist surgery; Anterior cervical decomp/discectomy fusion (N/A, 07/26/2012); Joint  replacement (Right, 2007); and Knee arthroscopy. Ashlee Bruce has a current medication list which includes the following prescription(s): acyclovir, chlorthalidone, cyclobenzaprine, docusate sodium, duloxetine, glucose blood, ibuprofen, jardiance, lovastatin, metformin, potassium chloride, quetiapine, gabapentin, and oxycodone. Her primarily concern today is the Shoulder Pain (bilateral)  The suprascapular nerve (SSN) is a mixed nerve that provides the motor innervation of the supraspinatus and infraspinatus muscles and the sensory and proprioceptive innervation of the posterior aspect of the glenohumeral joint, as well as the acromioclavicular joint, subacromial bursa, and scapula.  Initial Vital Signs:  Pulse/HCG Rate: 82ECG Heart Rate: 82 Temp: 98.2 F (36.8 C) Resp: 16 BP: (!) 133/96 SpO2: 99 %  BMI: Estimated body mass index is 26.3 kg/m as calculated from the following:   Height as of this encounter: 5\' 8"  (1.727 m).   Weight as of this encounter: 173 lb (78.5 kg).  Risk Assessment: Allergies: Reviewed. She is allergic to raloxifene.  Allergy Precautions: None required Coagulopathies: Reviewed. None identified.  Blood-thinner therapy: None at this time Active Infection(s): Reviewed. None identified. Ashlee Bruce is afebrile  Site Confirmation: Ashlee Bruce was asked to confirm the procedure and laterality before marking the site Procedure checklist: Completed Consent: Before the procedure and under the influence of no sedative(s), amnesic(s), or anxiolytics, the patient was informed of the treatment options, risks and possible complications. To fulfill our ethical and legal obligations, as recommended by the American Medical Association's Code of Ethics, I have informed the patient of my clinical impression; the nature and purpose of the treatment or procedure; the risks, benefits, and possible complications of the intervention; the alternatives, including doing nothing; the risk(s) and  benefit(s) of the alternative treatment(s) or procedure(s); and the risk(s) and benefit(s) of doing nothing. The patient was provided information about the general risks  and possible complications associated with the procedure. These may include, but are not limited to: failure to achieve desired goals, infection, bleeding, organ or nerve damage, allergic reactions, paralysis, and death. In addition, the patient was informed of those risks and complications associated to the procedure, such as failure to decrease pain; infection; bleeding; organ or nerve damage with subsequent damage to sensory, motor, and/or autonomic systems, resulting in permanent pain, numbness, and/or weakness of one or several areas of the body; allergic reactions; (i.e.: anaphylactic reaction); and/or death. Furthermore, the patient was informed of those risks and complications associated with the medications. These include, but are not limited to: allergic reactions (i.e.: anaphylactic or anaphylactoid reaction(s)); adrenal axis suppression; blood sugar elevation that in diabetics may result in ketoacidosis or comma; water retention that in patients with history of congestive heart failure may result in shortness of breath, pulmonary edema, and decompensation with resultant heart failure; weight gain; swelling or edema; medication-induced neural toxicity; particulate matter embolism and blood vessel occlusion with resultant organ, and/or nervous system infarction; and/or aseptic necrosis of one or more joints. Finally, the patient was informed that Medicine is not an exact science; therefore, there is also the possibility of unforeseen or unpredictable risks and/or possible complications that may result in a catastrophic outcome. The patient indicated having understood very clearly. We have given the patient no guarantees and we have made no promises. Enough time was given to the patient to ask questions, all of which were answered to  the patient's satisfaction. [name] has indicated that she wanted to continue with the procedure. Attestation: I, the ordering provider, attest that I have discussed with the patient the benefits, risks, side-effects, alternatives, likelihood of achieving goals, and potential problems during recovery for the procedure that I have provided informed consent. Date   Time: 07/17/2018  9:08 AM  Pre-Procedure Preparation:  Monitoring: As per clinic protocol. Respiration, ETCO2, SpO2, BP, heart rate and rhythm monitor placed and checked for adequate function Safety Precautions: Patient was assessed for positional comfort and pressure points before starting the procedure. Time-out: I initiated and conducted the "Time-out" before starting the procedure, as per protocol. The patient was asked to participate by confirming the accuracy of the "Time Out" information. Verification of the correct person, site, and procedure were performed and confirmed by me, the nursing staff, and the patient. "Time-out" conducted as per Joint Commission's Universal Protocol (UP.01.01.01). Time: 0955  Description of Procedure:          Area Prepped: Entire shoulder Area Prepping solution: DuraPrep (Iodine Povacrylex [0.7% available iodine] and Isopropyl Alcohol, 74% w/w) Safety Precautions: Aspiration looking for blood return was conducted prior to all injections. At no point did we inject any substances, as a needle was being advanced. No attempts were made at seeking any paresthesias. Safe injection practices and needle disposal techniques used. Medications properly checked for expiration dates. SDV (single dose vial) medications used. Description of the Procedure: Protocol guidelines were followed. The patient was placed in position over the procedure table. The target area was identified and the area prepped in the usual manner. Skin & deeper tissues infiltrated with local anesthetic. Appropriate amount of time allowed to pass  for local anesthetics to take effect. The procedure needles were then advanced to the target area. Proper needle placement secured. Negative aspiration confirmed. Solution injected in intermittent fashion, asking for systemic symptoms every 0.5cc of injectate. The needles were then removed and the area cleansed, making sure to leave some of the  prepping solution back to take advantage of its long term bactericidal properties.  Vitals:   07/17/18 0907 07/17/18 0955 07/17/18 1000 07/17/18 1005  BP: (!) 133/96 (!) 148/77 (!) 150/122 (!) 158/101  Pulse: 82     Resp: 16 15 15 16   Temp: 98.2 F (36.8 C)     SpO2: 99% 97% 97% 98%  Weight: 173 lb (78.5 kg)     Height: 5\' 8"  (1.727 m)       Start Time: 0957 hrs. End Time: 1001 hrs. Materials:  Needle(s) Type: Spinal Needle Gauge: 22G Length: 3.5-in Medication(s): Please see orders for medications and dosing details.  Imaging Guidance (Non-Spinal):          Type of Imaging Technique: Fluoroscopy Guidance (Non-Spinal) Indication(s): Assistance in needle guidance and placement for procedures requiring needle placement in or near specific anatomical locations not easily accessible without such assistance. Exposure Time: Please see nurses notes. Contrast: Before injecting any contrast, we confirmed that the patient did not have an allergy to iodine, shellfish, or radiological contrast. Once satisfactory needle placement was completed at the desired level, radiological contrast was injected. Contrast injected under live fluoroscopy. No contrast complications. See chart for type and volume of contrast used. Fluoroscopic Guidance: I was personally present during the use of fluoroscopy. "Tunnel Vision Technique" used to obtain the best possible view of the target area. Parallax error corrected before commencing the procedure. "Direction-depth-direction" technique used to introduce the needle under continuous pulsed fluoroscopy. Once target was reached,  antero-posterior, oblique, and lateral fluoroscopic projection used confirm needle placement in all planes. Images permanently stored in EMR. Interpretation: I personally interpreted the imaging intraoperatively. Adequate needle placement confirmed in multiple planes. Appropriate spread of contrast into desired area was observed. No evidence of afferent or efferent intravascular uptake. Permanent images saved into the patient's record.  Antibiotic Prophylaxis:   Anti-infectives (From admission, onward)   None     Indication(s): None identified  Post-operative Assessment:  Post-procedure Vital Signs:  Pulse/HCG Rate: 8280 Temp: 98.2 F (36.8 C) Resp: 16 BP: (!) 158/101 SpO2: 98 %  EBL: None  Complications: No immediate post-treatment complications observed by team, or reported by patient.  Note: The patient tolerated the entire procedure well. A repeat set of vitals were taken after the procedure and the patient was kept under observation following institutional policy, for this type of procedure. Post-procedural neurological assessment was performed, showing return to baseline, prior to discharge. The patient was provided with post-procedure discharge instructions, including a section on how to identify potential problems. Should any problems arise concerning this procedure, the patient was given instructions to immediately contact us, at any time, without hesitation. In any case, we plan to contact the patient by telephone for a follow-up status report regarding this interventional procedure.  Comments:  No additional relevant information.  Plan of Care  Orders:  Orders Placed This Encounter  Procedures   SUPRASCAPULAR NERVE BLOCK    For shoulder pain.    Scheduling Instructions:     Purpose: Diagnostic     Laterality: Bilateral     Level(s): Suprascapular notch     Sedation: Patient's choice.     Scheduling Timeframe: Today    Order Specific Question:   Where will this  procedure be performed?    Answer:   [facility]   DG PAIN CLIN[facility] C-ARM 1-60 MIN NO REPORT    Intraoperative interpretation by procedural physician at [place].    Standing Status:  Standing    Number of Occurrences:   1    Order Specific Question:   Reason for exam:    Answer:   Assistance in needle guidance and placement for procedures requiring needle placement in or near specific anatomical locations not easily accessible without such assistance.   Provider attestation of informed consent for procedure/surgical case    I, the ordering provider, attest that I have discussed with the patient the benefits, risks, side effects, alternatives, likelihood of achieving goals and potential problems during recovery for the procedure that I have provided informed consent.    Standing Status:   Standing    Number of Occurrences:   1   Informed Consent Details: Transcribe to consent form and obtain patient signature    Standing Status:   Standing    Number of Occurrences:   1    Order Specific Question:   Procedure    Answer:   Bilateral suprascapular nerve block under fluoroscopic guidance.    Order Specific Question:   Surgeon    Answer:   Kathlen Brunswick. 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   Medications ordered for procedure: Meds ordered this encounter  Medications   lidocaine (XYLOCAINE) 2 % (with pres) injection 400 mg   DISCONTD: lactated ringers infusion 1,000 mL   DISCONTD: midazolam (VERSED) 5 MG/5ML injection 1-2 mg    Make sure Flumazenil is available in the pyxis when using this medication. If oversedation occurs, administer 0.2 mg IV over 15 sec. If after 45 sec no response, administer 0.2 mg again over 1 min; may repeat at 1 min  intervals; not to exceed 4 doses (1 mg)   DISCONTD: fentaNYL (SUBLIMAZE) injection 25-50 mcg    Make sure Narcan is available in the pyxis when using this medication. In the event of respiratory depression (RR< 8/min): Titrate NARCAN (naloxone) in increments of 0.1 to 0.2 mg IV at 2-3 minute intervals, until desired degree of reversal.   ropivacaine (PF) 2 mg/mL (0.2%) (NAROPIN) injection 9 mL   methylPREDNISolone acetate (DEPO-MEDROL) injection 80 mg   oxyCODONE (OXY IR/ROXICODONE) 5 MG immediate release tablet    Sig: Take 1 tablet (5 mg total) by mouth every 8 (eight) hours as needed for severe pain. Must last 30 days.    Dispense:  90 tablet    Refill:  0    Chronic Pain: STOP Act - Not applicable. Fill 1 day early if closed on scheduled refill date. Do not fill until: 08/08/2018. To last until: 09/07/2018. Instruct to avoid benzodiazepines within 8 hours of opioid.   gabapentin (NEURONTIN) 100 MG capsule    Sig: Take 3 capsules (300 mg total) by mouth at bedtime AND 1 capsule (100 mg total) 3 (three) times daily.    Dispense:  180 capsule    Refill:  0    Fill one day early if pharmacy is closed on scheduled refill date. May substitute for generic if available.   Medications administered: We administered lidocaine, ropivacaine (PF) 2 mg/mL (0.2%), and methylPREDNISolone acetate.  See the medical record for exact dosing, route, and time of administration.  Follow-up plan:   Return for (VV), 2 wk PP-F/U Eval.       Interventional management considerations: Considering:   NOTE: Currently awaiting to  have a right total hip replacement by Dr. Mardelle Matte. Diagnostic bilateral suprascapular nerve blocks  Possible bilateral suprascapular nerve RFA  Diagnostic right-sided cervical epidural steroid injection  Diagnostic bilateral cervical facet block  Possible bilateral cervical facet RFA  Diagnostic bilateral lumbar facet blocks  Possible bilateral lumbar facet RFA  Diagnostic right-sided  lumbar epidural steroid injection  Diagnostic right-sided versus bilateral transforaminal epidural steroid injections  Diagnostic bilateral sacroiliac joint block  Possible bilateral sacroiliac joint RFA  Diagnostic right-sided genicular nerve block  Possible right-sided genicular nerve RFA    PRN Procedures:   Palliative bilateral intra-articular hip joint injection #2  Palliative bilateral intra-articular shoulder joint injection #2     Recent Visits Date Type Provider Dept  07/04/18 Office Visit Milinda Pointer, MD [facility]  06/14/18 Procedure visit Milinda Pointer, MD [facility]  06/04/18 Office Visit Milinda Pointer, MD [facility]  05/10/18 Office Visit Milinda Pointer, MD [facility]  04/26/18 Office Visit Milinda Pointer, MD [facility]  Showing recent visits within past 90 days and meeting all other requirements   Today's Visits Date Type Provider Dept  07/17/18 Procedure visit Milinda Pointer, MD [facility]  Showing today's visits and meeting all other requirements   Future Appointments Date Type Provider Dept  08/07/18 Appointment Milinda Pointer, MD [facility]  Showing future appointments within next 90 days and meeting all other requirements   Disposition: Discharge home  Discharge Date & Time: 07/17/2018; 1005 hrs.   Primary Care Physician: Petra Kuba, MD Location: [facility] Note by: Gaspar Cola, MD Date: 07/17/2018; Time: 10:35 AM  Disclaimer:  Medicine is not an exact science. The only guarantee in medicine is that nothing is guaranteed. It is important to note that the decision to proceed with this intervention was based on the information collected from the patient. The Data and conclusions were drawn from the patient's questionnaire, the interview, and the physical examination. Because the information was provided in  large part by the patient, it cannot be guaranteed that it has not been purposely or unconsciously manipulated. Every effort has been made to obtain as much relevant data as possible for this evaluation. It is important to note that the conclusions that lead to this procedure are derived in large part from the available data. Always take into account that the treatment will also be dependent on availability of resources and existing treatment guidelines, considered by other Pain Management Practitioners as being common knowledge and practice, at the time of the intervention. For Medico-Legal purposes, it is also important to point out that variation in procedural techniques and pharmacological choices are the acceptable norm. The indications, contraindications, technique, and results of the above procedure should only be interpreted and judged by a Board-Certified Interventional Pain Specialist with extensive familiarity and expertise in the same exact procedure and technique.

## 2018-07-18 ENCOUNTER — Telehealth: Payer: Self-pay | Admitting: *Deleted

## 2018-07-18 NOTE — Telephone Encounter (Signed)
No problems post procedure. 

## 2018-08-06 ENCOUNTER — Encounter: Payer: Self-pay | Admitting: Pain Medicine

## 2018-08-07 ENCOUNTER — Ambulatory Visit: Payer: Medicare Other | Attending: Pain Medicine | Admitting: Pain Medicine

## 2018-08-07 ENCOUNTER — Other Ambulatory Visit: Payer: Self-pay

## 2018-08-07 DIAGNOSIS — M792 Neuralgia and neuritis, unspecified: Secondary | ICD-10-CM

## 2018-08-07 DIAGNOSIS — G894 Chronic pain syndrome: Secondary | ICD-10-CM | POA: Diagnosis not present

## 2018-08-07 MED ORDER — OXYCODONE HCL 5 MG PO TABS: 5.0000 mg | ORAL_TABLET | Freq: Three times a day (TID) | ORAL | 0 refills | Status: DC | PRN

## 2018-08-07 MED ORDER — GABAPENTIN 100 MG PO CAPS: ORAL_CAPSULE | ORAL | 0 refills | Status: DC

## 2018-08-07 NOTE — Progress Notes (Signed)
Pain Management Virtual Encounter Note - Virtual Visit via Telephone Telehealth (real-time audio visits between healthcare provider and patient).   Patient's Phone No. & Preferred Pharmacy:  [phone] (home); [phone] (mobile); (Preferred) [phone] [email or web address]  [place], [place] [address] [place] [address] [facility] [place] [address] Phone: [phone] Fax: [phone]    Pre-screening note:  Our staff contacted [name] and offered her an "in person", "face-to-face" appointment versus a telephone encounter. She indicated preferring the telephone encounter, at this time.   Reason for Virtual Visit: COVID-19*  Social distancing based on CDC and AMA recommendations.   I contacted Ashlee Bruce on 08/07/2018 via telephone.      I clearly identified myself as Gaspar Cola, MD. I verified that I was speaking with the correct person using two identifiers (Name: Ashlee Bruce, and date of birth: 07/31/1955).  Advanced Informed Consent I sought verbal advanced consent from Ashlee Bruce for virtual visit interactions. I informed Ms. Elk of possible security and privacy concerns, risks, and limitations associated with providing "not-in-person" medical evaluation and management services. I also informed Ms. Freelove of the availability of "in-person" appointments. Finally, I informed her that there would be a charge for the virtual visit and that she could be  personally, fully or partially, financially responsible for it. Ms. Amero expressed understanding and agreed to proceed.   Historic Elements   Ashlee Bruce is a 63 y.o. year old, female patient evaluated today after her last encounter by our practice on 07/18/2018. Ms. Cornell  has a past medical history of Bipolar disorder ([place]), Depression, Diabetes mellitus without complication ([place]), GERD (gastroesophageal reflux disease), Hypertension, Shortness of  breath, and Transfusion history. She also  has a past surgical history that includes Cholecystectomy; Breast surgery; Knee Arthroplasty; Thyroid surgery; Ectopic pregnancy surgery; Wrist surgery; Anterior cervical decomp/discectomy fusion (N/A, 07/26/2012); Joint replacement (Right, 2007); and Knee arthroscopy. Ms. Steier has a current medication list which includes the following prescription(s): acyclovir, chlorthalidone, cyclobenzaprine, docusate sodium, duloxetine, gabapentin, glucose blood, ibuprofen, jardiance, lovastatin, metformin, oxycodone, potassium chloride, and quetiapine. She  reports that she has never smoked. She has never used smokeless tobacco. She reports current alcohol use of about 1.0 standard drinks of alcohol per week. She reports that she does not use drugs. Ms. Grassi is allergic to raloxifene.   HPI  Today, she is being contacted for both, medication management and a post-procedure assessment.  The patient indicates doing really well with the diagnostic bilateral suprascapular nerve block.  She still having excellent range of motion and her pain is about 75% gone.  Apparently there was a misunderstanding when she spoke to the nurse and they recorded 25% that the nurse wrote down was actually the amount of pain that she has left.  In any case, as soon as the pain returns, we will repeat a diagnostic bilateral suprascapular nerve block and if she again gets similar results, then we will plan on doing a radiofrequency ablation.  Post-Procedure Evaluation  Procedure: Diagnostic bilateral suprascapular nerve block #1 under fluoroscopic guidance, no sedation Pre-procedure pain level:  9/10 Post-procedure: 0/10 (100% relief)  Sedation: None.  Effectiveness during initial hour after procedure(Ultra-Short Term Relief): 100 %   Local anesthetic used: Long-acting (4-6 hours) Effectiveness: Defined as any analgesic benefit obtained secondary to the administration of local anesthetics. This  carries significant diagnostic value as to the etiological location, or anatomical origin, of the pain. Duration of  benefit is expected to coincide with the duration of the local anesthetic used.  Effectiveness during initial 4-6 hours after procedure(Short-Term Relief): 100 %   Long-term benefit: Defined as any relief past the pharmacologic duration of the local anesthetics.  Effectiveness past the initial 6 hours after procedure(Long-Term Relief): 100 % x 3 weeks  Current benefits: Defined as benefit that persist at this time.   Analgesia:  75% Function: Ms. Hemler reports improvement in function ROM: Ms. Heaps reports improvement in ROM  Pharmacotherapy Assessment  Analgesic: Oxycodone IR 5 mg every 8 hours (15 mg/day of oxycodone) MME/day: 22.5 mg/day.   Monitoring: Pharmacotherapy: No side-effects or adverse reactions reported. [place] PMP: PDMP reviewed during this encounter.       Compliance: No problems identified. Effectiveness: Clinically acceptable. Plan: Refer to "POC".  UDS: No results found for: SUMMARY Laboratory Chemistry Profile (12 mo)  Renal: 04/27/2018: BUN 21; Creatinine, Ser 0.92; GFR calc Af Amer >60; GFR calc non Af Amer >60  Hepatic: 04/27/2018: Albumin 4.0; ALT 14; AST 12 Other: 04/27/2018: 25-Hydroxy, Vitamin D 36; 25-Hydroxy, Vitamin D-2 <1.0; 25-Hydroxy, Vitamin D-3 36; CRP <0.8; Sed Rate 19; Vitamin B-12 371 Note: Above Lab results reviewed.  Imaging  Last 90 days:  Dg Shoulder Left  Result Date: 06/01/2018 CLINICAL DATA:  Left shoulder pain EXAM: LEFT SHOULDER - 2+ VIEW COMPARISON:  08/04/2017 FINDINGS: Degenerative changes in the left [facility] and glenohumeral joints with joint space narrowing and spurring. No acute bony abnormality. Specifically, no fracture, subluxation, or dislocation. 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 No acute bony abnormality. Electronically Signed   By: Rolm Baptise M.D.   On: 06/01/2018 12:48   Dg Pain Clinic  C-arm 1-60 Min No Report  Result Date: 07/17/2018 Fluoro was used, but no Radiologist interpretation will be provided. Please refer to "NOTES" tab for provider progress note.  Dg Pain Clinic C-arm 1-60 Min No Report  Result Date: 07/02/2018 Fluoro was used, but no Radiologist interpretation will be provided. Please refer to "NOTES" tab for provider progress note.  Last Hospital Admission:  Dg Pain Clinic C-arm 1-60 Min No Report  Result Date: 07/17/2018 Fluoro was used, but no Radiologist interpretation will be provided. Please refer to "NOTES" tab for provider progress note.  Assessment  Diagnoses of Chronic pain syndrome and Neurogenic pain were pertinent to this visit.  Plan of Care  I am having Jaclin M. Finnie maintain her docusate sodium, glucose blood, ibuprofen, lovastatin, QUEtiapine, metFORMIN, Jardiance, DULoxetine, cyclobenzaprine, acyclovir, potassium chloride, chlorthalidone, oxyCODONE, and gabapentin.  Pharmacotherapy (Medications Ordered): Meds ordered this encounter  Medications  . oxyCODONE (OXY IR/ROXICODONE) 5 MG immediate release tablet    Sig: Take 1 tablet (5 mg total) by mouth every 8 (eight) hours as needed for severe pain. Must last 30 days.    Dispense:  90 tablet    Refill:  0    Chronic Pain: STOP Act - Not applicable. Fill 1 day early if closed on scheduled refill date. Do not fill until: 09/07/2018. To last until: 10/07/2018. Instruct to avoid benzodiazepines within 8 hours of opioid.  Marland Kitchen gabapentin (NEURONTIN) 100 MG capsule    Sig: Take 3 capsules (300 mg total) by mouth at bedtime AND 1 capsule (100 mg total) 3 (three) times daily.    Dispense:  180 capsule    Refill:  0    Fill one day early if pharmacy is closed on scheduled refill date. May substitute for generic if available.   Orders:  No orders of the defined types were placed in this encounter.  Follow-up plan:   Return in about 8 weeks (around 10/03/2018) for (VV), E/M (MM).       Interventional management considerations: Considering:   NOTE: Currently awaiting to have a right total hip replacement by Dr. Mardelle Matte. Diagnostic bilateral suprascapular nerve block #2  Possible bilateral suprascapular nerve RFA  Diagnostic right-sided cervical epidural steroid injection  Diagnostic bilateral cervical facet block  Possible bilateral cervical facet RFA  Diagnostic bilateral lumbar facet blocks  Possible bilateral lumbar facet RFA  Diagnostic right-sided lumbar epidural steroid injection  Diagnostic right-sided versus bilateral transforaminal epidural steroid injections  Diagnostic bilateral sacroiliac joint block  Possible bilateral sacroiliac joint RFA  Diagnostic right-sided genicular nerve block  Possible right-sided genicular nerve RFA    PRN Procedures:   Palliative bilateral intra-articular hip joint injection #2  Palliative bilateral intra-articular shoulder joint injection #2      Recent Visits Date Type Provider Dept  07/17/18 Procedure visit Milinda Pointer, MD [facility]  07/04/18 Office Visit Milinda Pointer, MD [facility]  06/14/18 Procedure visit Milinda Pointer, MD [facility]  06/04/18 Office Visit Milinda Pointer, MD [facility]  05/10/18 Office Visit Milinda Pointer, MD [facility]  Showing recent visits within past 90 days and meeting all other requirements   Today's Visits Date Type Provider Dept  08/07/18 Office Visit Milinda Pointer, MD [facility]  Showing today's visits and meeting all other requirements   Future Appointments No visits were found meeting these conditions.  Showing future appointments within next 90 days and meeting all other requirements   I discussed the assessment and treatment plan with the patient. The patient was provided an opportunity to ask questions and all were answered. The patient agreed with the plan and demonstrated an  understanding of the instructions.  Patient advised to call back or seek an in-person evaluation if the symptoms or condition worsens.  Total duration of non-face-to-face encounter: 15 minutes.  Note by: Gaspar Cola, MD Date: 08/07/2018; Time: 3:51 PM  Note: This dictation was prepared with Dragon dictation. Any transcriptional errors that may result from this process are unintentional.  Disclaimer:  * Given the special circumstances of the COVID-19 pandemic, the federal government has announced that the Office for Civil Rights (OCR) will exercise its enforcement discretion and will not impose penalties on physicians using telehealth in the event of noncompliance with regulatory requirements under the [place] (HIPAA) in connection with the good faith provision of telehealth during the MOQHU-76 national public health emergency. ([place])

## 2018-09-04 ENCOUNTER — Telehealth: Payer: Self-pay | Admitting: Pain Medicine

## 2018-09-04 NOTE — Telephone Encounter (Signed)
Patient called stating her hip is killing her and she wants to get injection in her hip as well on Thursday. Please let patient know if this is possible, she is having [place].

## 2018-09-04 NOTE — Telephone Encounter (Signed)
Spoke with patient and she states she would like the hip and the suprascapular done on Thursday.  Checked with insurance and [employer] states that it would be ok to have this done.  Message sent to Dr Dossie Arbour informing him of patient request.

## 2018-09-06 ENCOUNTER — Ambulatory Visit
Admission: RE | Admit: 2018-09-06 | Discharge: 2018-09-06 | Disposition: A | Discharge status: Home / Self Care | Payer: Medicare Other | Source: Ambulatory Visit | Attending: Pain Medicine | Admitting: Pain Medicine

## 2018-09-06 ENCOUNTER — Encounter: Payer: Self-pay | Admitting: Pain Medicine

## 2018-09-06 ENCOUNTER — Ambulatory Visit (HOSPITAL_BASED_OUTPATIENT_CLINIC_OR_DEPARTMENT_OTHER): Payer: Medicare Other | Admitting: Pain Medicine

## 2018-09-06 ENCOUNTER — Other Ambulatory Visit: Payer: Self-pay

## 2018-09-06 VITALS — BP 133/94 | HR 78 | Temp 98.4°F | Resp 13 | Ht 68.0 in | Wt 173.0 lb

## 2018-09-06 DIAGNOSIS — M1611 Unilateral primary osteoarthritis, right hip: Secondary | ICD-10-CM | POA: Insufficient documentation

## 2018-09-06 DIAGNOSIS — C50919 Malignant neoplasm of unspecified site of unspecified female breast: Secondary | ICD-10-CM | POA: Insufficient documentation

## 2018-09-06 DIAGNOSIS — M25512 Pain in left shoulder: Secondary | ICD-10-CM | POA: Insufficient documentation

## 2018-09-06 DIAGNOSIS — G8929 Other chronic pain: Secondary | ICD-10-CM | POA: Diagnosis not present

## 2018-09-06 DIAGNOSIS — M19011 Primary osteoarthritis, right shoulder: Secondary | ICD-10-CM

## 2018-09-06 DIAGNOSIS — IMO0002 Reserved for concepts with insufficient information to code with codable children: Secondary | ICD-10-CM | POA: Insufficient documentation

## 2018-09-06 DIAGNOSIS — M25551 Pain in right hip: Secondary | ICD-10-CM | POA: Diagnosis present

## 2018-09-06 DIAGNOSIS — M19012 Primary osteoarthritis, left shoulder: Secondary | ICD-10-CM | POA: Insufficient documentation

## 2018-09-06 DIAGNOSIS — M25511 Pain in right shoulder: Secondary | ICD-10-CM | POA: Insufficient documentation

## 2018-09-06 MED ORDER — FENTANYL CITRATE (PF) 100 MCG/2ML IJ SOLN: 25.0000 ug | INTRAMUSCULAR | Status: DC | PRN

## 2018-09-06 MED ORDER — METHYLPREDNISOLONE ACETATE 80 MG/ML IJ SUSP
80.0000 mg | Freq: Once | INTRAMUSCULAR | Status: AC
  Administered 2018-09-06: 80 mg via INTRA_ARTICULAR
  Filled 2018-09-06: qty 1

## 2018-09-06 MED ORDER — MIDAZOLAM HCL 5 MG/5ML IJ SOLN: 1.0000 mg | INTRAMUSCULAR | Status: DC | PRN

## 2018-09-06 MED ORDER — IOHEXOL 180 MG/ML  SOLN: 10.0000 mL | Freq: Once | INTRAMUSCULAR | Status: DC

## 2018-09-06 MED ORDER — ROPIVACAINE HCL 2 MG/ML IJ SOLN
9.0000 mL | Freq: Once | INTRAMUSCULAR | Status: DC
  Filled 2018-09-06: qty 10

## 2018-09-06 MED ORDER — LACTATED RINGERS IV SOLN: 1000.0000 mL | Freq: Once | INTRAVENOUS | Status: DC

## 2018-09-06 MED ORDER — METHYLPREDNISOLONE ACETATE 80 MG/ML IJ SUSP
80.0000 mg | Freq: Once | INTRAMUSCULAR | Status: AC
  Administered 2018-09-06: 80 mg
  Filled 2018-09-06: qty 1

## 2018-09-06 MED ORDER — LIDOCAINE HCL 2 % IJ SOLN
20.0000 mL | Freq: Once | INTRAMUSCULAR | Status: AC
  Administered 2018-09-06: 400 mg
  Filled 2018-09-06: qty 20

## 2018-09-06 MED ORDER — ROPIVACAINE HCL 2 MG/ML IJ SOLN
9.0000 mL | Freq: Once | INTRAMUSCULAR | Status: AC
  Administered 2018-09-06: 10:00:00 9 mL via INTRA_ARTICULAR
  Filled 2018-09-06: qty 10

## 2018-09-06 MED ORDER — ROPIVACAINE HCL 2 MG/ML IJ SOLN
INTRAMUSCULAR | Status: AC
  Filled 2018-09-06: qty 10

## 2018-09-06 NOTE — Progress Notes (Signed)
Patient's Name: Ashlee Bruce  MRN: [id number]  Referring Provider: Petra Kuba, MD  DOB: [birth year]  PCP: Petra Kuba, MD  DOS: 09/06/2018  Note by: Gaspar Cola, MD  Service setting: Ambulatory outpatient  Specialty: Interventional Pain Management  Patient type: Established  Location: [facility] (AMB) Pain Management Facility  Visit type: Interventional Procedure   Primary Reason for Visit: Interventional Pain Management Treatment. CC: Shoulder Pain (bilateral) and Hip Pain (right)  Procedure #1:  Anesthesia, Analgesia, Anxiolysis:  Type: Diagnostic Suprascapular nerve Block #2  Primary Purpose: Diagnostic Region: Posterior Shoulder & Scapular Areas Level: Superior to the scapular spine, in the lateral aspect of the supraspinatus fossa (Suprescapular notch). Target Area: Suprascapular nerve as it passes thru the lower portion of the suprascapular notch. Approach: Posterior percutaneous approach. Laterality: Bilateral  Type: Moderate (Conscious) Sedation combined with Local Anesthesia Indication(s): Analgesia and Anxiety Route: Intravenous (IV) IV Access: Secured Sedation: Meaningful verbal contact was maintained at all times during the procedure  Local Anesthetic: Lidocaine 1-2%  Position: Prone   Indications: 1. Chronic shoulder pain (Secondary Area of Pain) (Bilateral) (R>L)   2. Osteoarthritis of AC (acromioclavicular) joint (Left)   3. Osteoarthritis of glenohumeral joint (Left)   4. Osteoarthritis of glenohumeral joint (Right)   5. Osteoarthritis of shoulders (Bilateral)    Procedure #2:  Anesthesia, Analgesia, Anxiolysis:  Type: Intra-Articular Hip Injection          Primary Purpose: Diagnostic Region: Posterolateral hip joint area. Level: Lower pelvic and hip joint level. Target Area: Superior aspect of the hip joint cavity, going thru the superior portion of the capsular ligament. Approach: Posterolateral approach. Laterality: Right  Type: Moderate  (Conscious) Sedation combined with Local Anesthesia Indication(s): Analgesia and Anxiety Route: Intravenous (IV) IV Access: Secured Sedation: Meaningful verbal contact was maintained at all times during the procedure  Local Anesthetic: Lidocaine 1-2%  Position: Lateral Decubitus with bad side up Prepped Area: Entire Posterolateral hip area. Prepping solution: DuraPrep (Iodine Povacrylex [0.7% available iodine] and Isopropyl Alcohol, 74% w/w)   Indications: 1. Chronic hip pain (Primary area of Pain) (Right)   2. Osteoarthritis of hip (Right)    Pain Score: Pre-procedure: 9 /10 Post-procedure: 0-No pain/10   Pre-op Assessment:  Ashlee Bruce is a 63 y.o. (year old), female patient, seen today for interventional treatment. She  has a past surgical history that includes Cholecystectomy; Breast surgery; Knee Arthroplasty; Thyroid surgery; Ectopic pregnancy surgery; Wrist surgery; Anterior cervical decomp/discectomy fusion (N/A, 07/26/2012); Joint replacement (Right, 2007); and Knee arthroscopy. Ashlee Bruce has a current medication list which includes the following prescription(s): chlorthalidone, cyclobenzaprine, docusate sodium, duloxetine, gabapentin, glucose blood, ibuprofen, jardiance, lovastatin, metformin, oxycodone, potassium chloride, quetiapine, and acyclovir, and the following Facility-Administered Medications: fentanyl, iohexol, lactated ringers, midazolam, and ropivacaine (pf) 2 mg/ml (0.2%). Her primarily concern today is the Shoulder Pain (bilateral) and Hip Pain (right)  Initial Vital Signs:  Pulse/HCG Rate: 86  Temp: 98.4 F (36.9 C) Resp: 18 BP: (!) 156/96 SpO2: 99 %  BMI: Estimated body mass index is 26.3 kg/m as calculated from the following:   Height as of this encounter: 5\' 8"  (1.727 m).   Weight as of this encounter: 173 lb (78.5 kg).  Risk Assessment: Allergies: Reviewed. She is allergic to raloxifene.  Allergy Precautions: None required Coagulopathies: Reviewed. None  identified.  Blood-thinner therapy: None at this time Active Infection(s): Reviewed. None identified. Ashlee Bruce is afebrile  Site Confirmation: Ashlee Bruce was asked to confirm the procedure and laterality before marking  the site Procedure checklist: Completed Consent: Before the procedure and under the influence of no sedative(s), amnesic(s), or anxiolytics, the patient was informed of the treatment options, risks and possible complications. To fulfill our ethical and legal obligations, as recommended by the American Medical Association's Code of Ethics, I have informed the patient of my clinical impression; the nature and purpose of the treatment or procedure; the risks, benefits, and possible complications of the intervention; the alternatives, including doing nothing; the risk(s) and benefit(s) of the alternative treatment(s) or procedure(s); and the risk(s) and benefit(s) of doing nothing. The patient was provided information about the general risks and possible complications associated with the procedure. These may include, but are not limited to: failure to achieve desired goals, infection, bleeding, organ or nerve damage, allergic reactions, paralysis, and death. In addition, the patient was informed of those risks and complications associated to the procedure, such as failure to decrease pain; infection; bleeding; organ or nerve damage with subsequent damage to sensory, motor, and/or autonomic systems, resulting in permanent pain, numbness, and/or weakness of one or several areas of the body; allergic reactions; (i.e.: anaphylactic reaction); and/or death. Furthermore, the patient was informed of those risks and complications associated with the medications. These include, but are not limited to: allergic reactions (i.e.: anaphylactic or anaphylactoid reaction(s)); adrenal axis suppression; blood sugar elevation that in diabetics may result in ketoacidosis or comma; water retention that in patients  with history of congestive heart failure may result in shortness of breath, pulmonary edema, and decompensation with resultant heart failure; weight gain; swelling or edema; medication-induced neural toxicity; particulate matter embolism and blood vessel occlusion with resultant organ, and/or nervous system infarction; and/or aseptic necrosis of one or more joints. Finally, the patient was informed that Medicine is not an exact science; therefore, there is also the possibility of unforeseen or unpredictable risks and/or possible complications that may result in a catastrophic outcome. The patient indicated having understood very clearly. We have given the patient no guarantees and we have made no promises. Enough time was given to the patient to ask questions, all of which were answered to the patient's satisfaction. Ms. Mook has indicated that she wanted to continue with the procedure. Attestation: I, the ordering provider, attest that I have discussed with the patient the benefits, risks, side-effects, alternatives, likelihood of achieving goals, and potential problems during recovery for the procedure that I have provided informed consent. Date   Time: 09/06/2018  8:33 AM  Pre-Procedure Preparation:  Monitoring: As per clinic protocol. Respiration, ETCO2, SpO2, BP, heart rate and rhythm monitor placed and checked for adequate function Safety Precautions: Patient was assessed for positional comfort and pressure points before starting the procedure. Time-out: I initiated and conducted the "Time-out" before starting the procedure, as per protocol. The patient was asked to participate by confirming the accuracy of the "Time Out" information. Verification of the correct person, site, and procedure were performed and confirmed by me, the nursing staff, and the patient. "Time-out" conducted as per Joint Commission's Universal Protocol (UP.01.01.01). Time: 0929  Description of Procedure #1:  Area Prepped:  Entire shoulder Area Prepping solution: DuraPrep (Iodine Povacrylex [0.7% available iodine] and Isopropyl Alcohol, 74% w/w) Safety Precautions: Aspiration looking for blood return was conducted prior to all injections. At no point did we inject any substances, as a needle was being advanced. No attempts were made at seeking any paresthesias. Safe injection practices and needle disposal techniques used. Medications properly checked for expiration dates. SDV (single dose vial)  medications used. Description of the Procedure: Protocol guidelines were followed. The patient was placed in position over the procedure table. The target area was identified and the area prepped in the usual manner. Skin & deeper tissues infiltrated with local anesthetic. Appropriate amount of time allowed to pass for local anesthetics to take effect. The procedure needles were then advanced to the target area. Proper needle placement secured. Negative aspiration confirmed. Solution injected in intermittent fashion, asking for systemic symptoms every 0.5cc of injectate. The needles were then removed and the area cleansed, making sure to leave some of the prepping solution back to take advantage of its long term bactericidal properties.  Vitals:   09/06/18 0832 09/06/18 0920 09/06/18 0930 09/06/18 0940  BP:  (!) 156/96 (!) 140/93 (!) 133/94  Pulse: 86 77 78 78  Resp: 18 15 18 13   Temp: 98.4 F (36.9 C)     TempSrc: Oral     SpO2: 99% 99% 97% 98%  Weight: 173 lb (78.5 kg)     Height: 5\' 8"  (1.727 m)       Start Time: 0929 hrs. End Time: 0940 hrs. Materials:  Needle(s) Type: Spinal Needle Gauge: 22G Length: 3.5-in Medication(s): Please see orders for medications and dosing details.  Imaging Guidance (Non-Spinal) for procedure #1:  Type of Imaging Technique: Fluoroscopy Guidance (Non-Spinal) Indication(s): Assistance in needle guidance and placement for procedures requiring needle placement in or near specific anatomical  locations not easily accessible without such assistance. Exposure Time: Please see nurses notes. Contrast: Before injecting any contrast, we confirmed that the patient did not have an allergy to iodine, shellfish, or radiological contrast. Once satisfactory needle placement was completed at the desired level, radiological contrast was injected. Contrast injected under live fluoroscopy. No contrast complications. See chart for type and volume of contrast used. Fluoroscopic Guidance: I was personally present during the use of fluoroscopy. "Tunnel Vision Technique" used to obtain the best possible view of the target area. Parallax error corrected before commencing the procedure. "Direction-depth-direction" technique used to introduce the needle under continuous pulsed fluoroscopy. Once target was reached, antero-posterior, oblique, and lateral fluoroscopic projection used confirm needle placement in all planes. Images permanently stored in EMR. Interpretation: I personally interpreted the imaging intraoperatively. Adequate needle placement confirmed in multiple planes. Appropriate spread of contrast into desired area was observed. No evidence of afferent or efferent intravascular uptake. Permanent images saved into the patient's record.  Description of Procedure #2:  Safety Precautions: Aspiration looking for blood return was conducted prior to all injections. At no point did we inject any substances, as a needle was being advanced. No attempts were made at seeking any paresthesias. Safe injection practices and needle disposal techniques used. Medications properly checked for expiration dates. SDV (single dose vial) medications used. Description of the Procedure: Protocol guidelines were followed. The patient was placed in position over the fluoroscopy table. The target area was identified and the area prepped in the usual manner. Skin & deeper tissues infiltrated with local anesthetic. Appropriate amount of  time allowed to pass for local anesthetics to take effect. The procedure needles were then advanced to the target area. Proper needle placement secured. Negative aspiration confirmed. Solution injected in intermittent fashion, asking for systemic symptoms every 0.5cc of injectate. The needles were then removed and the area cleansed, making sure to leave some of the prepping solution back to take advantage of its long term bactericidal properties. Vitals:   09/06/18 0832 09/06/18 0920 09/06/18 0930 09/06/18 0940  BP:  Marland Kitchen)  156/96 (!) 140/93 (!) 133/94  Pulse: 86 77 78 78  Resp: 18 15 18 13   Temp: 98.4 F (36.9 C)     TempSrc: Oral     SpO2: 99% 99% 97% 98%  Weight: 173 lb (78.5 kg)     Height: 5\' 8"  (1.727 m)       Start Time: 0929 hrs. End Time: 0940 hrs. Materials:  Needle(s) Type: Spinal Needle Gauge: 22G Length: 5.0-in Medication(s): Please see orders for medications and dosing details.  Imaging Guidance (Non-Spinal) for procedure #2:  Type of Imaging Technique: Fluoroscopy Guidance (Non-Spinal) Indication(s): Assistance in needle guidance and placement for procedures requiring needle placement in or near specific anatomical locations not easily accessible without such assistance. Exposure Time: Please see nurses notes. Contrast: Before injecting any contrast, we confirmed that the patient did not have an allergy to iodine, shellfish, or radiological contrast. Once satisfactory needle placement was completed at the desired level, radiological contrast was injected. Contrast injected under live fluoroscopy. No contrast complications. See chart for type and volume of contrast used. Fluoroscopic Guidance: I was personally present during the use of fluoroscopy. "Tunnel Vision Technique" used to obtain the best possible view of the target area. Parallax error corrected before commencing the procedure. "Direction-depth-direction" technique used to introduce the needle under continuous pulsed  fluoroscopy. Once target was reached, antero-posterior, oblique, and lateral fluoroscopic projection used confirm needle placement in all planes. Images permanently stored in EMR. Interpretation: I personally interpreted the imaging intraoperatively. Adequate needle placement confirmed in multiple planes. Appropriate spread of contrast into desired area was observed. No evidence of afferent or efferent intravascular uptake. Permanent images saved into the patient's record.  Antibiotic Prophylaxis:   Anti-infectives (From admission, onward)   None     Indication(s): None identified  Post-operative Assessment:  Post-procedure Vital Signs:  Pulse/HCG Rate: 78  Temp: 98.4 F (36.9 C) Resp: 13 BP: (!) 133/94 SpO2: 98 %  EBL: None  Complications: No immediate post-treatment complications observed by team, or reported by patient.  Note: The patient tolerated the entire procedure well. A repeat set of vitals were taken after the procedure and the patient was kept under observation following institutional policy, for this type of procedure. Post-procedural neurological assessment was performed, showing return to baseline, prior to discharge. The patient was provided with post-procedure discharge instructions, including a section on how to identify potential problems. Should any problems arise concerning this procedure, the patient was given instructions to immediately contact us, at any time, without hesitation. In any case, we plan to contact the patient by telephone for a follow-up status report regarding this interventional procedure.  Comments:  No additional relevant information.  Plan of Care  Orders:  Orders Placed This Encounter  Procedures   SUPRASCAPULAR NERVE BLOCK    For shoulder pain.    Scheduling Instructions:     Purpose: Diagnostic     Laterality: Bilateral     Level(s): Suprascapular notch     Sedation: Patient's choice.     Scheduling Timeframe: Today    Order  Specific Question:   Where will this procedure be performed?    Answer:   [facility]   HIP INJECTION    Scheduling Instructions:     Side: Right-sided     Sedation: With Sedation.     Timeframe: Today   DG PAIN [facility] C-ARM 1-60 MIN NO REPORT    Intraoperative interpretation by procedural physician at [place].    Standing Status:  Standing    Number of Occurrences:   1    Order Specific Question:   Reason for exam:    Answer:   Assistance in needle guidance and placement for procedures requiring needle placement in or near specific anatomical locations not easily accessible without such assistance.   Provider attestation of informed consent for procedure/surgical case    I, the ordering provider, attest that I have discussed with the patient the benefits, risks, side effects, alternatives, likelihood of achieving goals and potential problems during recovery for the procedure that I have provided informed consent.    Standing Status:   Standing    Number of Occurrences:   1   Informed Consent Details: Transcribe to consent form and obtain patient signature    Standing Status:   Standing    Number of Occurrences:   1    Order Specific Question:   Procedure    Answer:   Bilateral suprascapular nerve block under fluoroscopic guidance.    Order Specific Question:   Surgeon    Answer:   Kathlen Brunswick. 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    Standing Status:   Standing    Number of Occurrences:   1     Order Specific Question:   Procedure    Answer:   Right-sided intra-articular hip joint injection under fluoroscopic guidance    Order Specific Question:   Surgeon    Answer:   Anayia Eugene A. Dossie Arbour, MD    Order Specific Question:   Indication/Reason    Answer:   Right hip pain secondary to osteoarthritis   Chronic Opioid Analgesic:  Oxycodone IR 5 mg every 8 hours (15 mg/day of oxycodone) MME/day: 22.5 mg/day.   Medications ordered for procedure: Meds ordered this encounter  Medications   lidocaine (XYLOCAINE) 2 % (with pres) injection 400 mg   lactated ringers infusion 1,000 mL   midazolam (VERSED) 5 MG/5ML injection 1-2 mg    Make sure Flumazenil is available in the pyxis when using this medication. If oversedation occurs, administer 0.2 mg IV over 15 sec. If after 45 sec no response, administer 0.2 mg again over 1 min; may repeat at 1 min intervals; not to exceed 4 doses (1 mg)   fentaNYL (SUBLIMAZE) injection 25-50 mcg    Make sure Narcan is available in the pyxis when using this medication. In the event of respiratory depression (RR< 8/min): Titrate NARCAN (naloxone) in increments of 0.1 to 0.2 mg IV at 2-3 minute intervals, until desired degree of reversal.   ropivacaine (PF) 2 mg/mL (0.2%) (NAROPIN) injection 9 mL   methylPREDNISolone acetate (DEPO-MEDROL) injection 80 mg   iohexol (OMNIPAQUE) 180 MG/ML injection 10 mL    Must be Myelogram-compatible. If not available, you may substitute with a water-soluble, non-ionic, hypoallergenic, myelogram-compatible radiological contrast medium.   ropivacaine (PF) 2 mg/mL (0.2%) (NAROPIN) injection 9 mL   methylPREDNISolone acetate (DEPO-MEDROL) injection 80 mg   Medications administered: We administered lidocaine, methylPREDNISolone acetate, ropivacaine (PF) 2 mg/mL (0.2%), and methylPREDNISolone acetate.  See the medical record for exact dosing, route, and time  of administration.  Follow-up plan:   Return in about 2 weeks  (around 09/20/2018) for (VV), E/M, (PP).       Interventional management considerations: Considering:   NOTE: Currently awaiting to have a right total hip replacement by Dr. Mardelle Matte. Diagnostic bilateral suprascapular nerve block #2  Possible bilateral suprascapular nerve RFA  Diagnostic right-sided cervical epidural steroid injection  Diagnostic bilateral cervical facet block  Possible bilateral cervical facet RFA  Diagnostic bilateral lumbar facet blocks  Possible bilateral lumbar facet RFA  Diagnostic right-sided lumbar epidural steroid injection  Diagnostic right-sided versus bilateral transforaminal epidural steroid injections  Diagnostic bilateral sacroiliac joint block  Possible bilateral sacroiliac joint RFA  Diagnostic right-sided genicular nerve block  Possible right-sided genicular nerve RFA    PRN Procedures:   Palliative bilateral intra-articular hip joint injection #2  Palliative bilateral intra-articular shoulder joint injection #2     Recent Visits Date Type Provider Dept  08/07/18 Office Visit Milinda Pointer, MD [facility]  07/17/18 Procedure visit Milinda Pointer, MD [facility]  07/04/18 Office Visit Milinda Pointer, MD [facility]  06/14/18 Procedure visit Milinda Pointer, MD [facility]  Showing recent visits within past 90 days and meeting all other requirements   Today's Visits Date Type Provider Dept  09/06/18 Procedure visit Milinda Pointer, MD [facility]  Showing today's visits and meeting all other requirements   Future Appointments Date Type Provider Dept  09/26/18 Appointment Milinda Pointer, MD [facility]  Showing future appointments within next 90 days and meeting all other requirements   Disposition: Discharge home  Discharge Date & Time: 09/06/2018; 0945 hrs.   Primary Care Physician: Petra Kuba, MD Location: [facility]  Facility Note by: Gaspar Cola, MD Date: 09/06/2018; Time: 10:16 AM  Disclaimer:  Medicine is not an Chief Strategy Officer. The only guarantee in medicine is that nothing is guaranteed. It is important to note that the decision to proceed with this intervention was based on the information collected from the patient. The Data and conclusions were drawn from the patient's questionnaire, the interview, and the physical examination. Because the information was provided in large part by the patient, it cannot be guaranteed that it has not been purposely or unconsciously manipulated. Every effort has been made to obtain as much relevant data as possible for this evaluation. It is important to note that the conclusions that lead to this procedure are derived in large part from the available data. Always take into account that the treatment will also be dependent on availability of resources and existing treatment guidelines, considered by other Pain Management Practitioners as being common knowledge and practice, at the time of the intervention. For Medico-Legal purposes, it is also important to point out that variation in procedural techniques and pharmacological choices are the acceptable norm. The indications, contraindications, technique, and results of the above procedure should only be interpreted and judged by a Board-Certified Interventional Pain Specialist with extensive familiarity and expertise in the same exact procedure and technique.

## 2018-09-06 NOTE — Patient Instructions (Signed)

## 2018-09-06 NOTE — Progress Notes (Signed)
Safety precautions to be maintained throughout the outpatient stay will include: orient to surroundings, keep bed in low position, maintain call bell within reach at all times, provide assistance with transfer out of bed and ambulation.  

## 2018-09-07 ENCOUNTER — Telehealth: Payer: Self-pay

## 2018-09-07 NOTE — Telephone Encounter (Signed)
Post procedure phone call.  Patient states she is doing great 

## 2018-09-24 ENCOUNTER — Ambulatory Visit: Payer: Medicare Other | Admitting: Pain Medicine

## 2018-09-25 NOTE — Progress Notes (Signed)
Pain Management Virtual Encounter Note - Virtual Visit via Telephone Telehealth (real-time audio visits between healthcare provider and patient).   Patient's Phone No. & Preferred Pharmacy:  [phone] (home); [phone] (mobile); (Preferred) [phone] [email or web address]  [place], [place] [address] [place] [address] [facility] [place] [address] Phone: [phone] Fax: [phone]    Pre-screening note:  Our staff contacted Ashlee Bruce and offered her an "in person", "face-to-face" appointment versus a telephone encounter. She indicated preferring the telephone encounter, at this time.   Reason for Virtual Visit: COVID-19*  Social distancing based on CDC and AMA recommendations.   I contacted Ashlee Bruce on 09/26/2018 via telephone.      I clearly identified myself as Gaspar Cola, MD. I verified that I was speaking with the correct person using two identifiers (Name: Ashlee Bruce, and date of birth: 02-Apr-1955).  Advanced Informed Consent I sought verbal advanced consent from Ashlee Bruce for virtual visit interactions. I informed Ashlee Bruce of possible security and privacy concerns, risks, and limitations associated with providing "not-in-person" medical evaluation and management services. I also informed Ashlee Bruce of the availability of "in-person" appointments. Finally, I informed her that there would be a charge for the virtual visit and that she could be  personally, fully or partially, financially responsible for it. Ashlee Bruce expressed understanding and agreed to proceed.   Historic Elements   Ashlee Bruce is a 63 y.o. year old, female patient evaluated today after her last encounter by our practice on 09/07/2018. Ashlee Bruce  has a past medical history of Bipolar disorder ([place]), Depression, Diabetes mellitus without complication ([place]), GERD (gastroesophageal reflux disease), Hypertension, Shortness of  breath, and Transfusion history. She also  has a past surgical history that includes Cholecystectomy; Breast surgery; Knee Arthroplasty; Thyroid surgery; Ectopic pregnancy surgery; Wrist surgery; Anterior cervical decomp/discectomy fusion (N/A, 07/26/2012); Joint replacement (Right, 2007); and Knee arthroscopy. Ashlee Bruce has a current medication list which includes the following prescription(s): acyclovir, chlorthalidone, cyclobenzaprine, docusate sodium, duloxetine, glucose blood, ibuprofen, jardiance, lovastatin, metformin, potassium chloride, quetiapine, gabapentin, and oxycodone. She  reports that she has never smoked. She has never used smokeless tobacco. She reports current alcohol use of about 1.0 standard drinks of alcohol per week. She reports that she does not use drugs. Ashlee Bruce is allergic to raloxifene.   HPI  Today, she is being contacted for both, medication management and a post-procedure assessment.  The patient indicates doing well with the current medication regimen. No adverse reactions or side effects reported to the medications.  Based on the results of this recent procedure, I will refer the patient bilateral suprascapular nerve radiofrequency ablation under fluoroscopic guidance and IV sedation.  Today I took the time to explain to the patient the procedure and what it involves.  I also took the time to explain to her the risk and possible complications.  In addition to this, today we talked about gabapentin and how we can increase it to help the burning sensation that she will intermittently feel in her right groin area as a consequence of her hip problems.  She has indicated that she tolerates the gabapentin well without any problems and therefore we will go ahead and start increasing it so asked to eventually get to 300 mg 4 times a day.  Once we do that, then I will go ahead and switch her to the 300 mg pills and continue titrating up as she  tolerates it.  Once again, today she has  requested an increase in the opioids but I have reminded her that she is "high risk" and that I will not be increasing her opioids any further.  I have explained to her however will be using the gabapentin and the interventional therapies to control her pain so that she is not need to continue using the opioids.  Unfortunately, even though I have repeated this to her many times, every single visit she will request me to either switch her to Percocet, give her some breakthrough medicine, increase the amount of pills that she can take per day, or switch her to the oxycodone 10 mg pills.  The answer to all of these is NO.  I did take the time to talk to her again about what "drug holidays" and how she can use them if by any chance she begins to feel that she is getting tolerant to the opioids.  Today she has requested that we take over her Flexeril and I have agreed to do this.  Post-Procedure Evaluation  Procedure: Diagnostic bilateral suprascapular nerve block #2 + diagnostic right-sided intra-articular hip joint injection #2 under fluoroscopic guidance and IV sedation Pre-procedure pain level:  9/10 Post-procedure: 0/10 (100% relief)  Sedation: Sedation provided.  Effectiveness during initial hour after procedure(Ultra-Short Term Relief):   100% for both shoulders and the hip.  Local anesthetic used: Long-acting (4-6 hours) Effectiveness: Defined as any analgesic benefit obtained secondary to the administration of local anesthetics. This carries significant diagnostic value as to the etiological location, or anatomical origin, of the pain. Duration of benefit is expected to coincide with the duration of the local anesthetic used.  Effectiveness during initial 4-6 hours after procedure(Short-Term Relief):   100%  Long-term benefit: Defined as any relief past the pharmacologic duration of the local anesthetics.  Effectiveness past the initial 6 hours after procedure(Long-Term Relief):   100% x 2  days.  Current benefits: Defined as benefit that persist at this time.   Analgesia:  Somewhat improved.  Although the shoulder pain is back, she still having some benefit in terms of the right hip.  However she complains of some burning sensation that she experienced after the procedure.  She has noticed that this burning sensation in the groin area she does not have it all the time, but it will happen intermittently.  Today I have proposed that we increase the gabapentin to treat this part. Function: Somewhat improved ROM: Somewhat improved   Based on this results I have offered her the patient radiofrequency ablation of the suprascapular nerves.  Pharmacotherapy Assessment  Analgesic: Oxycodone IR 5 mg every 8 hours (15 mg/day of oxycodone) MME/day: 22.5 mg/day.   Monitoring: Pharmacotherapy: No side-effects or adverse reactions reported. [place] PMP: PDMP reviewed during this encounter.       Compliance: No problems identified. Effectiveness: Clinically acceptable. Plan: Refer to "POC".  UDS: No results found for: SUMMARY Laboratory Chemistry Profile (12 mo)  Renal: 04/27/2018: BUN 21; Creatinine, Ser 0.92  Lab Results  Component Value Date   GFRAA >60 04/27/2018   GFRNONAA >60 04/27/2018   Hepatic: 04/27/2018: Albumin 4.0 Lab Results  Component Value Date   AST 12 (L) 04/27/2018   ALT 14 04/27/2018   Other: 04/27/2018: 25-Hydroxy, Vitamin D 36; 25-Hydroxy, Vitamin D-2 <1.0; 25-Hydroxy, Vitamin D-3 36; CRP <0.8; Sed Rate 19; Vitamin B-12 371 Note: Above Lab results reviewed.  Imaging  Last 90 days:  Dg Pain Clinic C-arm 1-60 Min  No Report  Result Date: 09/06/2018 Fluoro was used, but no Radiologist interpretation will be provided. Please refer to "NOTES" tab for provider progress note.  Dg Pain Clinic C-arm 1-60 Min No Report  Result Date: 07/17/2018 Fluoro was used, but no Radiologist interpretation will be provided. Please refer to "NOTES" tab for provider progress  note.   Assessment  The primary encounter diagnosis was Chronic pain syndrome. Diagnoses of Chronic hip pain (Primary area of Pain) (Right), Chronic shoulder pain (Secondary Area of Pain) (Bilateral) (R>L), Chronic low back pain (Tertiary Area of Pain) (Bilateral) (R>L) w/o sciatica, Chronic knee pain (Fourth Area of Pain) (Right), Chronic neck pain (Fifth Area of Pain) (Bilateral) (R>L), Pharmacologic therapy, Disorder of skeletal system, Problems influencing health status, Neurogenic pain, and Chronic musculoskeletal pain were also pertinent to this visit.  Plan of Care  I have changed Ashlee Bruce's gabapentin and cyclobenzaprine. I am also having her maintain her docusate sodium, glucose blood, ibuprofen, lovastatin, QUEtiapine, metFORMIN, Jardiance, DULoxetine, acyclovir, potassium chloride, chlorthalidone, and oxyCODONE.  Pharmacotherapy (Medications Ordered): Meds ordered this encounter  Medications  . gabapentin (NEURONTIN) 100 MG capsule    Sig: Take 3 capsules (300 mg total) by mouth at bedtime AND 2 capsules (200 mg total) 3 (three) times daily.    Dispense:  270 capsule    Refill:  0    Fill one day early if pharmacy is closed on scheduled refill date. May substitute for generic if available.  Marland Kitchen oxyCODONE (OXY IR/ROXICODONE) 5 MG immediate release tablet    Sig: Take 1 tablet (5 mg total) by mouth every 8 (eight) hours as needed for severe pain. Must last 30 days.    Dispense:  90 tablet    Refill:  0    Chronic Pain: STOP Act - Not applicable. Fill 1 day early if closed on scheduled refill date. Do not fill until: 10/07/2018. To last until: 11/06/2018. Instruct to avoid benzodiazepines within 8 hours of opioid.  . cyclobenzaprine (FLEXERIL) 10 MG tablet    Sig: Take 1 tablet (10 mg total) by mouth 2 (two) times daily.    Dispense:  82 tablet    Refill:  0    Fill one day early if pharmacy is closed on scheduled refill date. May substitute for generic if available.    Orders:  Orders Placed This Encounter  Procedures  . Radiofrequency Shoulder Joint    For shoulder pain.    Standing Status:   Future    Standing Expiration Date:   03/25/2020    Scheduling Instructions:     Procedure: Suprascapular nerve RFA     Laterality: Bilateral     Level(s): Suprascapular nerve     Sedation: Patient's choice     Scheduling Timeframe: As soon as pre-approved    Order Specific Question:   Where will this procedure be performed?    Answer:   [facility]  . ToxASSURE Select 13 (MW), Urine    Volume: 30 ml(s). Minimum 3 ml of urine is needed. Document temperature of fresh sample. Indications: Long term (current) use of opiate analgesic [id number])   Follow-up plan:   Return for RFA (w/ sedation): (B) suprascapular nerve RFA #1.      Interventional management considerations: Considering:   NOTE: Currently awaiting to have a right total hip replacement by Dr. Mardelle Matte. Possible bilateral suprascapular nerve RFA  Diagnostic right CESI  Diagnostic bilateral cervical facet block  Possible bilateral cervical facet RFA  Diagnostic bilateral lumbar facet  blocks  Possible bilateral lumbar facet RFA  Diagnostic right LESI  Diagnostic right-sided versus bilateral TFESI  Diagnostic bilateral SI joint block  Possible bilateral SI joint RFA  Diagnostic right-sided genicular NB  Possible right-sided genicular nerve RFA    PRN Procedures:   Palliative right IA hip joint injection #3  Diagnostic bilateral suprascapular nerve block #3  Palliative bilateral IA shoulder joint injection #2     Recent Visits Date Type Provider Dept  09/06/18 Procedure visit Milinda Pointer, MD [facility]  08/07/18 Office Visit Milinda Pointer, MD [facility]  07/17/18 Procedure visit Milinda Pointer, MD [facility]  07/04/18 Office Visit Milinda Pointer, MD [facility]  Showing recent visits within past 90 days and meeting all  other requirements   Today's Visits Date Type Provider Dept  09/26/18 Office Visit Milinda Pointer, MD [facility]  Showing today's visits and meeting all other requirements   Future Appointments No visits were found meeting these conditions.  Showing future appointments within next 90 days and meeting all other requirements   I discussed the assessment and treatment plan with the patient. The patient was provided an opportunity to ask questions and all were answered. The patient agreed with the plan and demonstrated an understanding of the instructions.  Patient advised to call back or seek an in-person evaluation if the symptoms or condition worsens.  Total duration of non-face-to-face encounter: 18 minutes.  Note by: Gaspar Cola, MD Date: 09/26/2018; Time: 10:17 AM  Note: This dictation was prepared with Dragon dictation. Any transcriptional errors that may result from this process are unintentional.  Disclaimer:  * Given the special circumstances of the COVID-19 pandemic, the federal government has announced that the Office for Civil Rights (OCR) will exercise its enforcement discretion and will not impose penalties on physicians using telehealth in the event of noncompliance with regulatory requirements under the [place] (HIPAA) in connection with the good faith provision of telehealth during the XX123456 national public health emergency. ([place])

## 2018-09-26 ENCOUNTER — Other Ambulatory Visit: Payer: Self-pay

## 2018-09-26 ENCOUNTER — Ambulatory Visit: Payer: Medicare Other | Attending: Pain Medicine | Admitting: Pain Medicine

## 2018-09-26 ENCOUNTER — Encounter: Payer: Self-pay | Admitting: Pain Medicine

## 2018-09-26 DIAGNOSIS — M545 Low back pain: Secondary | ICD-10-CM

## 2018-09-26 DIAGNOSIS — M899 Disorder of bone, unspecified: Secondary | ICD-10-CM

## 2018-09-26 DIAGNOSIS — M25511 Pain in right shoulder: Secondary | ICD-10-CM | POA: Diagnosis not present

## 2018-09-26 DIAGNOSIS — G894 Chronic pain syndrome: Secondary | ICD-10-CM | POA: Diagnosis not present

## 2018-09-26 DIAGNOSIS — M25561 Pain in right knee: Secondary | ICD-10-CM

## 2018-09-26 DIAGNOSIS — G8929 Other chronic pain: Secondary | ICD-10-CM

## 2018-09-26 DIAGNOSIS — M25551 Pain in right hip: Secondary | ICD-10-CM | POA: Diagnosis not present

## 2018-09-26 DIAGNOSIS — M542 Cervicalgia: Secondary | ICD-10-CM

## 2018-09-26 DIAGNOSIS — Z789 Other specified health status: Secondary | ICD-10-CM

## 2018-09-26 DIAGNOSIS — M25512 Pain in left shoulder: Secondary | ICD-10-CM

## 2018-09-26 DIAGNOSIS — Z79899 Other long term (current) drug therapy: Secondary | ICD-10-CM

## 2018-09-26 DIAGNOSIS — M792 Neuralgia and neuritis, unspecified: Secondary | ICD-10-CM

## 2018-09-26 DIAGNOSIS — M7918 Myalgia, other site: Secondary | ICD-10-CM

## 2018-09-26 MED ORDER — GABAPENTIN 100 MG PO CAPS: ORAL_CAPSULE | ORAL | 0 refills | Status: DC

## 2018-09-26 MED ORDER — OXYCODONE HCL 5 MG PO TABS: 5.0000 mg | ORAL_TABLET | Freq: Three times a day (TID) | ORAL | 0 refills | Status: DC | PRN

## 2018-09-26 MED ORDER — CYCLOBENZAPRINE HCL 10 MG PO TABS: 10.0000 mg | ORAL_TABLET | Freq: Two times a day (BID) | ORAL | 0 refills | Status: DC

## 2018-09-26 NOTE — Patient Instructions (Signed)

## 2018-10-09 ENCOUNTER — Telehealth: Payer: Self-pay | Admitting: *Deleted

## 2018-10-09 NOTE — Telephone Encounter (Signed)
Patient was called back. She stated that her shoulders was hurting real bad and she was going to the ER to get a shot. She has a RFA schedule on 10/18/18 and she not sure if she going to keep it. Informed patient to let us know.

## 2018-10-17 NOTE — Progress Notes (Signed)
Patient's Name: Ashlee Bruce  MRN: [id number]  Referring Provider: Petra Kuba, MD  DOB: 31-Jan-1955  PCP: Petra Kuba, MD  DOS: 10/18/2018  Note by: Gaspar Cola, MD  Service setting: Ambulatory outpatient  Specialty: Interventional Pain Management  Patient type: Established  Location: [facility] (AMB) Pain Management Facility  Visit type: Interventional Procedure   Primary Reason for Visit: Interventional Pain Management Treatment. CC: Shoulder Pain (bilateral )  Procedure:          Anesthesia, Analgesia, Anxiolysis:  Type: Suprascapular nerve Radiofrequency Ablation #1  Primary Purpose: Therapeutic Region: Posterior Shoulder & Scapular Areas Level: Superior to the scapular spine, in the lateral aspect of the supraspinatus fossa (Suprescapular notch). Target Area: Suprascapular nerve as it passes thru the lower portion of the suprascapular notch. Approach: Posterior percutaneous approach. Laterality: Bilateral  Type: Local Anesthesia Indication(s): Analgesia         Route: Infiltration ([place]/IM) IV Access: Declined Sedation: Declined  Local Anesthetic: Lidocaine 1-2%  Position: Prone   Indications: 1. Chronic shoulder pain (Secondary Area of Pain) (Bilateral) (R>L)   2. Osteoarthritis of shoulders (Bilateral)   3. Osteoarthritis of glenohumeral joint (Left)   4. Osteoarthritis of glenohumeral joint (Right)   5. Osteoarthritis of AC (acromioclavicular) joint (Left)    Ashlee Bruce has been dealing with the above chronic pain for longer than three months and has either failed to respond, was unable to tolerate, or simply did not get enough benefit from other more conservative therapies including, but not limited to: 1. Over-the-counter medications 2. Anti-inflammatory medications 3. Muscle relaxants 4. Membrane stabilizers 5. Opioids 6. Physical therapy and/or chiropractic manipulation 7. Modalities (Heat, ice, etc.) 8. Invasive techniques such as nerve  blocks. [name] has attained more than 50% relief of the pain from a series of diagnostic injections conducted in separate occasions.  Pain Score: Pre-procedure: 10-Worst pain ever/10 Post-procedure: 0-No pain/10  Pharmacotherapy Assessment  Analgesic: Oxycodone IR 5 mg every 8 hours (15 mg/day of oxycodone). High-Risk for SUD. Prior hx. of Cocaine dependence & Schizoaffective disorder. MME/day: 22.5 mg/day.   Monitoring: Pharmacotherapy: No side-effects or adverse reactions reported. [place] PMP: PDMP reviewed during this encounter.       Compliance: No problems identified. Effectiveness: Clinically acceptable. Plan: Refer to "POC".  UDS: No results found for: SUMMARY   Pre-op Assessment:  [name] is a 63 y.o. (year old), female patient, seen today for interventional treatment. She  has a past surgical history that includes Cholecystectomy; Breast surgery; Knee Arthroplasty; Thyroid surgery; Ectopic pregnancy surgery; Wrist surgery; Anterior cervical decomp/discectomy fusion (N/A, 07/26/2012); Joint replacement (Right, 2007); and Knee arthroscopy. Ashlee Bruce has a current medication list which includes the following prescription(s): acyclovir, chlorthalidone, cyclobenzaprine, docusate sodium, duloxetine, gabapentin, glucose blood, hydrocodone-acetaminophen, hydrocodone-acetaminophen, ibuprofen, jardiance, lovastatin, metformin, oxycodone, potassium chloride, and quetiapine. Her primarily concern today is the Shoulder Pain (bilateral )  Initial Vital Signs:  Pulse/HCG Rate: 97ECG Heart Rate: 97 Temp: 98.6 F (37 C) Resp: 16 BP: 118/85 SpO2: 98 %  BMI: Estimated body mass index is 26.15 kg/m as calculated from the following:   Height as of this encounter: 5\' 8"  (1.727 m).   Weight as of this encounter: 172 lb (78 kg).  Risk Assessment: Allergies: Reviewed. She is allergic to raloxifene.  Allergy Precautions: None required Coagulopathies: Reviewed. None identified.  Blood-thinner  therapy: None at this time Active Infection(s): Reviewed. None identified. Ms. Thumm is afebrile  Site Confirmation: Ashlee Bruce was asked to confirm the procedure  and laterality before marking the site Procedure checklist: Completed Consent: Before the procedure and under the influence of no sedative(s), amnesic(s), or anxiolytics, the patient was informed of the treatment options, risks and possible complications. To fulfill our ethical and legal obligations, as recommended by the American Medical Association's Code of Ethics, I have informed the patient of my clinical impression; the nature and purpose of the treatment or procedure; the risks, benefits, and possible complications of the intervention; the alternatives, including doing nothing; the risk(s) and benefit(s) of the alternative treatment(s) or procedure(s); and the risk(s) and benefit(s) of doing nothing. The patient was provided information about the general risks and possible complications associated with the procedure. These may include, but are not limited to: failure to achieve desired goals, infection, bleeding, organ or nerve damage, allergic reactions, paralysis, and death. In addition, the patient was informed of those risks and complications associated to the procedure, such as failure to decrease pain; infection; bleeding; organ or nerve damage with subsequent damage to sensory, motor, and/or autonomic systems, resulting in permanent pain, numbness, and/or weakness of one or several areas of the body; allergic reactions; (i.e.: anaphylactic reaction); and/or death. Furthermore, the patient was informed of those risks and complications associated with the medications. These include, but are not limited to: allergic reactions (i.e.: anaphylactic or anaphylactoid reaction(s)); adrenal axis suppression; blood sugar elevation that in diabetics may result in ketoacidosis or comma; water retention that in patients with history of congestive  heart failure may result in shortness of breath, pulmonary edema, and decompensation with resultant heart failure; weight gain; swelling or edema; medication-induced neural toxicity; particulate matter embolism and blood vessel occlusion with resultant organ, and/or nervous system infarction; and/or aseptic necrosis of one or more joints. Finally, the patient was informed that Medicine is not an exact science; therefore, there is also the possibility of unforeseen or unpredictable risks and/or possible complications that may result in a catastrophic outcome. The patient indicated having understood very clearly. We have given the patient no guarantees and we have made no promises. Enough time was given to the patient to ask questions, all of which were answered to the patient's satisfaction. Ms. Bottari has indicated that she wanted to continue with the procedure. Attestation: I, the ordering provider, attest that I have discussed with the patient the benefits, risks, side-effects, alternatives, likelihood of achieving goals, and potential problems during recovery for the procedure that I have provided informed consent. Date   Time: 10/18/2018  9:38 AM  Pre-Procedure Preparation:  Monitoring: As per clinic protocol. Respiration, ETCO2, SpO2, BP, heart rate and rhythm monitor placed and checked for adequate function Safety Precautions: Patient was assessed for positional comfort and pressure points before starting the procedure. Time-out: I initiated and conducted the "Time-out" before starting the procedure, as per protocol. The patient was asked to participate by confirming the accuracy of the "Time Out" information. Verification of the correct person, site, and procedure were performed and confirmed by me, the nursing staff, and the patient. "Time-out" conducted as per Joint Commission's Universal Protocol (UP.01.01.01). Time: 1012  Description of Procedure:          Area Prepped: Entire shoulder and  scapular area Prepping solution: DuraPrep (Iodine Povacrylex [0.7% available iodine] and Isopropyl Alcohol, 74% w/w) Safety Precautions: Aspiration looking for blood return was conducted prior to all injections. At no point did we inject any substances, as a needle was being advanced. No attempts were made at seeking any paresthesias. Safe injection practices and needle  disposal techniques used. Medications properly checked for expiration dates. SDV (single dose vial) medications used. Description of the Procedure: Protocol guidelines were followed. The patient was placed in position over the procedure table. The target area was identified and the area prepped in the usual manner. The skin and muscle were infiltrated with local anesthetic. Appropriate amount of time allowed to pass for local anesthetics to take effect. Radiofrequency needles were introduced to the target area using fluoroscopic guidance. Using the NeuroTherm NT1100 Radiofrequency Generator, sensory stimulation using 50 Hz was used to locate & identify the nerve, making sure that the needle was positioned such that there was no sensory stimulation below 0.3 V or above 0.7 V. Stimulation using 2 Hz was used to evaluate the motor component. Care was taken not to lesion any nerves that demonstrated motor stimulation of the lower extremities at an output of less than 2.5 times that of the sensory threshold, or a maximum of 2.0 V. Once satisfactory placement of the needles was achieved, the numbing solution was slowly injected after negative aspiration. After waiting for at least 2 minutes, the ablation was performed at 80 degrees C for 60 seconds, using regular Radiofrequency settings. Once the procedure was completed, the needles were then removed and the area cleansed, making sure to leave some of the prepping solution back to take advantage of its long term bactericidal properties. Intra-operative Compliance: Compliant   Vitals:   10/18/18  1026 10/18/18 1031 10/18/18 1036 10/18/18 1037  BP: 130/83 (!) 132/91 (!) 149/107 119/81  Pulse:      Resp: (!) 22 15 20 20   Temp:      TempSrc:      SpO2: 96% 95% 95% 97%  Weight:      Height:        Start Time: 1012 hrs. End Time: 1037 hrs.  Materials & Medications:  Needle(s) Type: Teflon-coated, curved tip, Radiofrequency needle(s) Gauge: 22G Length: 10cm Medication(s): Please see orders for medications and dosing details.  Imaging Guidance (Non-Spinal):          Type of Imaging Technique: Fluoroscopy Guidance (Non-Spinal) Indication(s): Assistance in needle guidance and placement for procedures requiring needle placement in or near specific anatomical locations not easily accessible without such assistance. Exposure Time: Please see nurses notes. Contrast: Before injecting any contrast, we confirmed that the patient did not have an allergy to iodine, shellfish, or radiological contrast. Once satisfactory needle placement was completed at the desired level, radiological contrast was injected. Contrast injected under live fluoroscopy. No contrast complications. See chart for type and volume of contrast used. Fluoroscopic Guidance: I was personally present during the use of fluoroscopy. "Tunnel Vision Technique" used to obtain the best possible view of the target area. Parallax error corrected before commencing the procedure. "Direction-depth-direction" technique used to introduce the needle under continuous pulsed fluoroscopy. Once target was reached, antero-posterior, oblique, and lateral fluoroscopic projection used confirm needle placement in all planes. Images permanently stored in EMR. Interpretation: I personally interpreted the imaging intraoperatively. Adequate needle placement confirmed in multiple planes. Appropriate spread of contrast into desired area was observed. No evidence of afferent or efferent intravascular uptake. Permanent images saved into the patient's  record.  Antibiotic Prophylaxis:   Anti-infectives (From admission, onward)   None     Indication(s): None identified  Post-operative Assessment:  Post-procedure Vital Signs:  Pulse/HCG Rate: 9791 Temp: 98.6 F (37 C) Resp: 20 BP: 119/81 SpO2: 97 %  EBL: None  Complications: No immediate post-treatment complications observed by  team, or reported by patient.  Note: The patient tolerated the entire procedure well. A repeat set of vitals were taken after the procedure and the patient was kept under observation following institutional policy, for this type of procedure. Post-procedural neurological assessment was performed, showing return to baseline, prior to discharge. The patient was provided with post-procedure discharge instructions, including a section on how to identify potential problems. Should any problems arise concerning this procedure, the patient was given instructions to immediately contact us, at any time, without hesitation. In any case, we plan to contact the patient by telephone for a follow-up status report regarding this interventional procedure.  Comments:  No additional relevant information.  Plan of Care  Orders:  Orders Placed This Encounter  Procedures   Radiofrequency Shoulder Joint    For shoulder pain.    Scheduling Instructions:     Laterality: Bilateral     Level(s): Suprascapular nerve     Sedation: With Sedation     Scheduling Timeframe: Today    Order Specific Question:   Where will this procedure be performed?    Answer:   [facility]   DG PA[facility] C-ARM 1-60 MIN NO REPORT    Intraoperative interpretation by procedural physician at [place].    Standing Status:   Standing    Number of Occurrences:   1    Order Specific Question:   Reason for exam:    Answer:   Assistance in needle guidance and placement for procedures requiring needle placement in or near specific anatomical locations not easily accessible without  such assistance.   ToxASSURE Select 13 (MW), Urine    Volume: 30 ml(s). Minimum 3 ml of urine is needed. Document temperature of fresh sample. Indications: Long term (current) use of opiate analgesic [id number])   Informed Consent Details: Physician/Practitioner Attestation; Transcribe to consent form and obtain patient signature    Provider Attestation: I, [place]. Dossie Arbour, MD, (Pain Management Specialist), the physician/practitioner, attest that I have discussed with the patient the benefits, risks, side effects, alternatives, likelihood of achieving goals and potential problems during recovery for the procedure that I have provided informed consent.    Scheduling Instructions:     Procedure: Bilateral suprascapular RFA     Indication/Reason: Chronic Bilateral Shoulder Pain     Nursing Order: Transcribe to consent form and obtain patient signature.     Note: Always confirm laterality of pain with Ms. Mylin, before procedure.   Provide equipment / supplies at bedside    Equipment required: Single use, disposable, "Block Tray"    Standing Status:   Standing    Number of Occurrences:   1    Order Specific Question:   Specify    Answer:   Block Tray   Nursing Instructions:    1). STAT: UDS required today. 2). Make sure to document all opioids and benzodiazepines taken, including time of last intake. 3). If order is entered on a procedure day, make sure sample is obtained before any medications are administered.   Chronic Opioid Analgesic:  Oxycodone IR 5 mg every 8 hours (15 mg/day of oxycodone). High-Risk for SUD. Prior hx. of Cocaine dependence & Schizoaffective disorder. MME/day: 22.5 mg/day.   Medications ordered for procedure: Meds ordered this encounter  Medications   iohexol (OMNIPAQUE) 180 MG/ML injection 10 mL    Must be Myelogram-compatible. If not available, you may substitute with a water-soluble, non-ionic, hypoallergenic, myelogram-compatible radiological contrast  medium.   lidocaine (XYLOCAINE) 2 % (  with pres) injection 400 mg   DISCONTD: lactated ringers infusion 1,000 mL   DISCONTD: midazolam (VERSED) 5 MG/5ML injection 1-2 mg    Make sure Flumazenil is available in the pyxis when using this medication. If oversedation occurs, administer 0.2 mg IV over 15 sec. If after 45 sec no response, administer 0.2 mg again over 1 min; may repeat at 1 min intervals; not to exceed 4 doses (1 mg)   DISCONTD: fentaNYL (SUBLIMAZE) injection 25-50 mcg    Make sure Narcan is available in the pyxis when using this medication. In the event of respiratory depression (RR< 8/min): Titrate NARCAN (naloxone) in increments of 0.1 to 0.2 mg IV at 2-3 minute intervals, until desired degree of reversal.   ropivacaine (PF) 2 mg/mL (0.2%) (NAROPIN) injection 9 mL   methylPREDNISolone acetate (DEPO-MEDROL) injection 80 mg   oxyCODONE (OXY IR/ROXICODONE) 5 MG immediate release tablet    Sig: Take 1 tablet (5 mg total) by mouth every 8 (eight) hours as needed for severe pain. Must last 30 days.    Dispense:  90 tablet    Refill:  0    Chronic Pain: STOP Act (Not applicable) Fill 1 day early if closed on refill date. Do not fill until: 11/05/2018. To last until: 12/05/2018. Avoid benzodiazepines within 8 hours of opioids   HYDROcodone-acetaminophen (NORCO/VICODIN) 5-325 MG tablet    Sig: Take 1 tablet by mouth every 8 (eight) hours as needed for up to 7 days for severe pain. Must last 7 days.    Dispense:  21 tablet    Refill:  0    For acute post-operative pain. Not to be refilled. Must last 7 days.   HYDROcodone-acetaminophen (NORCO/VICODIN) 5-325 MG tablet    Sig: Take 1 tablet by mouth every 8 (eight) hours as needed for up to 7 days for severe pain. Must last for 7 days.    Dispense:  21 tablet    Refill:  0    For acute post-operative pain. Not to be refilled. Must last 7 days.   Medications administered: We administered iohexol, lidocaine, ropivacaine (PF) 2 mg/mL  (0.2%), and methylPREDNISolone acetate.  See the medical record for exact dosing, route, and time of administration.  Follow-up plan:   Return in about 6 weeks (around 11/29/2018) for (VV), (PP), (MM).       Interventional management considerations: Considering:   NOTE: Currently awaiting to have a right total hip replacement by Dr. Mardelle Matte. Possible bilateral suprascapular nerve RFA  Diagnostic right CESI  Diagnostic bilateral cervical facet block  Possible bilateral cervical facet RFA  Diagnostic bilateral lumbar facet blocks  Possible bilateral lumbar facet RFA  Diagnostic right LESI  Diagnostic right-sided versus bilateral TFESI  Diagnostic bilateral SI joint block  Possible bilateral SI joint RFA  Diagnostic right-sided genicular NB  Possible right-sided genicular nerve RFA    PRN Procedures:   Palliative right IA hip joint injection #3  Diagnostic bilateral suprascapular nerve block #3  Palliative bilateral IA shoulder joint injection #2      Recent Visits Date Type Provider Dept  09/26/18 Office Visit Milinda Pointer, MD [facility]  09/06/18 Procedure visit Milinda Pointer, MD [facility]  08/07/18 Office Visit Milinda Pointer, MD [facility]  Showing recent visits within past 90 days and meeting all other requirements   Today's Visits Date Type Provider Dept  10/18/18 Procedure visit Milinda Pointer, MD [facility]  Showing today's visits and meeting all other requirements  Future Appointments Date Type Provider Dept  10/29/18 Appointment Milinda Pointer, MD [facility]  12/03/18 Appointment Milinda Pointer, MD [facility]  Showing future appointments within next 90 days and meeting all other requirements   Disposition: Discharge home  Discharge Date & Time: 10/18/2018; 1050 hrs.   Primary Care Physician: Petra Kuba, MD Location: [facility]  Facility Note by: Gaspar Cola, MD Date: 10/18/2018; Time: 10:56 AM  Disclaimer:  Medicine is not an exact science. The only guarantee in medicine is that nothing is guaranteed. It is important to note that the decision to proceed with this intervention was based on the information collected from the patient. The Data and conclusions were drawn from the patient's questionnaire, the interview, and the physical examination. Because the information was provided in large part by the patient, it cannot be guaranteed that it has not been purposely or unconsciously manipulated. Every effort has been made to obtain as much relevant data as possible for this evaluation. It is important to note that the conclusions that lead to this procedure are derived in large part from the available data. Always take into account that the treatment will also be dependent on availability of resources and existing treatment guidelines, considered by other Pain Management Practitioners as being common knowledge and practice, at the time of the intervention. For Medico-Legal purposes, it is also important to point out that variation in procedural techniques and pharmacological choices are the acceptable norm. The indications, contraindications, technique, and results of the above procedure should only be interpreted and judged by a Board-Certified Interventional Pain Specialist with extensive familiarity and expertise in the same exact procedure and technique.

## 2018-10-17 NOTE — Patient Instructions (Addendum)
___________________________________________________________________________________________  Post-Radiofrequency (RF) Discharge Instructions  You have just completed a Radiofrequency Neurotomy.  The following instructions will provide you with information and guidelines for self-care upon discharge.  If at any time you have questions or concerns please call your physician. DO NOT DRIVE YOURSELF!!  Instructions:  Apply ice: Fill a plastic sandwich bag with crushed ice. Cover it with a small towel and apply to injection site. Apply for 15 minutes then remove x 15 minutes. Repeat sequence on day of procedure, until you go to bed. The purpose is to minimize swelling and discomfort after procedure.  Apply heat: Apply heat to procedure site starting the day following the procedure. The purpose is to treat any soreness and discomfort from the procedure.  Food intake: No eating limitations, unless stipulated above.  Nevertheless, if you have had sedation, you may experience some nausea.  In this case, it may be wise to wait at least two hours prior to resuming regular diet.  Physical activities: Keep activities to a minimum for the first 8 hours after the procedure. For the first 24 hours after the procedure, do not drive a motor vehicle,  Operate heavy machinery, power tools, or handle any weapons.  Consider walking with the use of an assistive device or accompanied by an adult for the first 24 hours.  Do not drink alcoholic beverages including beer.  Do not make any important decisions or sign any legal documents. Go home and rest today.  Resume activities tomorrow, as tolerated.  Use caution in moving about as you may experience mild leg weakness.  Use caution in cooking, use of household electrical appliances and climbing steps.  Driving: If you have received any sedation, you are not allowed to drive for 24 hours after your procedure.  Blood thinner: Restart your blood thinner 6 hours after your  procedure. (Only for those taking blood thinners)  Insulin: As soon as you can eat, you may resume your normal dosing schedule. (Only for those taking insulin)  Medications: May resume pre-procedure medications.  Do not take any drugs, other than what has been prescribed to you.  Infection prevention: Keep procedure site clean and dry.  Post-procedure Pain Diary: Extremely important that this be done correctly and accurately. Recorded information will be used to determine the next step in treatment.  Pain evaluated is that of treated area only. Do not include pain from an untreated area.  Complete every hour, on the hour, for the initial 8 hours. Set an alarm to help you do this part accurately.  Do not go to sleep and have it completed later. It will not be accurate.  Follow-up appointment: Keep your follow-up appointment after the procedure. Usually 2 weeks for most procedures. (6 weeks in the case of radiofrequency.) Bring you pain diary.   Expect:  From numbing medicine (AKA: Local Anesthetics): Numbness or decrease in pain.  Onset: Full effect within 15 minutes of injected.  Duration: It will depend on the type of local anesthetic used. On the average, 1 to 8 hours.   From steroids: Decrease in swelling or inflammation. Once inflammation is improved, relief of the pain will follow.  Onset of benefits: Depends on the amount of swelling present. The more swelling, the longer it will take for the benefits to be seen. In some cases, up to 10 days.  Duration: Steroids will stay in the system x 2 weeks. Duration of benefits will depend on multiple posibilities including persistent irritating factors.  From procedure: Some   discomfort is to be expected once the numbing medicine wears off. This should be minimal if ice and heat are applied as instructed.  Call if:  You experience numbness and weakness that gets worse with time, as opposed to wearing off.  He experience any unusual  bleeding, difficulty breathing, or loss of the ability to control your bowel and bladder. (This applies to Spinal procedures only)  You experience any redness, swelling, heat, red streaks, elevated temperature, fever, or any other signs of a possible infection.  Emergency Numbers:  [place] hours (Monday - Thursday, 8:00 AM - 4:00 PM) (Friday, 9:00 AM - 12:00 Noon): [phone]  After hours: [phone] ____________________________________________________________________________________________ Two prescriptions for Hydrocodone and one for Oxycodone have been sent to your pharmacy.

## 2018-10-18 ENCOUNTER — Other Ambulatory Visit: Payer: Self-pay

## 2018-10-18 ENCOUNTER — Other Ambulatory Visit: Payer: Self-pay | Admitting: Pain Medicine

## 2018-10-18 ENCOUNTER — Encounter: Payer: Self-pay | Admitting: Pain Medicine

## 2018-10-18 ENCOUNTER — Ambulatory Visit
Admission: RE | Admit: 2018-10-18 | Discharge: 2018-10-18 | Disposition: A | Discharge status: Home / Self Care | Payer: Medicare Other | Source: Ambulatory Visit | Attending: Pain Medicine | Admitting: Pain Medicine

## 2018-10-18 ENCOUNTER — Ambulatory Visit (HOSPITAL_BASED_OUTPATIENT_CLINIC_OR_DEPARTMENT_OTHER): Payer: Medicare Other | Admitting: Pain Medicine

## 2018-10-18 VITALS — BP 119/81 | HR 97 | Temp 98.6°F | Resp 20 | Ht 68.0 in | Wt 172.0 lb

## 2018-10-18 DIAGNOSIS — M19012 Primary osteoarthritis, left shoulder: Secondary | ICD-10-CM | POA: Insufficient documentation

## 2018-10-18 DIAGNOSIS — G8929 Other chronic pain: Secondary | ICD-10-CM

## 2018-10-18 DIAGNOSIS — Z79899 Other long term (current) drug therapy: Secondary | ICD-10-CM | POA: Diagnosis present

## 2018-10-18 DIAGNOSIS — G894 Chronic pain syndrome: Secondary | ICD-10-CM | POA: Insufficient documentation

## 2018-10-18 DIAGNOSIS — M25511 Pain in right shoulder: Secondary | ICD-10-CM

## 2018-10-18 DIAGNOSIS — M19011 Primary osteoarthritis, right shoulder: Secondary | ICD-10-CM | POA: Diagnosis present

## 2018-10-18 DIAGNOSIS — G8918 Other acute postprocedural pain: Secondary | ICD-10-CM | POA: Insufficient documentation

## 2018-10-18 DIAGNOSIS — M792 Neuralgia and neuritis, unspecified: Secondary | ICD-10-CM

## 2018-10-18 DIAGNOSIS — M25512 Pain in left shoulder: Secondary | ICD-10-CM | POA: Insufficient documentation

## 2018-10-18 MED ORDER — METHYLPREDNISOLONE ACETATE 80 MG/ML IJ SUSP
80.0000 mg | Freq: Once | INTRAMUSCULAR | Status: AC
  Administered 2018-10-18: 80 mg
  Filled 2018-10-18: qty 1

## 2018-10-18 MED ORDER — MIDAZOLAM HCL 5 MG/5ML IJ SOLN: 1.0000 mg | INTRAMUSCULAR | Status: DC | PRN

## 2018-10-18 MED ORDER — ROPIVACAINE HCL 2 MG/ML IJ SOLN
9.0000 mL | Freq: Once | INTRAMUSCULAR | Status: AC
  Administered 2018-10-18: 10 mL via PERINEURAL
  Filled 2018-10-18: qty 10

## 2018-10-18 MED ORDER — FENTANYL CITRATE (PF) 100 MCG/2ML IJ SOLN: 25.0000 ug | INTRAMUSCULAR | Status: DC | PRN

## 2018-10-18 MED ORDER — CYCLOBENZAPRINE HCL 10 MG PO TABS: 10.0000 mg | ORAL_TABLET | Freq: Two times a day (BID) | ORAL | 5 refills | Status: AC

## 2018-10-18 MED ORDER — OXYCODONE HCL 5 MG PO TABS: 5.0000 mg | ORAL_TABLET | Freq: Three times a day (TID) | ORAL | 0 refills | Status: DC | PRN

## 2018-10-18 MED ORDER — LACTATED RINGERS IV SOLN: 1000.0000 mL | Freq: Once | INTRAVENOUS | Status: DC

## 2018-10-18 MED ORDER — LIDOCAINE HCL 2 % IJ SOLN
20.0000 mL | Freq: Once | INTRAMUSCULAR | Status: AC
  Administered 2018-10-18: 10:00:00 400 mg
  Filled 2018-10-18: qty 20

## 2018-10-18 MED ORDER — IOHEXOL 180 MG/ML  SOLN
10.0000 mL | Freq: Once | INTRAMUSCULAR | Status: AC
  Administered 2018-10-18: 10 mL via EPIDURAL
  Filled 2018-10-18: qty 20

## 2018-10-18 MED ORDER — GABAPENTIN 100 MG PO CAPS: ORAL_CAPSULE | ORAL | 5 refills | Status: AC

## 2018-10-18 MED ORDER — HYDROCODONE-ACETAMINOPHEN 5-325 MG PO TABS: 1.0000 | ORAL_TABLET | Freq: Three times a day (TID) | ORAL | 0 refills | Status: AC | PRN

## 2018-10-22 ENCOUNTER — Other Ambulatory Visit: Payer: Self-pay

## 2018-10-22 ENCOUNTER — Telehealth: Payer: Self-pay | Admitting: Pain Medicine

## 2018-10-22 DIAGNOSIS — Z20822 Contact with and (suspected) exposure to covid-19: Secondary | ICD-10-CM

## 2018-10-22 LAB — TOXASSURE SELECT 13 (MW), URINE

## 2018-10-22 NOTE — Telephone Encounter (Signed)
Patient states she is on her waya to get a Covid test.  Instructed patient to let us know if she is positive.

## 2018-10-22 NOTE — Telephone Encounter (Signed)
Patient had procedure on Thursday.  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 The family of this lady have also tested positive.  Patient would like to speak with a nurse about any increased risks from having had a procedure w/steroids and being exposed to Covid so much.   Please call patient as she is pretty shaken up.

## 2018-10-23 ENCOUNTER — Telehealth: Payer: Self-pay | Admitting: Pain Medicine

## 2018-10-23 NOTE — Telephone Encounter (Signed)
No result yet. Patient notified.

## 2018-10-23 NOTE — Telephone Encounter (Signed)
Patient has tried to set up MyChart to check her covid test. Wanted to see if a nurse could pull it up and call her with results.

## 2018-10-24 ENCOUNTER — Telehealth: Payer: Self-pay | Admitting: Pain Medicine

## 2018-10-24 LAB — NOVEL CORONAVIRUS, NAA: SARS-CoV-2, NAA: NOT DETECTED

## 2018-10-24 NOTE — Telephone Encounter (Signed)
Patient notified of Covid test results.

## 2018-10-24 NOTE — Telephone Encounter (Signed)
Patient called stating she has a severe headache and aching all over, her mother has bad cough. 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  Patient would like Nurse to look in her chart and check her test results and let her know results.

## 2018-10-26 ENCOUNTER — Other Ambulatory Visit: Payer: Self-pay

## 2018-10-26 DIAGNOSIS — Z20822 Contact with and (suspected) exposure to covid-19: Secondary | ICD-10-CM

## 2018-10-28 LAB — NOVEL CORONAVIRUS, NAA: SARS-CoV-2, NAA: NOT DETECTED

## 2018-10-29 ENCOUNTER — Ambulatory Visit: Payer: Medicare Other | Admitting: Pain Medicine

## 2018-10-29 ENCOUNTER — Telehealth: Payer: Self-pay | Admitting: Pain Medicine

## 2018-10-29 NOTE — Telephone Encounter (Signed)
States pain is actually better after doing rubber band exercises. Advised that results of RFA may take 6 weeks. As for the diarrhea, probably not related to RFA. Advised to see PCP for diarrhea.

## 2018-10-29 NOTE — Telephone Encounter (Signed)
Patient called stating she is in pain all over and has sharp pains going thru her head, has diarhea. Says last procedure did not help. Wanted Dr. Dossie Bruce to know what is going on with her. I ask if she has contacted her PCP and she says no, they are hard to reach.

## 2018-10-29 NOTE — Telephone Encounter (Signed)
Procedure was performed on 10/18/2018 RF. Patient has not had her follow up yet. Dena called and talked with patient and encouraged her to see her PCP.

## 2018-11-28 ENCOUNTER — Encounter: Payer: Self-pay | Admitting: Pain Medicine

## 2018-11-28 NOTE — Progress Notes (Signed)
Pain relief after procedure (treated area only): (Questions asked to patient) 1. Starting about 15 minutes after the procedure, and "while the area was still numb" (from the local anesthetics), were you having any of your usual pain "in that area"? (NOT including the discomfort from the needle sticks.) First 1 hour: 100 % better. First 4-6 hours: 100 % better. 2. Assuming that it did get numb. How long was the area numb? 100 % benefit, longer than 6 hours. How long? 12hrs. 3. How much better is your pain now, when compared to before the procedure? Current benefit: 0 % better. 4. Can you move better now? Improvement in ROM (Range of Motion): No. 5. Can you do more now? Improvement in function: No. 4. Did you have any problems with the procedure? Side-effects/Complications: Patient states she tested positive with covid the next week.Marland Kitchen

## 2018-12-02 NOTE — Progress Notes (Deleted)
Pain Management Virtual Encounter Note - Virtual Visit via Telephone Telehealth (real-time audio visits between healthcare provider and patient).   Patient's Phone No. & Preferred Pharmacy:  [phone] (home); [phone] (mobile); (Preferred) [phone] [email or web address]  [place], [place] [address] [facility] [place] [address] Phone: [phone] Fax: [phone]    Pre-screening note:  Our staff contacted Ashlee Bruce and offered her an "in person", "face-to-face" appointment versus a telephone encounter. She indicated preferring the telephone encounter, at this time.   Reason for Virtual Visit: COVID-19*  Social distancing based on CDC and AMA recommendations.   I contacted Ashlee Bruce on 12/03/2018 via telephone.      I clearly identified myself as Gaspar Cola, MD. I verified that I was speaking with the correct person using two identifiers (Name: Ashlee Bruce, and date of birth: [birth year]).  Advanced Informed Consent I sought verbal advanced consent from Ashlee Bruce for virtual visit interactions. I informed Ashlee Bruce of possible security and privacy concerns, risks, and limitations associated with providing "not-in-person" medical evaluation and management services. I also informed Ashlee Bruce of the availability of "in-person" appointments. Finally, I informed her that there would be a charge for the virtual visit and that she could be  personally, fully or partially, financially responsible for it. Ashlee Bruce expressed understanding and agreed to proceed.   Historic Elements   Ashlee Bruce is a 63 y.o. year old, female patient evaluated today after her last encounter by our practice on 10/29/2018. Ashlee Bruce  has a past medical history of Bipolar disorder ([place]), Depression, Diabetes mellitus without complication ([place]), GERD (gastroesophageal reflux disease), Hypertension, Shortness of  breath, and Transfusion history. She also  has a past surgical history that includes Cholecystectomy; Breast surgery; Knee Arthroplasty; Thyroid surgery; Ectopic pregnancy surgery; Wrist surgery; Anterior cervical decomp/discectomy fusion (N/A, 07/26/2012); Joint replacement (Right, 2007); and Knee arthroscopy. Ashlee Bruce has a current medication list which includes the following prescription(s): acyclovir, chlorthalidone, cyclobenzaprine, docusate sodium, duloxetine, gabapentin, glucose blood, ibuprofen, jardiance, lovastatin, metformin, oxycodone, potassium chloride, and quetiapine. She  reports that she has never smoked. She has never used smokeless tobacco. She reports current alcohol use of about 1.0 standard drinks of alcohol per week. She reports that she does not use drugs. Ashlee Bruce is allergic to raloxifene.   HPI  Today, she is being contacted for both, medication management and a post-procedure assessment.  Post-Procedure Evaluation  Procedure (10/18/2018): Therapeutic bilateral suprascapular  Nerve RFA #1 under fluoroscopic guidance and IV sedation Pre-procedure pain level:  10/10 Post-procedure: 0/10 (100% relief)  Sedation: Sedation provided.  Ashlee Shorter, RN  11/28/2018 12:47 PM  Signed Pain relief after procedure (treated area only): (Questions asked to patient) 1. Starting about 15 minutes after the procedure, and "while the area was still numb" (from the local anesthetics), were you having any of your usual pain "in that area"? (NOT including the discomfort from the needle sticks.) First 1 hour: 100 % better. First 4-6 hours: 100 % better. 2. Assuming that it did get numb. How long was the area numb? 100 % benefit, longer than 6 hours. How long? 12hrs. 3. How much better is your pain now, when compared to before the procedure? Current benefit: 0 % better. 4. Can you move better now? Improvement in ROM (Range of Motion): No. 5. Can you do more now? Improvement in function:  No. 4. Did you have any  problems with the procedure? Side-effects/Complications: Patient states she tested positive with covid the next week..   Current benefits: Defined as benefit that persist at this time.   Analgesia:  Back to baseline Function: Back to baseline ROM: Back to baseline  Pharmacotherapy Assessment  Analgesic: Oxycodone IR 5 mg every 8 hours (15 mg/day of oxycodone). High-Risk for SUD. Prior hx. of Cocaine dependence & Schizoaffective disorder. MME/day: 22.5 mg/day.   Monitoring: Pharmacotherapy: No side-effects or adverse reactions reported. Ashlee Bruce: PDMP reviewed during this encounter.       Compliance: No problems identified. Effectiveness: Clinically acceptable. Plan: Refer to "POC".  UDS:  Summary  Date Value Ref Range Status  10/18/2018 Note  Final    Comment:    ==================================================================== [employer] Select 13 (MW) ==================================================================== Test                             Result       Flag       Units Drug Present not Declared for Prescription Verification   Cocaine                        62           UNEXPECTED ng/mg creat   Benzoylecgonine                >4808        UNEXPECTED ng/mg creat    Source of cocaine is most commonly illicit, but cocaine is present    in some topical anesthetic solutions. Benzoylecgonine is an expected    metabolite of cocaine. Drug Absent but Declared for Prescription Verification   Hydrocodone                    Not Detected UNEXPECTED ng/mg creat   Oxycodone                      Not Detected UNEXPECTED ng/mg creat ==================================================================== Test                      Result    Flag   Units      Ref Range   Creatinine              104              mg/dL      >=20 ==================================================================== Declared Medications:  The flagging and interpretation on this  report are based on the  following declared medications.  Unexpected results may arise from  inaccuracies in the declared medications.  **Note: The testing scope of this panel includes these medications:  Hydrocodone  Oxycodone  **Note: The testing scope of this panel does not include the  following reported medications:  Acetaminophen  Acyclovir  Chlorthalidone  Cyclobenzaprine  Docusate  Duloxetine  Empagliflozin (Jardiance)  Gabapentin  Ibuprofen  Lovastatin  Metformin  Potassium ([place])  Quetiapine ==================================================================== For clinical consultation, please call [phone]. ====================================================================    Laboratory Chemistry Profile (12 mo)  Renal: 04/27/2018: BUN 21; Creatinine, Ser 0.92  Lab Results  Component Value Date   GFRAA >60 04/27/2018   GFRNONAA >60 04/27/2018   Hepatic: 04/27/2018: Albumin 4.0 Lab Results  Component Value Date   AST 12 (L) 04/27/2018   ALT 14 04/27/2018   Other: 04/27/2018: 25-Hydroxy, Vitamin D 36; 25-Hydroxy, Vitamin D-2 <1.0; 25-Hydroxy, Vitamin D-3 36; CRP <0.8; Sed Rate  19; Vitamin B-12 371 Note: Above Lab results reviewed.  Imaging  DG PAIN CLINIC C-ARM 1-60 MIN NO REPORT Fluoro was used, but no Radiologist interpretation will be provided.  Please refer to "NOTES" tab for provider progress note.   Assessment  The primary encounter diagnosis was Chronic pain syndrome. A diagnosis of Chronic shoulder pain (Secondary Area of Pain) (Bilateral) (R>L) was also pertinent to this visit.  Plan of Care  Problem-specific:  No problem-specific Assessment & Plan notes found for this encounter.  I am having Ashlee Bruce. Garver maintain her docusate sodium, glucose blood, ibuprofen, lovastatin, QUEtiapine, metFORMIN, Jardiance, DULoxetine, acyclovir, potassium chloride, chlorthalidone, cyclobenzaprine, gabapentin, and oxyCODONE.  Pharmacotherapy  (Medications Ordered): No orders of the defined types were placed in this encounter.  Orders:  No orders of the defined types were placed in this encounter.  Follow-up plan:   Return in about 8 weeks (around 01/30/2019) for (VV), (MM).      Interventional management considerations: Considering:   NOTE: Currently awaiting to have a right total hip replacement by Dr. Mardelle Matte. Possible bilateral suprascapular nerve RFA  Diagnostic right CESI  Diagnostic bilateral cervical facet block  Possible bilateral cervical facet RFA  Diagnostic bilateral lumbar facet blocks  Possible bilateral lumbar facet RFA  Diagnostic right LESI  Diagnostic right-sided versus bilateral TFESI  Diagnostic bilateral SI joint block  Possible bilateral SI joint RFA  Diagnostic right-sided genicular NB  Possible right-sided genicular nerve RFA    PRN Procedures:   Palliative right IA hip joint injection #3  Diagnostic bilateral suprascapular nerve block #3  Palliative bilateral IA shoulder joint injection #2     Recent Visits Date Type Provider Dept  10/18/18 Procedure visit Milinda Pointer, MD [facility]  09/26/18 Office Visit Milinda Pointer, MD [facility]  09/06/18 Procedure visit Milinda Pointer, MD [facility]  Showing recent visits within past 90 days and meeting all other requirements   Future Appointments Date Type Provider Dept  12/03/18 Telemedicine Milinda Pointer, MD [facility]  Showing future appointments within next 90 days and meeting all other requirements   I discussed the assessment and treatment plan with the patient. The patient was provided an opportunity to ask questions and all were answered. The patient agreed with the plan and demonstrated an understanding of the instructions.  Patient advised to call back or seek an in-person evaluation if the symptoms or condition worsens.  Total duration of non-face-to-face encounter: ***  minutes.  Note by: Gaspar Cola, MD Date: 12/03/2018; Time: 5:53 PM  Note: This dictation was prepared with Dragon dictation. Any transcriptional errors that may result from this process are unintentional.  Disclaimer:  * Given the special circumstances of the COVID-19 pandemic, the federal government has announced that the Office for Civil Rights (OCR) will exercise its enforcement discretion and will not impose penalties on physicians using telehealth in the event of noncompliance with regulatory requirements under the [place] (HIPAA) in connection with the good faith provision of telehealth during the XX123456 national public health emergency. ([place])

## 2018-12-03 ENCOUNTER — Other Ambulatory Visit: Payer: Self-pay

## 2018-12-03 ENCOUNTER — Ambulatory Visit: Payer: Medicare Other | Attending: Pain Medicine | Admitting: Pain Medicine

## 2018-12-03 DIAGNOSIS — G894 Chronic pain syndrome: Secondary | ICD-10-CM

## 2018-12-03 NOTE — Progress Notes (Signed)
Unsuccessful attempt to contact patient for Virtual Visit (Pain Management Telehealth)   Patient provided contact information:  [phone] (home); [phone] (mobile); (Preferred) [phone] [email or web address]   Pre-screening:  Our staff was successful in contacting Ashlee Bruce using the above provided information.   I unsuccessfully attempted to make contact with Ashlee Bruce on 12/03/2018 via telephone. I was unable to complete the virtual encounter due to call going directly to voicemail. I was unable to leave a message due to the mailbox being full and not accepting any new messages.  Pharmacotherapy Assessment  Analgesic: Oxycodone IR 5 mg every 8 hours (15 mg/day of oxycodone). High-Risk for SUD. Prior hx. of Cocaine dependence & Schizoaffective disorder. MME/day: 22.5 mg/day.   Post-Procedure Evaluation  Procedure: Therapeutic bilateral suprascapular RFA #1 under fluoroscopic guidance, no sedation Pre-procedure pain level:  10/10 Post-procedure: 0/10 (100% relief)  Sedation: None.  Dewayne Shorter, RN  11/28/2018 12:47 PM  Signed Pain relief after procedure (treated area only): (Questions asked to patient) 1. Starting about 15 minutes after the procedure, and "while the area was still numb" (from the local anesthetics), were you having any of your usual pain "in that area"? (NOT including the discomfort from the needle sticks.) First 1 hour: 100 % better. First 4-6 hours: 100 % better. 2. Assuming that it did get numb. How long was the area numb? 100 % benefit, longer than 6 hours. How long? 12hrs. 3. How much better is your pain now, when compared to before the procedure? Current benefit: 0 % better. 4. Can you move better now? Improvement in ROM (Range of Motion): No. 5. Can you do more now? Improvement in function: No. 4. Did you have any problems with the procedure? Side-effects/Complications: Patient states she tested positive with covid the next  week..  Follow-up plan:   Reschedule Visit.     Interventional management considerations: Considering:   NOTE: Currently awaiting to have a right total hip replacement by Dr. Mardelle Matte. Possible bilateral suprascapular nerve RFA  Diagnostic right CESI  Diagnostic bilateral cervical facet block  Possible bilateral cervical facet RFA  Diagnostic bilateral lumbar facet blocks  Possible bilateral lumbar facet RFA  Diagnostic right LESI  Diagnostic right-sided versus bilateral TFESI  Diagnostic bilateral SI joint block  Possible bilateral SI joint RFA  Diagnostic right-sided genicular NB  Possible right-sided genicular nerve RFA    PRN Procedures:   Palliative right IA hip joint injection #3  Diagnostic bilateral suprascapular nerve block #3  Palliative bilateral IA shoulder joint injection #2       Recent Visits Date Type Provider Dept  10/18/18 Procedure visit Milinda Pointer, MD [facility]  09/26/18 Office Visit Milinda Pointer, MD [facility]  09/06/18 Procedure visit Milinda Pointer, MD [facility]  Showing recent visits within past 90 days and meeting all other requirements   Today's Visits Date Type Provider Dept  12/03/18 Telemedicine Milinda Pointer, MD [facility]  Showing today's visits and meeting all other requirements   Future Appointments No visits were found meeting these conditions.  Showing future appointments within next 90 days and meeting all other requirements    Note by: Gaspar Cola, MD Date: 12/03/2018; Time: 9:51 AM

## 2018-12-04 ENCOUNTER — Encounter: Payer: Self-pay | Admitting: Pain Medicine

## 2018-12-04 NOTE — Progress Notes (Signed)
Pain Management Virtual Encounter Note - Virtual Visit via Telephone Telehealth (real-time audio visits between healthcare provider and patient).   Patient's Phone No. & Preferred Pharmacy:  [phone] (home); [phone] (mobile); (Preferred) [phone] [email or web address]  [place], [place] - [address] [place] [address] [facility] [place] [address] Phone: [phone] Fax: [phone]    Pre-screening note:  Our staff contacted Ashlee Bruce and offered her an "in person", "face-to-face" appointment versus a telephone encounter. She indicated preferring the telephone encounter, at this time.   Reason for Virtual Visit: COVID-19*  Social distancing based on CDC and AMA recommendations.   I contacted Ashlee Bruce on 12/05/2018 via telephone.      I clearly identified myself as Gaspar Cola, MD. I verified that I was speaking with the correct person using two identifiers (Name: Ashlee Bruce, and date of birth: 19-Aug-1955).  Advanced Informed Consent I sought verbal advanced consent from Ashlee Bruce for virtual visit interactions. I informed Ashlee Bruce of possible security and privacy concerns, risks, and limitations associated with providing "not-in-person" medical evaluation and management services. I also informed Ashlee Bruce of the availability of "in-person" appointments. Finally, I informed her that there would be a charge for the virtual visit and that she could be  personally, fully or partially, financially responsible for it. Ashlee Bruce expressed understanding and agreed to proceed.   Historic Elements   Ashlee Bruce is a 63 y.o. year old, female patient evaluated today after her last encounter by our practice on 10/29/2018. Ashlee Bruce  has a past medical history of Bipolar disorder ([place]), Depression, Diabetes mellitus without complication ([place]), GERD (gastroesophageal reflux disease), Hypertension, Shortness of  breath, and Transfusion history. She also  has a past surgical history that includes Cholecystectomy; Breast surgery; Knee Arthroplasty; Thyroid surgery; Ectopic pregnancy surgery; Wrist surgery; Anterior cervical decomp/discectomy fusion (N/A, 07/26/2012); Joint replacement (Right, 2007); and Knee arthroscopy. Ashlee Bruce has a current medication list which includes the following prescription(s): acyclovir, chlorthalidone, cyclobenzaprine, docusate sodium, duloxetine, gabapentin, glucose blood, ibuprofen, jardiance, lovastatin, metformin, oxycodone, oxycodone, potassium chloride, and quetiapine. She  reports that she has never smoked. She has never used smokeless tobacco. She reports current alcohol use of about 1.0 standard drinks of alcohol per week. She reports that she does not use drugs. Ashlee Bruce is allergic to raloxifene.   HPI  Today, she is being contacted for both, medication management and a post-procedure assessment.  The patient indicates that she was hospitalized with Covid around November 13th.  After she was discharged from the hospital she was told to continue quarantine and at this point she indicates that the shoulders are actually doing much better and today over video we checked on her range of motion and she was able to touch her thumbs over her head, something that she could not do before.  She indicates that it continues to improve and she is hopeful that this trend will continue further.  She would also like to proceed with another set of injections for her lower back, but in view of the fact that she recently had COVID-19, we will hold until January before we do that.  Post-Procedure Evaluation  Procedure (10/18/2018): Therapeutic bilateral suprascapular RFA #1 under fluoroscopic guidance, no sedation Pre-procedure pain level:  10/10 Post-procedure: 0/10 (100% relief)  Sedation: None.  Janett Billow, RN  12/04/2018  9:14 AM  Sign when Signing Visit Pain relief after  procedure (treated area  only): (Questions asked to patient) 1. Starting about 15 minutes after the procedure, and "while the area was still numb" (from the local anesthetics), were you having any of your usual pain "in that area"? (NOT including the discomfort from the needle sticks.) First 1 hour: 100 % better. First 4-6 hours: 100 % better. 2. Assuming that it did get numb. How long was the area numb? 0 % benefit, longer than 6 hours. How long? none. 3. How much better is your pain now, when compared to before the procedure? Current benefit: 70 % better. 4. Can you move better now? Improvement in ROM (Range of Motion): Yes. 5. Can you do more now? Improvement in function: Yes. 4. Did you have any problems with the procedure? Side-effects/Complications: No.   Current benefits: Defined as benefit that persist at this time.   Analgesia:  >75% relief Function: Ashlee Bruce reports improvement in function ROM: Ashlee Bruce reports improvement in ROM  Pharmacotherapy Assessment  Analgesic: Oxycodone IR 5 mg every 8 hours (15 mg/day of oxycodone). High-Risk for SUD. Prior hx. of Cocaine dependence & Schizoaffective disorder. MME/day: 22.5 mg/day.   Monitoring: Pharmacotherapy: No side-effects or adverse reactions reported. [place] PMP: PDMP reviewed during this encounter.       Compliance: No problems identified. Effectiveness: Clinically acceptable. Plan: Refer to "POC".  UDS:  Summary  Date Value Ref Range Status  10/18/2018 Note  Final    Comment:    ==================================================================== [employer] Select 13 (MW) ==================================================================== Test                             Result       Flag       Units Drug Present not Declared for Prescription Verification   Cocaine                        62           UNEXPECTED ng/mg creat   Benzoylecgonine                >4808        UNEXPECTED ng/mg creat    Source of cocaine is  most commonly illicit, but cocaine is present    in some topical anesthetic solutions. Benzoylecgonine is an expected    metabolite of cocaine. Drug Absent but Declared for Prescription Verification   Hydrocodone                    Not Detected UNEXPECTED ng/mg creat   Oxycodone                      Not Detected UNEXPECTED ng/mg creat ==================================================================== Test                      Result    Flag   Units      Ref Range   Creatinine              104              mg/dL      >=20 ==================================================================== Declared Medications:  The flagging and interpretation on this report are based on the  following declared medications.  Unexpected results may arise from  inaccuracies in the declared medications.  **Note: The testing scope of this panel includes these medications:  Hydrocodone  Oxycodone  **Note: The testing scope of this panel  does not include the  following reported medications:  Acetaminophen  Acyclovir  Chlorthalidone  Cyclobenzaprine  Docusate  Duloxetine  Empagliflozin (Jardiance)  Gabapentin  Ibuprofen  Lovastatin  Metformin  Potassium (Klor-Con)  Quetiapine ==================================================================== For clinical consultation, please call [phone]. ====================================================================    Laboratory Chemistry Profile (12 mo)  Renal: 04/27/2018: BUN 21; Creatinine, Ser 0.92  Lab Results  Component Value Date   GFRAA >60 04/27/2018   GFRNONAA >60 04/27/2018   Hepatic: 04/27/2018: Albumin 4.0 Lab Results  Component Value Date   AST 12 (L) 04/27/2018   ALT 14 04/27/2018   Other: 04/27/2018: 25-Hydroxy, Vitamin D 36; 25-Hydroxy, Vitamin D-2 <1.0; 25-Hydroxy, Vitamin D-3 36; CRP <0.8; Sed Rate 19; Vitamin B-12 371 Note: Above Lab results reviewed.  Imaging  DG PAIN CLINIC C-ARM 1-60 MIN NO REPORT Fluoro was used,  but no Radiologist interpretation will be provided.  Please refer to "NOTES" tab for provider progress note.   Assessment  The primary encounter diagnosis was Chronic low back pain [facility] Area of Pain) (Bilateral) (R>L) w/o sciatica. Diagnoses of Lumbar facet syndrome (Bilateral) (R>L), Chronic hip pain (Primary area of Pain) (Right), Chronic shoulder pain (Secondary Area of Pain) (Bilateral) (R>L), Chronic knee pain (Fourth Area of Pain) (Right), Chronic pain syndrome, Chronic musculoskeletal pain, Neurogenic pain, Pharmacologic therapy, and History of 2019 novel coronavirus disease (COVID-19) were also pertinent to this visit.  Plan of Care  Problem-specific:  No problem-specific Assessment & Plan notes found for this encounter.  I have changed Ashlee Bruce. Signer's oxyCODONE. I am also having her start on oxyCODONE. Additionally, I am having her maintain her docusate sodium, glucose blood, ibuprofen, lovastatin, QUEtiapine, metFORMIN, Jardiance, DULoxetine, acyclovir, potassium chloride, chlorthalidone, cyclobenzaprine, and gabapentin.  Pharmacotherapy (Medications Ordered): Meds ordered this encounter  Medications  . oxyCODONE (OXY IR/ROXICODONE) 5 MG immediate release tablet    Sig: Take 1 tablet (5 mg total) by mouth every 8 (eight) hours as needed for severe pain (Not to be used daily.). Must last minimum: 30 days. Max: 3/day. Do not mix w/ alcohol or sedatives, may cause death.    Dispense:  90 tablet    Refill:  0    Chronic Pain: STOP Act (Not applicable) Fill 1 day early if closed on refill date. Do not fill until: 12/05/2018. To last until: 01/04/2019. Avoid benzodiazepines within 8 hours of opioids  . oxyCODONE (OXY IR/ROXICODONE) 5 MG immediate release tablet    Sig: Take 1 tablet (5 mg total) by mouth every 8 (eight) hours as needed for severe pain (Not to be used daily.). Must last minimum: 30 days. Max: 3/day. Do not mix w/ alcohol or sedatives, may cause death.    Dispense:  90  tablet    Refill:  0    Chronic Pain: STOP Act (Not applicable) Fill 1 day early if closed on refill date. Do not fill until: 01/04/2019. To last until: 02/03/2019. Avoid benzodiazepines within 8 hours of opioids   Orders:  Orders Placed This Encounter  Procedures  . L-FCT Blk (Schedule)    Standing Status:   Future    Standing Expiration Date:   01/05/2019    Scheduling Instructions:     Procedure: Lumbar facet block (AKA.: Lumbosacral medial branch nerve block)     Side: Bilateral     Level: L3-4, L4-5, & L5-S1 Facets (L2, L3, L4, L5, & S1 Medial Branch Nerves)     Sedation: Patient's choice.     Timeframe: In January 2020  Order Specific Question:   Where will this procedure be performed?    Answer:   [facility]  . UDS (Compliance-13) ([facility]) ([facility]) (Established Pt.)    Volume: 30 ml(s). Minimum 3 ml of urine is needed. Document temperature of fresh sample. Indications: Long term (current) use of opiate analgesic [id number])   Follow-up plan:   Return in about 8 weeks (around 01/30/2019) for (VV), (MM), in addition, Procedure (w/ sedation): (B) L-FCT BLK #1 (in January 2020).      Interventional management considerations: Considering:   NOTE: Currently awaiting to have a right total hip replacement by Dr. Mardelle Matte. Possible bilateral suprascapular nerve RFA  Diagnostic right CESI  Diagnostic bilateral cervical facet block  Possible bilateral cervical facet RFA  Diagnostic bilateral lumbar facet blocks  Possible bilateral lumbar facet RFA  Diagnostic right LESI  Diagnostic right-sided versus bilateral TFESI  Diagnostic bilateral SI joint block  Possible bilateral SI joint RFA  Diagnostic right-sided genicular NB  Possible right-sided genicular nerve RFA    PRN Procedures:   Palliative right IA hip joint injection #3  Palliative bilateral suprascapular NB #3  Palliative bilateral suprascapular RFA #2 (last done 10/18/2018 Palliative bilateral IA shoulder joint  injection #2     Recent Visits Date Type Provider Dept  12/03/18 Telemedicine Milinda Pointer, MD [facility]  10/18/18 Procedure visit Milinda Pointer, MD [facility]  09/26/18 Office Visit Milinda Pointer, MD [facility]  09/06/18 Procedure visit Milinda Pointer, MD [facility]  Showing recent visits within past 90 days and meeting all other requirements   Future Appointments Date Type Provider Dept  01/28/19 Appointment Milinda Pointer, MD [facility]  Showing future appointments within next 90 days and meeting all other requirements   I discussed the assessment and treatment plan with the patient. The patient was provided an opportunity to ask questions and all were answered. The patient agreed with the plan and demonstrated an understanding of the instructions.  Patient advised to call back or seek an in-person evaluation if the symptoms or condition worsens.  Total duration of non-face-to-face encounter: 15 minutes.  Note by: Gaspar Cola, MD Date: 12/05/2018; Time: 12:59 PM  Note: This dictation was prepared with Dragon dictation. Any transcriptional errors that may result from this process are unintentional.  Disclaimer:  * Given the special circumstances of the COVID-19 pandemic, the federal government has announced that the Office for Civil Rights (OCR) will exercise its enforcement discretion and will not impose penalties on physicians using telehealth in the event of noncompliance with regulatory requirements under the [place] (HIPAA) in connection with the good faith provision of telehealth during the XX123456 national public health emergency. ([place])

## 2018-12-04 NOTE — Progress Notes (Signed)
Pain relief after procedure (treated area only): (Questions asked to patient) 1. Starting about 15 minutes after the procedure, and "while the area was still numb" (from the local anesthetics), were you having any of your usual pain "in that area"? (NOT including the discomfort from the needle sticks.) First 1 hour: 100 % better. First 4-6 hours: 20 % better. 2. Assuming that it did get numb. How long was the area numb? 0 % benefit, longer than 6 hours. How long? none. 3. How much better is your pain now, when compared to before the procedure? Current benefit: 0 % better. 4. Can you move better now? Improvement in ROM (Range of Motion): No. 5. Can you do more now? Improvement in function: No. 4. Did you have any problems with the procedure? Side-effects/Complications: No.

## 2018-12-05 ENCOUNTER — Ambulatory Visit: Payer: Medicare Other | Attending: Pain Medicine | Admitting: Pain Medicine

## 2018-12-05 ENCOUNTER — Other Ambulatory Visit: Payer: Self-pay

## 2018-12-05 ENCOUNTER — Telehealth: Payer: Self-pay | Admitting: *Deleted

## 2018-12-05 ENCOUNTER — Other Ambulatory Visit: Payer: Self-pay | Admitting: Pain Medicine

## 2018-12-05 DIAGNOSIS — G894 Chronic pain syndrome: Secondary | ICD-10-CM

## 2018-12-05 DIAGNOSIS — Z8619 Personal history of other infectious and parasitic diseases: Secondary | ICD-10-CM | POA: Insufficient documentation

## 2018-12-05 DIAGNOSIS — M545 Low back pain: Secondary | ICD-10-CM

## 2018-12-05 DIAGNOSIS — Z79899 Other long term (current) drug therapy: Secondary | ICD-10-CM

## 2018-12-05 DIAGNOSIS — M25551 Pain in right hip: Secondary | ICD-10-CM

## 2018-12-05 DIAGNOSIS — M7918 Myalgia, other site: Secondary | ICD-10-CM

## 2018-12-05 DIAGNOSIS — M25511 Pain in right shoulder: Secondary | ICD-10-CM

## 2018-12-05 DIAGNOSIS — M47816 Spondylosis without myelopathy or radiculopathy, lumbar region: Secondary | ICD-10-CM

## 2018-12-05 DIAGNOSIS — G8929 Other chronic pain: Secondary | ICD-10-CM

## 2018-12-05 DIAGNOSIS — M25561 Pain in right knee: Secondary | ICD-10-CM

## 2018-12-05 DIAGNOSIS — M25512 Pain in left shoulder: Secondary | ICD-10-CM

## 2018-12-05 DIAGNOSIS — M792 Neuralgia and neuritis, unspecified: Secondary | ICD-10-CM

## 2018-12-05 DIAGNOSIS — Z8616 Personal history of COVID-19: Secondary | ICD-10-CM | POA: Insufficient documentation

## 2018-12-05 MED ORDER — OXYCODONE HCL 5 MG PO TABS: 5.0000 mg | ORAL_TABLET | Freq: Three times a day (TID) | ORAL | 0 refills | Status: DC | PRN

## 2018-12-05 NOTE — Patient Instructions (Signed)

## 2018-12-05 NOTE — Telephone Encounter (Signed)
Returned patient's call. Another person in the home answered, patient is not available. I told her we will call her again later.

## 2018-12-06 NOTE — Telephone Encounter (Signed)
Attempted to call again. Mailbox is full.

## 2018-12-10 NOTE — Telephone Encounter (Signed)
Questions answered.

## 2019-01-08 ENCOUNTER — Ambulatory Visit: Payer: Medicare Other | Admitting: Pain Medicine

## 2019-01-22 ENCOUNTER — Ambulatory Visit: Payer: Medicare Other | Admitting: Pain Medicine

## 2019-01-23 ENCOUNTER — Encounter: Payer: Self-pay | Admitting: Pain Medicine

## 2019-01-24 ENCOUNTER — Other Ambulatory Visit: Payer: Self-pay

## 2019-01-24 ENCOUNTER — Ambulatory Visit (HOSPITAL_BASED_OUTPATIENT_CLINIC_OR_DEPARTMENT_OTHER): Payer: Medicare Other | Admitting: Pain Medicine

## 2019-01-24 ENCOUNTER — Encounter: Payer: Self-pay | Admitting: Pain Medicine

## 2019-01-24 ENCOUNTER — Ambulatory Visit
Admission: RE | Admit: 2019-01-24 | Discharge: 2019-01-24 | Disposition: A | Discharge status: Home / Self Care | Payer: Medicare Other | Source: Ambulatory Visit | Attending: Pain Medicine | Admitting: Pain Medicine

## 2019-01-24 VITALS — BP 148/94 | HR 90 | Temp 98.0°F | Resp 16 | Ht 68.0 in | Wt 172.0 lb

## 2019-01-24 DIAGNOSIS — M19011 Primary osteoarthritis, right shoulder: Secondary | ICD-10-CM | POA: Insufficient documentation

## 2019-01-24 DIAGNOSIS — M25511 Pain in right shoulder: Secondary | ICD-10-CM | POA: Diagnosis present

## 2019-01-24 DIAGNOSIS — M545 Low back pain, unspecified: Secondary | ICD-10-CM

## 2019-01-24 DIAGNOSIS — M5137 Other intervertebral disc degeneration, lumbosacral region: Secondary | ICD-10-CM

## 2019-01-24 DIAGNOSIS — M47817 Spondylosis without myelopathy or radiculopathy, lumbosacral region: Secondary | ICD-10-CM | POA: Diagnosis present

## 2019-01-24 DIAGNOSIS — M4316 Spondylolisthesis, lumbar region: Secondary | ICD-10-CM | POA: Insufficient documentation

## 2019-01-24 DIAGNOSIS — M47816 Spondylosis without myelopathy or radiculopathy, lumbar region: Secondary | ICD-10-CM | POA: Insufficient documentation

## 2019-01-24 DIAGNOSIS — M19012 Primary osteoarthritis, left shoulder: Secondary | ICD-10-CM | POA: Insufficient documentation

## 2019-01-24 DIAGNOSIS — M25512 Pain in left shoulder: Secondary | ICD-10-CM | POA: Diagnosis present

## 2019-01-24 DIAGNOSIS — G8929 Other chronic pain: Secondary | ICD-10-CM | POA: Insufficient documentation

## 2019-01-24 MED ORDER — LIDOCAINE HCL 2 % IJ SOLN
20.0000 mL | Freq: Once | INTRAMUSCULAR | Status: AC
  Administered 2019-01-24: 400 mg
  Filled 2019-01-24: qty 20

## 2019-01-24 MED ORDER — ROPIVACAINE HCL 2 MG/ML IJ SOLN
18.0000 mL | Freq: Once | INTRAMUSCULAR | Status: AC
  Administered 2019-01-24: 10 mL via PERINEURAL
  Filled 2019-01-24: qty 20

## 2019-01-24 MED ORDER — LACTATED RINGERS IV SOLN
1000.0000 mL | Freq: Once | INTRAVENOUS | Status: AC
  Administered 2019-01-24: 1000 mL via INTRAVENOUS

## 2019-01-24 MED ORDER — MIDAZOLAM HCL 5 MG/5ML IJ SOLN
1.0000 mg | INTRAMUSCULAR | Status: DC | PRN
  Administered 2019-01-24: 2 mg via INTRAVENOUS
  Filled 2019-01-24: qty 5

## 2019-01-24 MED ORDER — TRIAMCINOLONE ACETONIDE 40 MG/ML IJ SUSP
80.0000 mg | Freq: Once | INTRAMUSCULAR | Status: AC
  Administered 2019-01-24: 09:00:00 40 mg
  Filled 2019-01-24: qty 2

## 2019-01-24 MED ORDER — FENTANYL CITRATE (PF) 100 MCG/2ML IJ SOLN
25.0000 ug | INTRAMUSCULAR | Status: DC | PRN
  Administered 2019-01-24: 50 ug via INTRAVENOUS
  Filled 2019-01-24: qty 2

## 2019-01-24 NOTE — Progress Notes (Signed)
PROVIDER NOTE: Information contained herein reflects review and annotations entered in association with encounter. Interpretation of such information and data should be left to medically-trained personnel. Information provided to patient can be located elsewhere in the medical record under "Patient Instructions". Document created using STT-dictation technology, any transcriptional errors that may result from process are unintentional.    Patient: Ashlee Bruce  Service Category: Procedure  Provider: Gaspar Cola, MD  DOB: 1955-10-12  DOS: 01/24/2019  Location: [facility]  MRN: [id number]  Setting: Ambulatory - outpatient  Referring Provider: Petra Kuba, MD  Type: Established Patient  Specialty: Interventional Pain Management  PCP: Petra Kuba, MD (Inactive)   Primary Reason for Visit: Interventional Pain Management Treatment. CC: Back Pain (lower)  Procedure:          Anesthesia, Analgesia, Anxiolysis:  Type: Lumbar Facet, Medial Branch Block(s) #1  Primary Purpose: Diagnostic Region: Posterolateral Lumbosacral Spine Level: L2, L3, L4, L5, & S1 Medial Branch Level(s). Injecting these levels blocks the L3-4, L4-5, and L5-S1 lumbar facet joints. Laterality: Bilateral  Type: Moderate (Conscious) Sedation combined with Local Anesthesia Indication(s): Analgesia and Anxiety Route: Intravenous (IV) IV Access: Secured Sedation: Meaningful verbal contact was maintained at all times during the procedure  Local Anesthetic: Lidocaine 1-2%  Position: Prone   Indications: 1. Lumbar facet syndrome (Bilateral) (R>L)   2. Spondylosis without myelopathy or radiculopathy, lumbosacral region   3. DDD (degenerative disc disease), lumbosacral   4. Chronic low back pain [facility] Area of Pain) (Bilateral) (R>L) w/o sciatica   5. Spondylolisthesis at L4-L5 level   6. Chronic shoulder pain (Secondary Area of Pain) (Bilateral) (R>L)   7. Osteoarthritis of shoulders  (Bilateral)    Pain Score: Pre-procedure: 8 /10 Post-procedure: 0-No pain/10   Note: The patient comes into clinic today scheduled for a diagnostic bilateral lumbar facet block under fluoroscopic guidance and IV sedation.  However, she indicates that she has been experiencing a lot of pain in the area of the right shoulder and has decreased range of motion.  Exam today confirmed the patient's complaint.  She was unable to raise the arm up.  An x-ray of the right shoulder done on 04/27/2018 indicates that she has advanced arthritis of the right glenohumeral joint.  We have already done one intra-articular joint injection, bilaterally, as well as two diagnostic suprascapular nerve blocks and one bilateral suprascapular nerve radiofrequency ablation on October 2020.  Follow-up evaluation in December confirmed that the patient was enjoying approximately 70% improvement in both shoulders.  I will go ahead and schedule her to return for an intra-articular shoulder joint injection in approximately 2 weeks.  Pre-op Assessment:  Ashlee Bruce is a 64 y.o. (year old), female patient, seen today for interventional treatment. She  has a past surgical history that includes Cholecystectomy; Breast surgery; Knee Arthroplasty; Thyroid surgery; Ectopic pregnancy surgery; Wrist surgery; Anterior cervical decomp/discectomy fusion (N/A, 07/26/2012); Joint replacement (Right, 2007); and Knee arthroscopy. Ashlee Bruce has a current medication list which includes the following prescription(s): acyclovir, chlorthalidone, cyclobenzaprine, docusate sodium, duloxetine, gabapentin, glucose blood, ibuprofen, jardiance, lovastatin, metformin, oxycodone, potassium chloride, quetiapine, and oxycodone, and the following Facility-Administered Medications: fentanyl and midazolam. Her primarily concern today is the Back Pain (lower)  Initial Vital Signs:  Pulse/HCG Rate: 90ECG Heart Rate: 88 Temp: 97.9 F (36.6 C) Resp: 18 BP: (!) 130/91 SpO2:  99 %  BMI: Estimated body mass index is 26.15 kg/m as calculated from the following:   Height as of this  encounter: 5\' 8"  (1.727 m).   Weight as of this encounter: 172 lb (78 kg).  Risk Assessment: Allergies: Reviewed. She is allergic to raloxifene.  Allergy Precautions: None required Coagulopathies: Reviewed. None identified.  Blood-thinner therapy: None at this time Active Infection(s): Reviewed. None identified. Ashlee Bruce is afebrile  Site Confirmation: Ms. Marcy was asked to confirm the procedure and laterality before marking the site Procedure checklist: Completed Consent: Before the procedure and under the influence of no sedative(s), amnesic(s), or anxiolytics, the patient was informed of the treatment options, risks and possible complications. To fulfill our ethical and legal obligations, as recommended by the American Medical Association's Code of Ethics, I have informed the patient of my clinical impression; the nature and purpose of the treatment or procedure; the risks, benefits, and possible complications of the intervention; the alternatives, including doing nothing; the risk(s) and benefit(s) of the alternative treatment(s) or procedure(s); and the risk(s) and benefit(s) of doing nothing. The patient was provided information about the general risks and possible complications associated with the procedure. These may include, but are not limited to: failure to achieve desired goals, infection, bleeding, organ or nerve damage, allergic reactions, paralysis, and death. In addition, the patient was informed of those risks and complications associated to Spine-related procedures, such as failure to decrease pain; infection (i.e.: Meningitis, epidural or intraspinal abscess); bleeding (i.e.: epidural hematoma, subarachnoid hemorrhage, or any other type of intraspinal or peri-dural bleeding); organ or nerve damage (i.e.: Any type of peripheral nerve, nerve root, or spinal cord injury) with  subsequent damage to sensory, motor, and/or autonomic systems, resulting in permanent pain, numbness, and/or weakness of one or several areas of the body; allergic reactions; (i.e.: anaphylactic reaction); and/or death. Furthermore, the patient was informed of those risks and complications associated with the medications. These include, but are not limited to: allergic reactions (i.e.: anaphylactic or anaphylactoid reaction(s)); adrenal axis suppression; blood sugar elevation that in diabetics may result in ketoacidosis or comma; water retention that in patients with history of congestive heart failure may result in shortness of breath, pulmonary edema, and decompensation with resultant heart failure; weight gain; swelling or edema; medication-induced neural toxicity; particulate matter embolism and blood vessel occlusion with resultant organ, and/or nervous system infarction; and/or aseptic necrosis of one or more joints. Finally, the patient was informed that Medicine is not an exact science; therefore, there is also the possibility of unforeseen or unpredictable risks and/or possible complications that may result in a catastrophic outcome. The patient indicated having understood very clearly. We have given the patient no guarantees and we have made no promises. Enough time was given to the patient to ask questions, all of which were answered to the patient's satisfaction. Ms. Blackwater has indicated that she wanted to continue with the procedure. Attestation: I, the ordering provider, attest that I have discussed with the patient the benefits, risks, side-effects, alternatives, likelihood of achieving goals, and potential problems during recovery for the procedure that I have provided informed consent. Date  Time: 01/24/2019  8:35 AM  Pre-Procedure Preparation:  Monitoring: As per clinic protocol. Respiration, ETCO2, SpO2, BP, heart rate and rhythm monitor placed and checked for adequate function Safety  Precautions: Patient was assessed for positional comfort and pressure points before starting the procedure. Time-out: I initiated and conducted the "Time-out" before starting the procedure, as per protocol. The patient was asked to participate by confirming the accuracy of the "Time Out" information. Verification of the correct person, site, and procedure were performed and  confirmed by me, the nursing staff, and the patient. "Time-out" conducted as per Joint Commission's Universal Protocol (UP.01.01.01). Time: 0911  Description of Procedure:          Laterality: Bilateral. The procedure was performed in identical fashion on both sides. Levels:  L2, L3, L4, L5, & S1 Medial Branch Level(s) Area Prepped: Posterior Lumbosacral Region Prepping solution: DuraPrep (Iodine Povacrylex [0.7% available iodine] and Isopropyl Alcohol, 74% w/w) Safety Precautions: Aspiration looking for blood return was conducted prior to all injections. At no point did we inject any substances, as a needle was being advanced. Before injecting, the patient was told to immediately notify me if she was experiencing any new onset of "ringing in the ears, or metallic taste in the mouth". No attempts were made at seeking any paresthesias. Safe injection practices and needle disposal techniques used. Medications properly checked for expiration dates. SDV (single dose vial) medications used. After the completion of the procedure, all disposable equipment used was discarded in the proper designated medical waste containers. Local Anesthesia: Protocol guidelines were followed. The patient was positioned over the fluoroscopy table. The area was prepped in the usual manner. The time-out was completed. The target area was identified using fluoroscopy. A 12-in long, straight, sterile hemostat was used with fluoroscopic guidance to locate the targets for each level blocked. Once located, the skin was marked with an approved surgical skin marker.  Once all sites were marked, the skin (epidermis, dermis, and hypodermis), as well as deeper tissues (fat, connective tissue and muscle) were infiltrated with a small amount of a short-acting local anesthetic, loaded on a 10cc syringe with a 25G, 1.5-in  Needle. An appropriate amount of time was allowed for local anesthetics to take effect before proceeding to the next step. Local Anesthetic: Lidocaine 2.0% The unused portion of the local anesthetic was discarded in the proper designated containers. Technical explanation of process:  L2 Medial Branch Nerve Block (MBB): The target area for the L2 medial branch is at the junction of the postero-lateral aspect of the superior articular process and the superior, posterior, and medial edge of the transverse process of L3. Under fluoroscopic guidance, a Quincke needle was inserted until contact was made with os over the superior postero-lateral aspect of the pedicular shadow (target area). After negative aspiration for blood, 0.5 mL of the nerve block solution was injected without difficulty or complication. The needle was removed intact. L3 Medial Branch Nerve Block (MBB): The target area for the L3 medial branch is at the junction of the postero-lateral aspect of the superior articular process and the superior, posterior, and medial edge of the transverse process of L4. Under fluoroscopic guidance, a Quincke needle was inserted until contact was made with os over the superior postero-lateral aspect of the pedicular shadow (target area). After negative aspiration for blood, 0.5 mL of the nerve block solution was injected without difficulty or complication. The needle was removed intact. L4 Medial Branch Nerve Block (MBB): The target area for the L4 medial branch is at the junction of the postero-lateral aspect of the superior articular process and the superior, posterior, and medial edge of the transverse process of L5. Under fluoroscopic guidance, a Quincke needle  was inserted until contact was made with os over the superior postero-lateral aspect of the pedicular shadow (target area). After negative aspiration for blood, 0.5 mL of the nerve block solution was injected without difficulty or complication. The needle was removed intact. L5 Medial Branch Nerve Block (MBB): The target area  for the L5 medial branch is at the junction of the postero-lateral aspect of the superior articular process and the superior, posterior, and medial edge of the sacral ala. Under fluoroscopic guidance, a Quincke needle was inserted until contact was made with os over the superior postero-lateral aspect of the pedicular shadow (target area). After negative aspiration for blood, 0.5 mL of the nerve block solution was injected without difficulty or complication. The needle was removed intact. S1 Medial Branch Nerve Block (MBB): The target area for the S1 medial branch is at the posterior and inferior 6 o'clock position of the L5-S1 facet joint. Under fluoroscopic guidance, the Quincke needle inserted for the L5 MBB was redirected until contact was made with os over the inferior and postero aspect of the sacrum, at the 6 o' clock position under the L5-S1 facet joint (Target area). After negative aspiration for blood, 0.5 mL of the nerve block solution was injected without difficulty or complication. The needle was removed intact.  Nerve block solution: 0.2% PF-Ropivacaine + Triamcinolone (40 mg/mL) diluted to a final concentration of 4 mg of Triamcinolone/mL of Ropivacaine The unused portion of the solution was discarded in the proper designated containers. Procedural Needles: 22-gauge, 3.5-inch, Quincke needles used for all levels. Note: Unfortunately the patient was extremely uncooperative and noncompliant during the procedure and would not stop moving, therefore making it extremely hard to accomplish the procedure.  However, despite this, eventually we were able to complete it.  Once the  entire procedure was completed, the treated area was cleaned, making sure to leave some of the prepping solution back to take advantage of its long term bactericidal properties.   Illustration of the posterior view of the lumbar spine and the posterior neural structures. Laminae of L2 through S1 are labeled. DPRL5, dorsal primary ramus of L5; DPRS1, dorsal primary ramus of S1; DPR3, dorsal primary ramus of L3; FJ, facet (zygapophyseal) joint L3-L4; I, inferior articular process of L4; LB1, lateral branch of dorsal primary ramus of L1; IAB, inferior articular branches from L3 medial branch (supplies L4-L5 facet joint); IBP, intermediate branch plexus; MB3, medial branch of dorsal primary ramus of L3; NR3, third lumbar nerve root; S, superior articular process of L5; SAB, superior articular branches from L4 (supplies L4-5 facet joint also); TP3, transverse process of L3.  Vitals:   01/24/19 0915 01/24/19 0926 01/24/19 0936 01/24/19 0946  BP: (!) 132/98 130/83 125/86 (!) 148/94  Pulse:      Resp: 14 14 16 16   Temp:  (!) 97.2 F (36.2 C)  98 F (36.7 C)  TempSrc:      SpO2: 100% 95% 95% 96%  Weight:      Height:         Start Time: 0911 hrs. End Time: 0919 hrs.  Imaging Guidance (Spinal):          Type of Imaging Technique: Fluoroscopy Guidance (Spinal) Indication(s): Assistance in needle guidance and placement for procedures requiring needle placement in or near specific anatomical locations not easily accessible without such assistance. Exposure Time: Please see nurses notes. Contrast: None used. Fluoroscopic Guidance: I was personally present during the use of fluoroscopy. "Tunnel Vision Technique" used to obtain the best possible view of the target area. Parallax error corrected before commencing the procedure. "Direction-depth-direction" technique used to introduce the needle under continuous pulsed fluoroscopy. Once target was reached, antero-posterior, oblique, and lateral fluoroscopic  projection used confirm needle placement in all planes. Images permanently stored in EMR. Interpretation: No contrast injected.  I personally interpreted the imaging intraoperatively. Adequate needle placement confirmed in multiple planes. Permanent images saved into the patient's record.  Antibiotic Prophylaxis:   Anti-infectives (From admission, onward)   None     Indication(s): None identified  Post-operative Assessment:  Post-procedure Vital Signs:  Pulse/HCG Rate: 9083 Temp: 98 F (36.7 C) Resp: 16 BP: (!) 148/94 SpO2: 96 %  EBL: None  Complications: No immediate post-treatment complications observed by team, or reported by patient.  Note: The patient tolerated the entire procedure well. A repeat set of vitals were taken after the procedure and the patient was kept under observation following institutional policy, for this type of procedure. Post-procedural neurological assessment was performed, showing return to baseline, prior to discharge. The patient was provided with post-procedure discharge instructions, including a section on how to identify potential problems. Should any problems arise concerning this procedure, the patient was given instructions to immediately contact us, at any time, without hesitation. In any case, we plan to contact the patient by telephone for a follow-up status report regarding this interventional procedure.  Comments:  No additional relevant information.  Plan of Care  Orders:  Orders Placed This Encounter  Procedures  . LUMBAR FACET(MEDIAL BRANCH NERVE BLOCK) MBNB    Scheduling Instructions:     Procedure: Lumbar facet block (AKA.: Lumbosacral medial branch nerve block)     Side: Bilateral     Level: L3-4, L4-5, & L5-S1 Facets (L2, L3, L4, L5, & S1 Medial Branch Nerves)     Sedation: Patient's choice.     Timeframe: Today    Order Specific Question:   Where will this procedure be performed?    Answer:   [facility]  . SHOULDER  INJECTION    Standing Status:   Future    Standing Expiration Date:   02/23/2019    Scheduling Instructions:     Side: Right-sided     Sedation: With Sedation.     Timeframe: In 2 weeks from now    Order Specific Question:   Where will this procedure be performed?    Answer:   [facility]    Comments:   by Dr. Dossie Arbour  . [facility] C-ARM 1-60 MIN NO REPORT    Intraoperative interpretation by procedural physician at [place].    Standing Status:   Standing    Number of Occurrences:   1    Order Specific Question:   Reason for exam:    Answer:   Assistance in needle guidance and placement for procedures requiring needle placement in or near specific anatomical locations not easily accessible without such assistance.  . Informed Consent Details: Physician/Practitioner Attestation; Transcribe to consent form and obtain patient signature    Nursing Order: Transcribe to consent form and obtain patient signature. Note: Always confirm laterality of pain with Ms. Lojewski, before procedure. Procedure: Lumbar Facet Block  under fluoroscopic guidance Indication/Reason: Low Back Pain, with our without leg pain, due to Facet Joint Arthralgia (Joint Pain) known as Lumbar Facet Syndrome, secondary to Lumbar, and/or Lumbosacral Spondylosis (Arthritis of the Spine), without myelopathy or radiculopathy (Nerve Damage). Provider Attestation: I, [place] Dossie Arbour, MD, (Pain Management Specialist), the physician/practitioner, attest that I have discussed with the patient the benefits, risks, side effects, alternatives, likelihood of achieving goals and potential problems during recovery for the procedure that I have provided informed consent.  . Provide equipment / supplies at bedside    Equipment required: Single use, disposable, "Block Tray"  Standing Status:   Standing    Number of Occurrences:   1    Order Specific Question:   Specify    Answer:   Block Tray   Chronic Opioid  Analgesic:  Oxycodone IR 5 mg every 8 hours (15 mg/day of oxycodone). High-Risk for SUD. Prior hx. of Cocaine dependence & Schizoaffective disorder. MME/day: 22.5 mg/day.   Medications ordered for procedure: Meds ordered this encounter  Medications  . lidocaine (XYLOCAINE) 2 % (with pres) injection 400 mg  . lactated ringers infusion 1,000 mL  . midazolam (VERSED) 5 MG/5ML injection 1-2 mg    Make sure Flumazenil is available in the pyxis when using this medication. If oversedation occurs, administer 0.2 mg IV over 15 sec. If after 45 sec no response, administer 0.2 mg again over 1 min; may repeat at 1 min intervals; not to exceed 4 doses (1 mg)  . fentaNYL (SUBLIMAZE) injection 25-50 mcg    Make sure Narcan is available in the pyxis when using this medication. In the event of respiratory depression (RR< 8/min): Titrate NARCAN (naloxone) in increments of 0.1 to 0.2 mg IV at 2-3 minute intervals, until desired degree of reversal.  . ropivacaine (PF) 2 mg/mL (0.2%) (NAROPIN) injection 18 mL  . triamcinolone acetonide (KENALOG-40) injection 80 mg   Medications administered: We administered lidocaine, lactated ringers, midazolam, fentaNYL, ropivacaine (PF) 2 mg/mL (0.2%), and triamcinolone acetonide.  See the medical record for exact dosing, route, and time of administration.  Follow-up plan:   Return in about 2 weeks (around 02/07/2019) for (PP), in addition, Procedure (w/ sedation): (R) Shoulder inj #2.       Interventional management considerations: Considering:   NOTE: Currently awaiting to have a right total hip replacement by Dr. Mardelle Matte. Possible bilateral suprascapular nerve RFA  Diagnostic right CESI  Diagnostic bilateral cervical facet block  Possible bilateral cervical facet RFA  Diagnostic bilateral lumbar facet blocks  Possible bilateral lumbar facet RFA  Diagnostic right LESI  Diagnostic right-sided versus bilateral TFESI  Diagnostic bilateral SI joint block  Possible  bilateral SI joint RFA  Diagnostic right-sided genicular NB  Possible right-sided genicular nerve RFA    PRN Procedures:   Palliative right IA hip joint injection #3  Palliative bilateral suprascapular NB #3  Palliative bilateral suprascapular RFA #2 (last done 10/18/2018 Palliative bilateral IA shoulder joint injection #2     Recent Visits No visits were found meeting these conditions.  Showing recent visits within past 90 days and meeting all other requirements   Today's Visits Date Type Provider Dept  01/24/19 Procedure visit Milinda Pointer, MD [facility]  Showing today's visits and meeting all other requirements   Future Appointments Date Type Provider Dept  01/28/19 Telemedicine Milinda Pointer, [facility]  02/07/19 Appointment Milinda Pointer, MD [facility]  Showing future appointments within next 90 days and meeting all other requirements   Disposition: Discharge home  Discharge (Date  Time): 01/24/2019; 0947 hrs.   Primary Care Physician: Petra Kuba, MD (Inactive) Location: [facility] Note by: Gaspar Cola, MD Date: 01/24/2019; Time: 10:02 AM  Disclaimer:  Medicine is not an Chief Strategy Officer. The only guarantee in medicine is that nothing is guaranteed. It is important to note that the decision to proceed with this intervention was based on the information collected from the patient. The Data and conclusions were drawn from the patient's questionnaire, the interview, and the physical examination. Because the information was provided in  large part by the patient, it cannot be guaranteed that it has not been purposely or unconsciously manipulated. Every effort has been made to obtain as much relevant data as possible for this evaluation. It is important to note that the conclusions that lead to this procedure are derived in large part from the available data. Always take into account that  the treatment will also be dependent on availability of resources and existing treatment guidelines, considered by other Pain Management Practitioners as being common knowledge and practice, at the time of the intervention. For Medico-Legal purposes, it is also important to point out that variation in procedural techniques and pharmacological choices are the acceptable norm. The indications, contraindications, technique, and results of the above procedure should only be interpreted and judged by a Board-Certified Interventional Pain Specialist with extensive familiarity and expertise in the same exact procedure and technique.

## 2019-01-24 NOTE — Patient Instructions (Signed)

## 2019-01-24 NOTE — Progress Notes (Signed)
Patient: Ashlee Bruce  Service Category: E/M  Provider: Gaspar Cola, MD  DOB: Aug 01, 1955  DOS: 01/28/2019  Location: Office  MRN: [id number]  Setting: Ambulatory outpatient  Referring Provider: Petra Kuba, MD  Type: Established Patient  Specialty: Interventional Pain Management  PCP: Petra Kuba, MD (Inactive)  Location: Remote location  Delivery: TeleHealth     Virtual Encounter - Pain Management PROVIDER NOTE: Information contained herein reflects review and annotations entered in association with encounter. Interpretation of such information and data should be left to medically-trained personnel. Information provided to patient can be located elsewhere in the medical record under "Patient Instructions". Document created using STT-dictation technology, any transcriptional errors that may result from process are unintentional.    Contact & Pharmacy Preferred: [phone] Home: [phone] (home) Mobile: [phone] (mobile) E-mail: [email or web address]  [place], [place] [address] [facility] [place] [address] Phone: [phone] Fax: [phone]   Pre-screening  Ashlee Bruce offered "in-person" vs "virtual" encounter. She indicated preferring virtual for this encounter.   Reason COVID-19*  Social distancing based on CDC and AMA recommendations.   I contacted Ashlee Bruce on 01/28/2019 via telephone.      I clearly identified myself as Gaspar Cola, MD. I verified that I was speaking with the correct person using two identifiers (Name: Ashlee Bruce, and date of birth: [birth year]).  Consent I sought verbal advanced consent from Ashlee Bruce for virtual visit interactions. I informed Ashlee Bruce of possible security and privacy concerns, risks, and limitations associated with providing "not-in-person" medical evaluation and management services. I also informed Ashlee Bruce of the  availability of "in-person" appointments. Finally, I informed her that there would be a charge for the virtual visit and that she could be  personally, fully or partially, financially responsible for it. Ashlee Bruce expressed understanding and agreed to proceed.   Historic Elements   Ashlee Bruce is a 64 y.o. year old, female patient evaluated today after her last encounter by our practice on 01/24/2019. Ashlee Bruce  has a past medical history of Bipolar disorder ([place]), Depression, Diabetes mellitus without complication ([place]), GERD (gastroesophageal reflux disease), Hypertension, Shortness of breath, and Transfusion history. She also  has a past surgical history that includes Cholecystectomy; Breast surgery; Knee Arthroplasty; Thyroid surgery; Ectopic pregnancy surgery; Wrist surgery; Anterior cervical decomp/discectomy fusion (N/A, 07/26/2012); Joint replacement (Right, 2007); and Knee arthroscopy. Ashlee Bruce has a current medication list which includes the following prescription(s): acyclovir, chlorthalidone, cyclobenzaprine, docusate sodium, duloxetine, gabapentin, glucose blood, ibuprofen, jardiance, lovastatin, metformin, [START ON 02/03/2019] oxycodone, potassium chloride, and quetiapine. She  reports that she has never smoked. She has never used smokeless tobacco. She reports current alcohol use of about 1.0 standard drinks of alcohol per week. She reports that she does not use drugs. Ashlee Bruce is allergic to raloxifene.   HPI  Today, she is being contacted for medication management.  The patient indicates doing well with the current medication regimen. No adverse reactions or side effects reported to the medications.  The patient's last UDS was on 10/18/2018 and that one came back positive for cocaine metabolites.  The patient has been informed that should this happen again, we will permanently discontinue the controlled substances.  She also wants to come in to have both shoulders injected.  She has  an appointment for that on 02/07/2019.  Pharmacotherapy Assessment  Analgesic: Oxycodone IR 5 mg every 8 hours (  15 mg/day of oxycodone). High-Risk for SUD. Prior hx. of Cocaine dependence & Schizoaffective disorder. MME/day: 22.5 mg/day.   Monitoring: Pharmacotherapy: No side-effects or adverse reactions reported. [place] PMP: PDMP reviewed during this encounter.       Compliance: No problems identified. Effectiveness: Clinically acceptable. Plan: Refer to "POC".  UDS:  Summary  Date Value Ref Range Status  10/18/2018 Note  Final    Comment:    ==================================================================== [employer] Select 13 (MW) ==================================================================== Test                             Result       Flag       Units Drug Present not Declared for Prescription Verification   Cocaine                        62           UNEXPECTED ng/mg creat   Benzoylecgonine                >4808        UNEXPECTED ng/mg creat    Source of cocaine is most commonly illicit, but cocaine is present    in some topical anesthetic solutions. Benzoylecgonine is an expected    metabolite of cocaine. Drug Absent but Declared for Prescription Verification   Hydrocodone                    Not Detected UNEXPECTED ng/mg creat   Oxycodone                      Not Detected UNEXPECTED ng/mg creat ==================================================================== Test                      Result    Flag   Units      Ref Range   Creatinine              104              mg/dL      >=20 ==================================================================== Declared Medications:  The flagging and interpretation on this report are based on the  following declared medications.  Unexpected results may arise from  inaccuracies in the declared medications.  **Note: The testing scope of this panel includes these medications:  Hydrocodone  Oxycodone  **Note: The testing scope of  this panel does not include the  following reported medications:  Acetaminophen  Acyclovir  Chlorthalidone  Cyclobenzaprine  Docusate  Duloxetine  Empagliflozin (Jardiance)  Gabapentin  Ibuprofen  Lovastatin  Metformin  Potassium ([place])  Quetiapine ==================================================================== For clinical consultation, please call [phone]. ====================================================================    Laboratory Chemistry Profile (12 mo)  Renal: 04/27/2018: BUN 21; Creatinine, Ser 0.92  Lab Results  Component Value Date   GFRAA >60 04/27/2018   GFRNONAA >60 04/27/2018   Hepatic: 04/27/2018: Albumin 4.0 Lab Results  Component Value Date   AST 12 (L) 04/27/2018   ALT 14 04/27/2018   Other: 04/27/2018: 25-Hydroxy, Vitamin D 36; 25-Hydroxy, Vitamin D-2 <1.0; 25-Hydroxy, Vitamin D-3 36; CRP <0.8; Sed Rate 19; Vitamin B-12 371  Note: Above Lab results reviewed.  Imaging  DG PAIN CLINIC C-ARM 1-60 MIN NO REPORT Fluoro was used, but no Radiologist interpretation will be provided.  Please refer to "NOTES" tab for provider progress note.   Assessment  The primary encounter diagnosis was Chronic shoulder pain (Secondary  Area of Pain) (Bilateral) (R>L). Diagnoses of Osteoarthritis of glenohumeral joint (Left), Osteoarthritis of glenohumeral joint (Right), Osteoarthritis of shoulders (Bilateral), Chronic pain syndrome, Pharmacologic therapy, History of substance use disorder, and Cocaine dependence, in remission [facility]) were also pertinent to this visit.  Plan of Care  Problem-specific:  No problem-specific Assessment & Plan notes found for this encounter.  I am having Ashlee Bruce. Ashlee Bruce maintain her docusate sodium, glucose blood, ibuprofen, lovastatin, QUEtiapine, metFORMIN, Jardiance, DULoxetine, acyclovir, potassium chloride, chlorthalidone, cyclobenzaprine, gabapentin, and oxyCODONE.  Pharmacotherapy (Medications Ordered): Meds  ordered this encounter  Medications  . oxyCODONE (OXY IR/ROXICODONE) 5 MG immediate release tablet    Sig: Take 1 tablet (5 mg total) by mouth every 8 (eight) hours as needed for severe pain (Not to be used daily.). Must last minimum: 30 days. Max: 3/day. Do not mix w/ alcohol or sedatives, may cause death.    Dispense:  90 tablet    Refill:  0    Chronic Pain: STOP Act (Not applicable) Fill 1 day early if closed on refill date. Do not fill until: 02/03/2019. To last until: 03/05/2019. Avoid benzodiazepines within 8 hours of opioids   Orders:  Orders Placed This Encounter  Procedures  . SHOULDER INJECTION    Standing Status:   Future    Standing Expiration Date:   02/27/2019    Scheduling Instructions:     Side: Bilateral     Sedation: With Sedation.     Timeframe: 02/07/2019    Order Specific Question:   Where will this procedure be performed?    Answer:   [facility]    Comments:   by Dr. Dossie Arbour  . Tox[employer]SU[employer] Select 13 (MW), Urine    Volume: 30 ml(s). Minimum 3 ml of urine is needed. Document temperature of fresh sample. Indications: Long term (current) use of opiate analgesic [id number])   Follow-up plan:   Return in about 30 days (around 02/27/2019) for (VV), (MM) to check on UDS ordered today.      Interventional management considerations: Considering:   NOTE: Currently awaiting to have a right total hip replacement by Dr. Mardelle Matte. Diagnostic right CESI  Diagnostic bilateral cervical facet block  Possible bilateral cervical facet RFA  Diagnostic bilateral lumbar facet blocks  Possible bilateral lumbar facet RFA  Diagnostic right LESI  Diagnostic right vs bilateral TFESI  Diagnostic bilateral SI joint block  Possible bilateral SI joint RFA  Diagnostic right genicular NB  Possible right genicular nerve RFA    PRN Procedures:   Palliative right IA hip joint injection #3  Palliative bilateral suprascapular NB #3  Palliative bilateral suprascapular RFA #2 (last done  10/18/2018) Palliative bilateral IA shoulder joint injection #2     Recent Visits Date Type Provider Dept  01/24/19 Procedure visit Milinda Pointer, MD [facility]  Showing recent visits within past 90 days and meeting all other requirements   Today's Visits Date Type Provider Dept  01/28/19 Telemedicine Milinda Pointer, MD [facility]  Showing today's visits and meeting all other requirements   Future Appointments Date Type Provider Dept  02/07/19 Appointment Milinda Pointer, MD [facility]  02/27/19 Appointment Milinda Pointer, MD [facility]  Showing future appointments within next 90 days and meeting all other requirements   I discussed the assessment and treatment plan with the patient. The patient was provided an opportunity to ask questions and all were answered. The patient agreed with the plan and demonstrated an understanding of the instructions.  Patient  advised to call back or seek an in-person evaluation if the symptoms or condition worsens.  Duration of encounter: 12 minutes.  Note by: Gaspar Cola, MD Date: 01/28/2019; Time: 3:23 PM

## 2019-01-24 NOTE — Progress Notes (Signed)
Safety precautions to be maintained throughout the outpatient stay will include: orient to surroundings, keep bed in low position, maintain call bell within reach at all times, provide assistance with transfer out of bed and ambulation.  

## 2019-01-25 ENCOUNTER — Telehealth: Payer: Self-pay

## 2019-01-25 NOTE — Telephone Encounter (Signed)
Post procedure phone call.  Patient states she is doing well.  

## 2019-01-28 ENCOUNTER — Other Ambulatory Visit: Payer: Self-pay

## 2019-01-28 ENCOUNTER — Ambulatory Visit: Payer: Medicare Other | Attending: Pain Medicine | Admitting: Pain Medicine

## 2019-01-28 DIAGNOSIS — M19012 Primary osteoarthritis, left shoulder: Secondary | ICD-10-CM | POA: Diagnosis not present

## 2019-01-28 DIAGNOSIS — Z87898 Personal history of other specified conditions: Secondary | ICD-10-CM

## 2019-01-28 DIAGNOSIS — F1421 Cocaine dependence, in remission: Secondary | ICD-10-CM

## 2019-01-28 DIAGNOSIS — M19011 Primary osteoarthritis, right shoulder: Secondary | ICD-10-CM | POA: Diagnosis not present

## 2019-01-28 DIAGNOSIS — Z79899 Other long term (current) drug therapy: Secondary | ICD-10-CM

## 2019-01-28 DIAGNOSIS — M25511 Pain in right shoulder: Secondary | ICD-10-CM | POA: Diagnosis not present

## 2019-01-28 DIAGNOSIS — M25512 Pain in left shoulder: Secondary | ICD-10-CM | POA: Diagnosis not present

## 2019-01-28 DIAGNOSIS — G894 Chronic pain syndrome: Secondary | ICD-10-CM

## 2019-01-28 DIAGNOSIS — G8929 Other chronic pain: Secondary | ICD-10-CM

## 2019-01-28 MED ORDER — OXYCODONE HCL 5 MG PO TABS: 5.0000 mg | ORAL_TABLET | Freq: Three times a day (TID) | ORAL | 0 refills | Status: DC | PRN

## 2019-01-28 NOTE — Patient Instructions (Addendum)
____________________________________________________________________________________________  Medication Rules  Purpose: To inform patients, and their family members, of our rules and regulations.  Applies to: All patients receiving prescriptions (written or electronic).  Pharmacy of record: Pharmacy where electronic prescriptions will be sent. If written prescriptions are taken to a different pharmacy, please inform the nursing staff. The pharmacy listed in the electronic medical record should be the one where you would like electronic prescriptions to be sent.  Electronic prescriptions: In compliance with the [place] Strengthen Opioid Misuse Prevention (STOP) Act of 2017 (Session Law 2017-74/H243), effective January 03, 2018, all controlled substances must be electronically prescribed. Calling prescriptions to the pharmacy will cease to exist.  Prescription refills: Only during scheduled appointments. Applies to all prescriptions.  NOTE: The following applies primarily to controlled substances (Opioid* Pain Medications).   Patient's responsibilities: 1. Pain Pills: Bring all pain pills to every appointment (except for procedure appointments). 2. Pill Bottles: Bring pills in original pharmacy bottle. Always bring the newest bottle. Bring bottle, even if empty. 3. Medication refills: You are responsible for knowing and keeping track of what medications you take and those you need refilled. The day before your appointment: write a list of all prescriptions that need to be refilled. The day of the appointment: give the list to the admitting nurse. Prescriptions will be written only during appointments. No prescriptions will be written on procedure days. If you forget a medication: it will not be "Called in", "Faxed", or "electronically sent". You will need to get another appointment to get these prescribed. No early refills. Do not call asking to have your prescription filled  early. 4. Prescription Accuracy: You are responsible for carefully inspecting your prescriptions before leaving our office. Have the discharge nurse carefully go over each prescription with you, before taking them home. Make sure that your name is accurately spelled, that your address is correct. Check the name and dose of your medication to make sure it is accurate. Check the number of pills, and the written instructions to make sure they are clear and accurate. Make sure that you are given enough medication to last until your next medication refill appointment. 5. Taking Medication: Take medication as prescribed. When it comes to controlled substances, taking less pills or less frequently than prescribed is permitted and encouraged. Never take more pills than instructed. Never take medication more frequently than prescribed.  6. Inform other Doctors: Always inform, all of your healthcare providers, of all the medications you take. 7. Pain Medication from other Providers: You are not allowed to accept any additional pain medication from any other Doctor or Healthcare provider. There are two exceptions to this rule. (see below) In the event that you require additional pain medication, you are responsible for notifying us, as stated below. 8. Medication Agreement: You are responsible for carefully reading and following our Medication Agreement. This must be signed before receiving any prescriptions from our practice. Safely store a copy of your signed Agreement. Violations to the Agreement will result in no further prescriptions. (Additional copies of our Medication Agreement are available upon request.) 9. Laws, Rules, & Regulations: All patients are expected to follow all Federal and State Laws, Statutes, Rules, & Regulations. Ignorance of the Laws does not constitute a valid excuse.  10. Illegal drugs and Controlled Substances: The use of illegal substances (including, but not limited to marijuana and its  derivatives) and/or the illegal use of any controlled substances is strictly prohibited. Violation of this rule may result in the immediate and   permanent discontinuation of any and all prescriptions being written by our practice. The use of any illegal substances is prohibited. 11. Adopted CDC guidelines & recommendations: Target dosing levels will be at or below 60 MME/day. Use of benzodiazepines** is not recommended.  Exceptions: There are only two exceptions to the rule of not receiving pain medications from other Healthcare Providers. 1. Exception #1 (Emergencies): In the event of an emergency (i.e.: accident requiring emergency care), you are allowed to receive additional pain medication. However, you are responsible for: As soon as you are able, call our office [phone], at any time of the day or night, and leave a message stating your name, the date and nature of the emergency, and the name and dose of the medication prescribed. In the event that your call is answered by a member of our staff, make sure to document and save the date, time, and the name of the person that took your information.  2. Exception #2 (Planned Surgery): In the event that you are scheduled by another doctor or dentist to have any type of surgery or procedure, you are allowed (for a period no longer than 30 days), to receive additional pain medication, for the acute post-op pain. However, in this case, you are responsible for picking up a copy of our "Post-op Pain Management for Surgeons" handout, and giving it to your surgeon or dentist. This document is available at our office, and does not require an appointment to obtain it. Simply go to our office during business hours (Monday-Thursday from 8:00 AM to 4:00 PM) (Friday 8:00 AM to 12:00 Noon) or if you have a scheduled appointment with [place], prior to your surgery, and ask for it by name. In addition, you will need to provide [place] with your name, name of your surgeon, type of  surgery, and date of procedure or surgery.  *Opioid medications include: morphine, codeine, oxycodone, oxymorphone, hydrocodone, hydromorphone, meperidine, tramadol, tapentadol, buprenorphine, fentanyl, methadone. **Benzodiazepine medications include: diazepam (Valium), alprazolam (Xanax), clonazepam (Klonopine), lorazepam (Ativan), clorazepate (Tranxene), chlordiazepoxide (Librium), estazolam (Prosom), oxazepam (Serax), temazepam (Restoril), triazolam (Halcion) (Last updated: 03/02/2017) ____________________________________________________________________________________________   ____________________________________________________________________________________________  Medication Recommendations and Reminders  Applies to: All patients receiving prescriptions (written and/or electronic).  Medication Rules & Regulations: These rules and regulations exist for your safety and that of others. They are not flexible and neither are we. Dismissing or ignoring them will be considered "non-compliance" with medication therapy, resulting in complete and irreversible termination of such therapy. (See document titled "Medication Rules" for more details.) In all conscience, because of safety reasons, we cannot continue providing a therapy where the patient does not follow instructions.  Pharmacy of record:   Definition: This is the pharmacy where your electronic prescriptions will be sent.   We do not endorse any particular pharmacy.  You are not restricted in your choice of pharmacy.  The pharmacy listed in the electronic medical record should be the one where you want electronic prescriptions to be sent.  If you choose to change pharmacy, simply notify our nursing staff of your choice of new pharmacy.  Recommendations:  Keep all of your pain medications in a safe place, under lock and key, even if you live alone.   After you fill your prescription, take 1 week's worth of pills and put them  away in a safe place. You should keep a separate, properly labeled bottle for this purpose. The remainder should be kept in the original bottle. Use this as your primary supply,  until it runs out. Once it's gone, then you know that you have 1 week's worth of medicine, and it is time to come in for a prescription refill. If you do this correctly, it is unlikely that you will ever run out of medicine.  To make sure that the above recommendation works, it is very important that you make sure your medication refill appointments are scheduled at least 1 week before you run out of medicine. To do this in an effective manner, make sure that you do not leave the office without scheduling your next medication management appointment. Always ask the nursing staff to show you in your prescription , when your medication will be running out. Then arrange for the receptionist to get you a return appointment, at least 7 days before you run out of medicine. Do not wait until you have 1 or 2 pills left, to come in. This is very poor planning and does not take into consideration that we may need to cancel appointments due to bad weather, sickness, or emergencies affecting our staff.  "Partial Fill": If for any reason your pharmacy does not have enough pills/tablets to completely fill or refill your prescription, do not allow for a "partial fill". You will need a separate prescription to fill the remaining amount, which we will not provide. If the reason for the partial fill is your insurance, you will need to talk to the pharmacist about payment alternatives for the remaining tablets, but again, do not accept a partial fill.  Prescription refills and/or changes in medication(s):   Prescription refills, and/or changes in dose or medication, will be conducted only during scheduled medication management appointments. (Applies to both, written and electronic prescriptions.)  No refills on procedure days. No medication will be  changed or started on procedure days. No changes, adjustments, and/or refills will be conducted on a procedure day. Doing so will interfere with the diagnostic portion of the procedure.  No phone refills. No medications will be "called into the pharmacy".  No Fax refills.  No weekend refills.  No Holliday refills.  No after hours refills.  Remember:  Business hours are:  Monday to Thursday 8:00 AM to 4:00 PM Provider's Schedule: Milinda Pointer, MD - Appointments are:  Medication management: Monday and Wednesday 8:00 AM to 4:00 PM Procedure day: Tuesday and Thursday 7:30 AM to 4:00 PM Gillis Santa, MD - Appointments are:  Medication management: Tuesday and Thursday 8:00 AM to 4:00 PM Procedure day: Monday and Wednesday 7:30 AM to 4:00 PM (Last update: 03/02/2017) ____________________________________________________________________________________________   ____________________________________________________________________________________________  CANNABIDIOL (AKA: CBD Oil or Pills)  Applies to: All patients receiving prescriptions of controlled substances (written and/or electronic).  General Information: Cannabidiol (CBD) was discovered in [birth year]. It is one of some 113 identified cannabinoids in cannabis (Marijuana) plants, accounting for up to 40% of the plant's extract. As of 2018, preliminary clinical research on cannabidiol included studies of anxiety, cognition, movement disorders, and pain.  Cannabidiol is consummed in multiple ways, including inhalation of cannabis smoke or vapor, as an aerosol spray into the cheek, and by mouth. It may be supplied as CBD oil containing CBD as the active ingredient (no added tetrahydrocannabinol (THC) or terpenes), a full-plant CBD-dominant hemp extract oil, capsules, dried cannabis, or as a liquid solution. CBD is thought not have the same psychoactivity as THC, and may affect the actions of THC. Studies suggest that CBD may interact with  different biological targets, including cannabinoid receptors and other neurotransmitter receptors. As  of 2018 the mechanism of action for its biological effects has not been determined.  In the [place], cannabidiol has a limited approval by the Food and Drug Administration (FDA) for treatment of only two types of epilepsy disorders. The side effects of long-term use of the drug include somnolence, decreased appetite, diarrhea, fatigue, malaise, weakness, sleeping problems, and others.  CBD remains a Schedule I drug prohibited for any use.  Legality: Some manufacturers ship CBD products nationally, an illegal action which the FDA has not enforced in 2018, with CBD remaining the subject of an FDA investigational new drug evaluation, and is not considered legal as a dietary supplement or food ingredient as of December 2018. Federal illegality has made it difficult historically to conduct research on CBD. CBD is openly sold in head shops and health food stores in some states where such sales have not been explicitly legalized.  Warning: Because it is not FDA approved for general use or treatment of pain, it is not required to undergo the same manufacturing controls as prescription drugs.  This means that the available cannabidiol (CBD) may be contaminated with THC.  If this is the case, it will trigger a positive urine drug screen (UDS) test for cannabinoids (Marijuana).  Because a positive UDS for illicit substances is a violation of our medication agreement, your opioid analgesics (pain medicine) may be permanently discontinued. (Last update: 03/23/2017) ____________________________________________________________________________________________

## 2019-01-30 ENCOUNTER — Telehealth: Payer: Self-pay

## 2019-01-30 NOTE — Telephone Encounter (Signed)
UDS documented in chart as done.

## 2019-01-30 NOTE — Telephone Encounter (Signed)
The patient called to let Dr. Dossie Arbour know she went and had her urine drug screen done today.

## 2019-02-01 LAB — TOXASSURE SELECT 13 (MW), URINE

## 2019-02-04 ENCOUNTER — Telehealth: Payer: Self-pay | Admitting: Pain Medicine

## 2019-02-04 NOTE — Telephone Encounter (Signed)
No prior auth reqd for shoulder injections.

## 2019-02-04 NOTE — Telephone Encounter (Signed)
Thanks

## 2019-02-04 NOTE — Telephone Encounter (Signed)
Is this something that needs to be approved? If so, Can you ask [employer], and we can ask Dr Dossie Arbour when she comes in. Thanks

## 2019-02-04 NOTE — Telephone Encounter (Signed)
Patient has appt Thurs 02-07-19 to get injection in right shoulder, she would like to have both shoulders done

## 2019-02-04 NOTE — Telephone Encounter (Signed)
Just need to ask Dr. Dossie Arbour when he comes in

## 2019-02-07 ENCOUNTER — Ambulatory Visit: Payer: Medicare Other | Admitting: Pain Medicine

## 2019-02-07 NOTE — Progress Notes (Deleted)
PROVIDER NOTE: Information contained herein reflects review and annotations entered in association with encounter. Interpretation of such information and data should be left to medically-trained personnel. Information provided to patient can be located elsewhere in the medical record under "Patient Instructions". Document created using STT-dictation technology, any transcriptional errors that may result from process are unintentional.    Patient: Ashlee Bruce  Service Category: Procedure  Provider: Gaspar Cola, MD  DOB: [birth year]  DOS: 02/07/2019  Location: [facility]  MRN: [id number]  Setting: Ambulatory - outpatient  Referring Provider: No ref. provider found  Type: Established Patient  Specialty: Interventional Pain Management  PCP: Petra Kuba, MD (Inactive)   Primary Reason for Visit: Interventional Pain Management Treatment. CC: No chief complaint on file.  Procedure:          Anesthesia, Analgesia, Anxiolysis:  Type: Diagnostic Glenohumeral Joint (shoulder) Injection #3  Primary Purpose: Diagnostic Region: Superior Shoulder Area Level:  Shoulder Target Area: Glenohumeral Joint (shoulder) Approach: Anterior approach. Laterality: Right  Type: Moderate (Conscious) Sedation combined with Local Anesthesia Indication(s): Analgesia and Anxiety Route: Intravenous (IV) IV Access: Secured Sedation: Meaningful verbal contact was maintained at all times during the procedure  Local Anesthetic: Lidocaine 1-2%  Position: Supine   Indications: 1. Chronic shoulder pain (Secondary Area of Pain) (Bilateral) (R>L)   2. Osteoarthritis of shoulders (Bilateral)    Pain Score: Pre-procedure:  /10 Post-procedure:  /10   Post-Procedure Evaluation  Procedure (1/21//2021): Diagnostic bilateral lumbar facet block #1 under fluoroscopic guidance and IV sedation Pre-procedure pain level:  8/10 Post-procedure: 0/10 (100% relief)  Sedation: Sedation provided.  No notes  on fileCurrent benefits: Defined as benefit that persist at this time.   Analgesia:   ***  Function:  ***  ROM:  ***   Pre-op Assessment:  Ashlee Bruce is a 64 y.o. (year old), female patient, seen today for interventional treatment. She  has a past surgical history that includes Cholecystectomy; Breast surgery; Knee Arthroplasty; Thyroid surgery; Ectopic pregnancy surgery; Wrist surgery; Anterior cervical decomp/discectomy fusion (N/A, 07/26/2012); Joint replacement (Right, 2007); and Knee arthroscopy. Ashlee Bruce has a current medication list which includes the following prescription(s): acyclovir, chlorthalidone, cyclobenzaprine, docusate sodium, duloxetine, gabapentin, glucose blood, ibuprofen, jardiance, lovastatin, metformin, oxycodone, potassium chloride, and quetiapine. Her primarily concern today is the No chief complaint on file.  Initial Vital Signs:  Pulse/HCG Rate:    Temp:   Resp:   BP:   SpO2:    BMI: Estimated body mass index is 26.15 kg/m as calculated from the following:   Height as of 01/24/19: 5\' 8"  (1.727 m).   Weight as of 01/24/19: 172 lb (78 kg).  Risk Assessment: Allergies: Reviewed. She is allergic to raloxifene.  Allergy Precautions: None required Coagulopathies: Reviewed. None identified.  Blood-thinner therapy: None at this time Active Infection(s): Reviewed. None identified. Ashlee Bruce is afebrile  Site Confirmation: Ashlee Bruce was asked to confirm the procedure and laterality before marking the site Procedure checklist: Completed Consent: Before the procedure and under the influence of no sedative(s), amnesic(s), or anxiolytics, the patient was informed of the treatment options, risks and possible complications. To fulfill our ethical and legal obligations, as recommended by the American Medical Association's Code of Ethics, I have informed the patient of my clinical impression; the nature and purpose of the treatment or procedure; the risks, benefits, and possible  complications of the intervention; the alternatives, including doing nothing; the risk(s) and benefit(s) of the alternative treatment(s) or procedure(s); and the  risk(s) and benefit(s) of doing nothing. The patient was provided information about the general risks and possible complications associated with the procedure. These may include, but are not limited to: failure to achieve desired goals, infection, bleeding, organ or nerve damage, allergic reactions, paralysis, and death. In addition, the patient was informed of those risks and complications associated to the procedure, such as failure to decrease pain; infection; bleeding; organ or nerve damage with subsequent damage to sensory, motor, and/or autonomic systems, resulting in permanent pain, numbness, and/or weakness of one or several areas of the body; allergic reactions; (i.e.: anaphylactic reaction); and/or death. Furthermore, the patient was informed of those risks and complications associated with the medications. These include, but are not limited to: allergic reactions (i.e.: anaphylactic or anaphylactoid reaction(s)); adrenal axis suppression; blood sugar elevation that in diabetics may result in ketoacidosis or comma; water retention that in patients with history of congestive heart failure may result in shortness of breath, pulmonary edema, and decompensation with resultant heart failure; weight gain; swelling or edema; medication-induced neural toxicity; particulate matter embolism and blood vessel occlusion with resultant organ, and/or nervous system infarction; and/or aseptic necrosis of one or more joints. Finally, the patient was informed that Medicine is not an exact science; therefore, there is also the possibility of unforeseen or unpredictable risks and/or possible complications that may result in a catastrophic outcome. The patient indicated having understood very clearly. We have given the patient no guarantees and we have made no  promises. Enough time was given to the patient to ask questions, all of which were answered to the patient's satisfaction. Ashlee Bruce has indicated that she wanted to continue with the procedure. Attestation: I, the ordering provider, attest that I have discussed with the patient the benefits, risks, side-effects, alternatives, likelihood of achieving goals, and potential problems during recovery for the procedure that I have provided informed consent. Date   Time: {CHL [facility]-PAIN TIME CHOICES:[id number]}  Pre-Procedure Preparation:  Monitoring: As per clinic protocol. Respiration, ETCO2, SpO2, BP, heart rate and rhythm monitor placed and checked for adequate function Safety Precautions: Patient was assessed for positional comfort and pressure points before starting the procedure. Time-out: I initiated and conducted the "Time-out" before starting the procedure, as per protocol. The patient was asked to participate by confirming the accuracy of the "Time Out" information. Verification of the correct person, site, and procedure were performed and confirmed by me, the nursing staff, and the patient. "Time-out" conducted as per Joint Commission's Universal Protocol (UP.01.01.01). Time:    Description of Procedure:          Area Prepped: Entire shoulder Area Prepping solution: DuraPrep (Iodine Povacrylex [0.7% available iodine] and Isopropyl Alcohol, 74% w/w) Safety Precautions: Aspiration looking for blood return was conducted prior to all injections. At no point did we inject any substances, as a needle was being advanced. No attempts were made at seeking any paresthesias. Safe injection practices and needle disposal techniques used. Medications properly checked for expiration dates. SDV (single dose vial) medications used. Description of the Procedure: Protocol guidelines were followed. The patient was placed in position over the procedure table. The target area was identified and the area prepped in the  usual manner. Skin & deeper tissues infiltrated with local anesthetic. Appropriate amount of time allowed to pass for local anesthetics to take effect. The procedure needles were then advanced to the target area. Proper needle placement secured. Negative aspiration confirmed. Solution injected in intermittent fashion, asking for systemic symptoms every 0.5cc of injectate.  The needles were then removed and the area cleansed, making sure to leave some of the prepping solution back to take advantage of its long term bactericidal properties.    There were no vitals filed for this visit.  Start Time:   hrs. End Time:   hrs. Materials:  Needle(s) Type: Spinal Needle Gauge: 22G Length: 3.5-in Medication(s): Please see orders for medications and dosing details.  Imaging Guidance (Non-Spinal):          Type of Imaging Technique: Fluoroscopy Guidance (Non-Spinal) Indication(s): Assistance in needle guidance and placement for procedures requiring needle placement in or near specific anatomical locations not easily accessible without such assistance. Exposure Time: Please see nurses notes. Contrast: None used. Fluoroscopic Guidance: I was personally present during the use of fluoroscopy. "Tunnel Vision Technique" used to obtain the best possible view of the target area. Parallax error corrected before commencing the procedure. "Direction-depth-direction" technique used to introduce the needle under continuous pulsed fluoroscopy. Once target was reached, antero-posterior, oblique, and lateral fluoroscopic projection used confirm needle placement in all planes. Images permanently stored in EMR. Interpretation: No contrast injected. I personally interpreted the imaging intraoperatively. Adequate needle placement confirmed in multiple planes. Permanent images saved into the patient's record.  Antibiotic Prophylaxis:   Anti-infectives (From admission, onward)   None     Indication(s): None  identified  Post-operative Assessment:  Post-procedure Vital Signs:  Pulse/HCG Rate:    Temp:   Resp:   BP:   SpO2:    EBL: None  Complications: No immediate post-treatment complications observed by team, or reported by patient.  Note: The patient tolerated the entire procedure well. A repeat set of vitals were taken after the procedure and the patient was kept under observation following institutional policy, for this type of procedure. Post-procedural neurological assessment was performed, showing return to baseline, prior to discharge. The patient was provided with post-procedure discharge instructions, including a section on how to identify potential problems. Should any problems arise concerning this procedure, the patient was given instructions to immediately contact us, at any time, without hesitation. In any case, we plan to contact the patient by telephone for a follow-up status report regarding this interventional procedure.  Comments:  No additional relevant information.  Plan of Care  Orders:  No orders of the defined types were placed in this encounter.  Chronic Opioid Analgesic:  Oxycodone IR 5 mg every 8 hours (15 mg/day of oxycodone). High-Risk for SUD. Prior hx. of Cocaine dependence & Schizoaffective disorder. MME/day: 22.5 mg/day.   Medications ordered for procedure: No orders of the defined types were placed in this encounter.  Medications administered: [place] Koetz. Dimperio had no medications administered during this visit.  See the medical record for exact dosing, route, and time of administration.  Follow-up plan:   No follow-ups on file.       Interventional management considerations: Considering:   NOTE: Currently awaiting to have a right total hip replacement by Dr. Mardelle Matte. Diagnostic right CESI  Diagnostic bilateral cervical facet block  Possible bilateral cervical facet RFA  Diagnostic bilateral lumbar facet blocks  Possible bilateral lumbar facet RFA   Diagnostic right LESI  Diagnostic right vs bilateral TFESI  Diagnostic bilateral SI joint block  Possible bilateral SI joint RFA  Diagnostic right genicular NB  Possible right genicular nerve RFA    PRN Procedures:   Palliative right IA hip joint injection #3  Palliative bilateral suprascapular NB #3  Palliative bilateral suprascapular RFA #2 (last done 10/18/2018) Palliative bilateral  IA shoulder joint injection #2      Recent Visits Date Type Provider Dept  01/28/19 Telemedicine Milinda Pointer, MD [facility]  01/24/19 Procedure visit Milinda Pointer, MD [facility]  Showing recent visits within past 90 days and meeting all other requirements   Today's Visits Date Type Provider Dept  02/07/19 Appointment Milinda Pointer, MD [facility]  Showing today's visits and meeting all other requirements   Future Appointments Date Type Provider Dept  02/27/19 Appointment Milinda Pointer, MD [facility]  Showing future appointments within next 90 days and meeting all other requirements   Disposition: Discharge home  Discharge (Date   Time): 02/07/2019;   hrs.   Primary Care Physician: Petra Kuba, MD (Inactive) Location: [facility] Note by: Gaspar Cola, MD Date: 02/07/2019; Time: 7:42 AM  Disclaimer:  Medicine is not an Chief Strategy Officer. The only guarantee in medicine is that nothing is guaranteed. It is important to note that the decision to proceed with this intervention was based on the information collected from the patient. The Data and conclusions were drawn from the patient's questionnaire, the interview, and the physical examination. Because the information was provided in large part by the patient, it cannot be guaranteed that it has not been purposely or unconsciously manipulated. Every effort has been made to obtain as much relevant data as possible for this evaluation. It is important  to note that the conclusions that lead to this procedure are derived in large part from the available data. Always take into account that the treatment will also be dependent on availability of resources and existing treatment guidelines, considered by other Pain Management Practitioners as being common knowledge and practice, at the time of the intervention. For Medico-Legal purposes, it is also important to point out that variation in procedural techniques and pharmacological choices are the acceptable norm. The indications, contraindications, technique, and results of the above procedure should only be interpreted and judged by a Board-Certified Interventional Pain Specialist with extensive familiarity and expertise in the same exact procedure and technique.

## 2019-02-21 ENCOUNTER — Ambulatory Visit: Payer: Medicare Other | Admitting: Pain Medicine

## 2019-02-26 ENCOUNTER — Ambulatory Visit (HOSPITAL_BASED_OUTPATIENT_CLINIC_OR_DEPARTMENT_OTHER): Payer: Medicare (Managed Care) | Admitting: Pain Medicine

## 2019-02-26 ENCOUNTER — Encounter: Payer: Self-pay | Admitting: Pain Medicine

## 2019-02-26 ENCOUNTER — Other Ambulatory Visit: Payer: Self-pay

## 2019-02-26 ENCOUNTER — Ambulatory Visit
Admission: RE | Admit: 2019-02-26 | Discharge: 2019-02-26 | Disposition: A | Discharge status: Home / Self Care | Payer: Medicare (Managed Care) | Source: Ambulatory Visit | Attending: Pain Medicine | Admitting: Pain Medicine

## 2019-02-26 VITALS — BP 133/84 | HR 75 | Temp 97.2°F | Resp 12 | Ht 68.0 in | Wt 172.0 lb

## 2019-02-26 DIAGNOSIS — G894 Chronic pain syndrome: Secondary | ICD-10-CM

## 2019-02-26 DIAGNOSIS — Z9114 Patient's other noncompliance with medication regimen: Secondary | ICD-10-CM | POA: Insufficient documentation

## 2019-02-26 DIAGNOSIS — M19012 Primary osteoarthritis, left shoulder: Secondary | ICD-10-CM

## 2019-02-26 DIAGNOSIS — M25511 Pain in right shoulder: Secondary | ICD-10-CM | POA: Diagnosis present

## 2019-02-26 DIAGNOSIS — M25512 Pain in left shoulder: Secondary | ICD-10-CM

## 2019-02-26 DIAGNOSIS — F1410 Cocaine abuse, uncomplicated: Secondary | ICD-10-CM | POA: Insufficient documentation

## 2019-02-26 DIAGNOSIS — M19011 Primary osteoarthritis, right shoulder: Secondary | ICD-10-CM | POA: Insufficient documentation

## 2019-02-26 DIAGNOSIS — G8929 Other chronic pain: Secondary | ICD-10-CM | POA: Insufficient documentation

## 2019-02-26 MED ORDER — FENTANYL CITRATE (PF) 100 MCG/2ML IJ SOLN
INTRAMUSCULAR | Status: AC
  Filled 2019-02-26: qty 2

## 2019-02-26 MED ORDER — OXYCODONE HCL 5 MG PO TABS: ORAL_TABLET | ORAL | 0 refills | Status: AC

## 2019-02-26 MED ORDER — ROPIVACAINE HCL 2 MG/ML IJ SOLN
INTRAMUSCULAR | Status: AC
  Filled 2019-02-26: qty 10

## 2019-02-26 MED ORDER — ROPIVACAINE HCL 2 MG/ML IJ SOLN
9.0000 mL | Freq: Once | INTRAMUSCULAR | Status: AC
  Administered 2019-02-26: 9 mL via INTRA_ARTICULAR

## 2019-02-26 MED ORDER — LIDOCAINE HCL 2 % IJ SOLN
20.0000 mL | Freq: Once | INTRAMUSCULAR | Status: AC
  Administered 2019-02-26: 400 mg

## 2019-02-26 MED ORDER — LACTATED RINGERS IV SOLN
1000.0000 mL | Freq: Once | INTRAVENOUS | Status: AC
  Administered 2019-02-26: 1000 mL via INTRAVENOUS

## 2019-02-26 MED ORDER — LIDOCAINE HCL 2 % IJ SOLN
INTRAMUSCULAR | Status: AC
  Filled 2019-02-26: qty 20

## 2019-02-26 MED ORDER — METHYLPREDNISOLONE ACETATE 80 MG/ML IJ SUSP
80.0000 mg | Freq: Once | INTRAMUSCULAR | Status: AC
  Administered 2019-02-26: 80 mg via INTRA_ARTICULAR
  Filled 2019-02-26: qty 1

## 2019-02-26 MED ORDER — FENTANYL CITRATE (PF) 100 MCG/2ML IJ SOLN
25.0000 ug | INTRAMUSCULAR | Status: DC | PRN
  Administered 2019-02-26: 50 ug via INTRAVENOUS

## 2019-02-26 MED ORDER — MIDAZOLAM HCL 5 MG/5ML IJ SOLN
INTRAMUSCULAR | Status: AC
  Filled 2019-02-26: qty 5

## 2019-02-26 MED ORDER — MIDAZOLAM HCL 5 MG/5ML IJ SOLN
1.0000 mg | INTRAMUSCULAR | Status: DC | PRN
  Administered 2019-02-26: 09:00:00 2 mg via INTRAVENOUS

## 2019-02-26 NOTE — Progress Notes (Signed)
PROVIDER NOTE: Information contained herein reflects review and annotations entered in association with encounter. Interpretation of such information and data should be left to medically-trained personnel. Information provided to patient can be located elsewhere in the medical record under "Patient Instructions". Document created using STT-dictation technology, any transcriptional errors that may result from process are unintentional.    Patient: Ashlee Bruce  Service Category: Procedure  Provider: Gaspar Cola, MD  DOB: [birth year]  DOS: 02/26/2019  Location: [facility]  MRN: [id number]  Setting: Ambulatory - outpatient  Referring Provider: No ref. provider found  Type: Established Patient  Specialty: Interventional Pain Management  PCP: Petra Kuba, MD (Inactive)   Primary Reason for Visit: Interventional Pain Management Treatment. CC: Shoulder Pain (bilateral)  Procedure:          Anesthesia, Analgesia, Anxiolysis:  Type: Diagnostic Glenohumeral Joint (shoulder) Injection #2  Primary Purpose: Diagnostic Region: Superior Shoulder Area Level:  Shoulder Target Area: Glenohumeral Joint (shoulder) Approach: Anterior approach. Laterality: Bilateral  Type: Moderate (Conscious) Sedation combined with Local Anesthesia Indication(s): Analgesia and Anxiety Route: Intravenous (IV) IV Access: Secured Sedation: Meaningful verbal contact was maintained at all times during the procedure  Local Anesthetic: Lidocaine 1-2%  Position: Supine   Indications: 1. Chronic shoulder pain (Secondary Area of Pain) (Bilateral) (R>L)   2. Osteoarthritis of glenohumeral joint (Right)   3. Osteoarthritis of shoulders (Bilateral)    Pain Score: Pre-procedure: 9 /10 Post-procedure: 7 /10   Controlled Substance Pharmacotherapy Assessment REMS (Risk Evaluation and Mitigation Strategy)  Analgesic: 01/30/19 UDS (+) for Cocaine Metabolites. Medication agreement broken. Opioid  Pharmacotherapy terminated. No further opioid analgesics prescribed by our practice. High-Risk for SUD. Prior hx. of Cocaine dependence & Schizoaffective disorder. MME/day: 22.5 mg/day to be tapered down to 0 mg/day.   Ashlee Martins, RN  02/26/2019  8:16 AM  Sign when Signing Visit Safety precautions to be maintained throughout the outpatient stay will include: orient to surroundings, keep bed in low position, maintain call bell within reach at all times, provide assistance with transfer out of bed and ambulation.    Pharmacokinetics: Liberation and absorption (onset of action): WNL Distribution (time to peak effect): WNL Metabolism and excretion (duration of action): WNL         Pharmacodynamics: Desired effects: Analgesia: Ashlee Bruce reports >50% benefit. Functional ability: Patient reports that medication allows her to accomplish basic ADLs Clinically meaningful improvement in function (CMIF): Sustained CMIF goals met Perceived effectiveness: Described as relatively effective, allowing for increase in activities of daily living (ADL) Undesirable effects: Side-effects or Adverse reactions: None reported Monitoring: [place] PMP: PDMP reviewed during this encounter. Online review of the past 69-monthperiod conducted. Compliant with practice rules and regulations Last UDS on record: Summary  Date Value Ref Range Status  01/30/2019 Note  Final    Comment:    ==================================================================== [employer] Select 13 (MW) ==================================================================== Test                             Result       Flag       Units Drug Present and Declared for Prescription Verification   Oxycodone                      2927         EXPECTED   ng/mg creat   Oxymorphone  616          EXPECTED   ng/mg creat   Noroxycodone                   3979         EXPECTED   ng/mg creat   Noroxymorphone                 344           EXPECTED   ng/mg creat    Sources of oxycodone are scheduled prescription medications.    Oxymorphone, noroxycodone, and noroxymorphone are expected    metabolites of oxycodone. Oxymorphone is also available as a    scheduled prescription medication. Drug Present not Declared for Prescription Verification   Benzoylecgonine                4489         UNEXPECTED ng/mg creat    Benzoylecgonine is a metabolite of cocaine; its presence indicates    use of this drug.  Source is most commonly illicit, but cocaine is    present in some topical anesthetic solutions.   Alcohol, Ethyl                 0.024        UNEXPECTED g/dL    Sources of ethyl alcohol include alcoholic beverages or as a    fermentation product of glucose; glucose is present in this specimen.    Interpret result with caution, as the presence of ethyl alcohol is    likely due, at least in part, to fermentation of glucose. ==================================================================== Test                      Result    Flag   Units      Ref Range   Creatinine              81               mg/dL      >=20 ==================================================================== Declared Medications:  The flagging and interpretation on this report are based on the  following declared medications.  Unexpected results may arise from  inaccuracies in the declared medications.  **Note: The testing scope of this panel includes these medications:  Oxycodone  **Note: The testing scope of this panel does not include the  following reported medications:  Acyclovir  Chlorthalidone  Cyclobenzaprine  Docusate  Duloxetine  Empagliflozin (Jardiance)  Gabapentin  Ibuprofen  Lovastatin  Metformin  Potassium (Klor-Con)  Quetiapine ==================================================================== For clinical consultation, please call [phone]. ====================================================================    UDS  interpretation: Non-Compliant Undeclared illicit substance detected. Medication Assessment Form: Discrepancies found between patient's report and information collected Treatment compliance: Non-compliant Risk Assessment Profile: Aberrant behavior: non-compliance with practice rules and regulations and use of illicit substances Comorbid factors increasing risk of overdose: bipolar disorder, history of drug addiction, history of substance use disorder and schizophrenia Opioid risk tool (ORT):  Opioid Risk  10/18/2018  Alcohol 0  Illegal Drugs 0  Rx Drugs 0  Alcohol 0  Illegal Drugs 0  Rx Drugs 0  Age between 16-45 years  0  History of Preadolescent Sexual Abuse -  Psychological Disease 2  ADD Negative  OCD Negative  Bipolar Positive  Depression 1  Opioid Risk Tool Scoring 3  Opioid Risk Interpretation Low Risk    ORT Scoring interpretation table:  Score <3 = Low Risk for SUD  Score  between 4-7 = Moderate Risk for SUD  Score >8 = High Risk for Opioid Abuse   Risk of substance use disorder (SUD): Very High  Risk Mitigation Strategies:  Patient Counseling: Ashlee Bruce was counseled against the use of illicit substances Patient-Prescriber Agreement (PPA): Broken  Notification to other healthcare providers: N/A. Opioid therapy discontinued  Pharmacologic Plan: For safety reasons, the treatment plan will be modified to exclude opioids.             Pre-op Assessment:  Ashlee Bruce is a 64 y.o. (year old), female patient, seen today for interventional treatment. She  has a past surgical history that includes Cholecystectomy; Breast surgery; Knee Arthroplasty; Thyroid surgery; Ectopic pregnancy surgery; Wrist surgery; Anterior cervical decomp/discectomy fusion (N/A, 07/26/2012); Joint replacement (Right, 2007); and Knee arthroscopy. Ashlee Bruce has a current medication list which includes the following prescription(s): acyclovir, chlorthalidone, cyclobenzaprine, docusate sodium, duloxetine,  gabapentin, glucose blood, ibuprofen, jardiance, lovastatin, metformin, potassium chloride, quetiapine, and [START ON 03/05/2019] oxycodone, and the following Facility-Administered Medications: fentanyl and midazolam. Her primarily concern today is the Shoulder Pain (bilateral)  Initial Vital Signs:  Pulse/HCG Rate: 75ECG Heart Rate: 72 Temp: (!) 97.2 F (36.2 C) Resp: 16 BP: (!) 115/92 SpO2: 100 %  BMI: Estimated body mass index is 26.15 kg/m as calculated from the following:   Height as of this encounter: _0  (1.727 m).   Weight as of this encounter: 172 lb (78 kg).  Risk Assessment: Allergies: Reviewed. She is allergic to raloxifene.  Allergy Precautions: None required Coagulopathies: Reviewed. None identified.  Blood-thinner therapy: None at this time Active Infection(s): Reviewed. None identified. Ashlee Bruce is afebrile  Site Confirmation: Ashlee Bruce was asked to confirm the procedure and laterality before marking the site Procedure checklist: Completed Consent: Before the procedure and under the influence of no sedative(s), amnesic(s), or anxiolytics, the patient was informed of the treatment options, risks and possible complications. To fulfill our ethical and legal obligations, as recommended by the American Medical Association's Code of Ethics, I have informed the patient of my clinical impression; the nature and purpose of the treatment or procedure; the risks, benefits, and possible complications of the intervention; the alternatives, including doing nothing; the risk(s) and benefit(s) of the alternative treatment(s) or procedure(s); and the risk(s) and benefit(s) of doing nothing. The patient was provided information about the general risks and possible complications associated with the procedure. These may include, but are not limited to: failure to achieve desired goals, infection, bleeding, organ or nerve damage, allergic reactions, paralysis, and death. In addition, the  patient was informed of those risks and complications associated to the procedure, such as failure to decrease pain; infection; bleeding; organ or nerve damage with subsequent damage to sensory, motor, and/or autonomic systems, resulting in permanent pain, numbness, and/or weakness of one or several areas of the body; allergic reactions; (i.e.: anaphylactic reaction); and/or death. Furthermore, the patient was informed of those risks and complications associated with the medications. These include, but are not limited to: allergic reactions (i.e.: anaphylactic or anaphylactoid reaction(s)); adrenal axis suppression; blood sugar elevation that in diabetics may result in ketoacidosis or comma; water retention that in patients with history of congestive heart failure may result in shortness of breath, pulmonary edema, and decompensation with resultant heart failure; weight gain; swelling or edema; medication-induced neural toxicity; particulate matter embolism and blood vessel occlusion with resultant organ, and/or nervous system infarction; and/or aseptic necrosis of one or more joints. Finally, the patient was informed that Medicine  is not an Chief Strategy Officer; therefore, there is also the possibility of unforeseen or unpredictable risks and/or possible complications that may result in a catastrophic outcome. The patient indicated having understood very clearly. We have given the patient no guarantees and we have made no promises. Enough time was given to the patient to ask questions, all of which were answered to the patient's satisfaction. Ashlee Bruce has indicated that she wanted to continue with the procedure. Attestation: I, the ordering provider, attest that I have discussed with the patient the benefits, risks, side-effects, alternatives, likelihood of achieving goals, and potential problems during recovery for the procedure that I have provided informed consent. Date  Time: 02/26/2019  8:13 AM  Pre-Procedure  Preparation:  Monitoring: As per clinic protocol. Respiration, ETCO2, SpO2, BP, heart rate and rhythm monitor placed and checked for adequate function Safety Precautions: Patient was assessed for positional comfort and pressure points before starting the procedure. Time-out: I initiated and conducted the "Time-out" before starting the procedure, as per protocol. The patient was asked to participate by confirming the accuracy of the "Time Out" information. Verification of the correct person, site, and procedure were performed and confirmed by me, the nursing staff, and the patient. "Time-out" conducted as per Joint Commission's Universal Protocol (UP.01.01.01). Time: 0855  Description of Procedure:          Area Prepped: Entire shoulder Area Prepping solution: DuraPrep (Iodine Povacrylex [0.7% available iodine] and Isopropyl Alcohol, 74% w/w) Safety Precautions: Aspiration looking for blood return was conducted prior to all injections. At no point did we inject any substances, as a needle was being advanced. No attempts were made at seeking any paresthesias. Safe injection practices and needle disposal techniques used. Medications properly checked for expiration dates. SDV (single dose vial) medications used. Description of the Procedure: Protocol guidelines were followed. The patient was placed in position over the procedure table. The target area was identified and the area prepped in the usual manner. Skin & deeper tissues infiltrated with local anesthetic. Appropriate amount of time allowed to pass for local anesthetics to take effect. The procedure needles were then advanced to the target area. Proper needle placement secured. Negative aspiration confirmed. Solution injected in intermittent fashion, asking for systemic symptoms every 0.5cc of injectate. The needles were then removed and the area cleansed, making sure to leave some of the prepping solution back to take advantage of its long term  bactericidal properties.    Vitals:   02/26/19 0859 02/26/19 0909 02/26/19 0920 02/26/19 0929  BP: 121/84 132/81 120/75 133/84  Pulse:      Resp: _0 Temp:      TempSrc:      SpO2: 95% 91% 92% 94%  Weight:      Height:        Start Time: 0855 hrs. End Time: 0859 hrs. Materials:  Needle(s) Type: Spinal Needle Gauge: 22G Length: 3.5-in Medication(s): Please see orders for medications and dosing details.  Imaging Guidance (Non-Spinal):          Type of Imaging Technique: Fluoroscopy Guidance (Non-Spinal) Indication(s): Assistance in needle guidance and placement for procedures requiring needle placement in or near specific anatomical locations not easily accessible without such assistance. Exposure Time: Please see nurses notes. Contrast: None used. Fluoroscopic Guidance: I was personally present during the use of fluoroscopy. "Tunnel Vision Technique" used to obtain the best possible view of the target area. Parallax error corrected before commencing the procedure. "Direction-depth-direction" technique used to introduce the  needle under continuous pulsed fluoroscopy. Once target was reached, antero-posterior, oblique, and lateral fluoroscopic projection used confirm needle placement in all planes. Images permanently stored in EMR. Interpretation: No contrast injected. I personally interpreted the imaging intraoperatively. Adequate needle placement confirmed in multiple planes. Permanent images saved into the patient's record.  Antibiotic Prophylaxis:   Anti-infectives (From admission, onward)   None     Indication(s): None identified  Post-operative Assessment:  Post-procedure Vital Signs:  Pulse/HCG Rate: 7573 Temp: (!) 97.2 F (36.2 C) Resp: 12 BP: 133/84 SpO2: 94 %  EBL: None  Complications: No immediate post-treatment complications observed by team, or reported by patient.  Note: The patient tolerated the entire procedure well. A repeat set of vitals  were taken after the procedure and the patient was kept under observation following institutional policy, for this type of procedure. Post-procedural neurological assessment was performed, showing return to baseline, prior to discharge. The patient was provided with post-procedure discharge instructions, including a section on how to identify potential problems. Should any problems arise concerning this procedure, the patient was given instructions to immediately contact us, at any time, without hesitation. In any case, we plan to contact the patient by telephone for a follow-up status report regarding this interventional procedure.  Comments:  No additional relevant information.  Plan of Care  Orders:  Orders Placed This Encounter  Procedures  . SHOULDER INJECTION    Scheduling Instructions:     Side:  (Bilateral)     Sedation: With Sedation.     Timeframe: Today    Order Specific Question:   Where will this procedure be performed?    Answer:   [facility]    Comments:   by Dr. Dossie Arbour  . [facility] C-ARM 1-60 MIN NO REPORT    Intraoperative interpretation by procedural physician at [place].    Standing Status:   Standing    Number of Occurrences:   1    Order Specific Question:   Reason for exam:    Answer:   Assistance in needle guidance and placement for procedures requiring needle placement in or near specific anatomical locations not easily accessible without such assistance.  . Informed Consent Details: Physician/Practitioner Attestation; Transcribe to consent form and obtain patient signature    Nursing Order: Transcribe to consent form and obtain patient signature. Note: Always confirm laterality of pain with Ashlee Bruce, before procedure. Procedure: Shoulder joint injection (glenohumeral and/or acromioclavicular joint) Indication/Reason: Diagnosis and/for treatment of shoulder pain (arthralgia) secondary to shoulder joint problems (arthropathy). Provider  Attestation: I, [place] Dossie Arbour, MD, (Pain Management Specialist), the physician/practitioner, attest that I have discussed with the patient the benefits, risks, side effects, alternatives, likelihood of achieving goals and potential problems during recovery for the procedure that I have provided informed consent.  . Provide equipment / supplies at bedside    Equipment required: Single use, disposable, "Block Tray"    Standing Status:   Standing    Number of Occurrences:   1    Order Specific Question:   Specify    Answer:   Block Tray   Chronic Opioid Analgesic:  01/30/19 UDS (+) for Cocaine Metabolites. Medication agreement broken. Opioid Pharmacotherapy terminated. No further opioid analgesics prescribed by our practice. High-Risk for SUD. Prior hx. of Cocaine dependence & Schizoaffective disorder. MME/day: 22.5 mg/day to be tapered down to 0 mg/day.   Medications ordered for procedure: Meds ordered this encounter  Medications  . methylPREDNISolone acetate (DEPO-MEDROL) injection 80 mg  .  ropivacaine (PF) 2 mg/mL (0.2%) (NAROPIN) injection 9 mL  . lidocaine (XYLOCAINE) 2 % (with pres) injection 400 mg  . lactated ringers infusion 1,000 mL  . midazolam (VERSED) 5 MG/5ML injection 1-2 mg    Make sure Flumazenil is available in the pyxis when using this medication. If oversedation occurs, administer 0.2 mg IV over 15 sec. If after 45 sec no response, administer 0.2 mg again over 1 min; may repeat at 1 min intervals; not to exceed 4 doses (1 mg)  . fentaNYL (SUBLIMAZE) injection 25-50 mcg    Make sure Narcan is available in the pyxis when using this medication. In the event of respiratory depression (RR< 8/min): Titrate NARCAN (naloxone) in increments of 0.1 to 0.2 mg IV at 2-3 minute intervals, until desired degree of reversal.  . oxyCODONE (OXY IR/ROXICODONE) 5 MG immediate release tablet    Sig: Take 1 tablet (5 mg total) by mouth every 12 (twelve) hours for 7 days, THEN 1 tablet (5 mg  total) daily for 7 days, THEN 1 tablet (5 mg total) every other day for 7 days. Must last minimum: 30 days. Max: 3/day. Do not mix w/ alcohol or sedatives, may cause death..    Dispense:  24 tablet    Refill:  0    Med taper: Follow specific schedule, avoid dispensing out of order. Fill 1 day early if closed on scheduled date. Fill on: 03/05/2019   Medications administered: We administered methylPREDNISolone acetate, ropivacaine (PF) 2 mg/mL (0.2%), lidocaine, lactated ringers, midazolam, and fentaNYL.  See the medical record for exact dosing, route, and time of administration.  Follow-up plan:   Return in about 2 weeks (around 03/12/2019) for (VV), (PP).       Interventional management considerations: Considering:   NOTE: Currently awaiting to have a right total hip replacement by Dr. Mardelle Matte. Diagnostic right CESI  Diagnostic bilateral cervical facet block  Possible bilateral cervical facet RFA  Diagnostic bilateral lumbar facet blocks  Possible bilateral lumbar facet RFA  Diagnostic right LESI  Diagnostic right vs bilateral TFESI  Diagnostic bilateral SI joint block  Possible bilateral SI joint RFA  Diagnostic right genicular NB  Possible right genicular nerve RFA    PRN Procedures:   Palliative right IA hip joint injection #3  Palliative bilateral suprascapular NB #3  Palliative bilateral suprascapular RFA #2 (last done 10/18/2018) Palliative bilateral IA shoulder joint injection #2     Recent Visits Date Type Provider Dept  01/28/19 Telemedicine Milinda Pointer, MD [facility]  01/24/19 Procedure visit Milinda Pointer, MD [facility]  Showing recent visits within past 90 days and meeting all other requirements   Today's Visits Date Type Provider Dept  02/26/19 Procedure visit Milinda Pointer, MD [facility]  Showing today's visits and meeting all other requirements   Future Appointments Date Type Provider Dept  03/12/19 Appointment  Milinda Pointer, MD [facility]  Showing future appointments within next 90 days and meeting all other requirements   Disposition: Discharge home  Discharge (Date  Time): 02/26/2019; 0930 hrs.   Primary Care Physician: Petra Kuba, MD (Inactive) Location: [facility] Note by: Gaspar Cola, MD Date: 02/26/2019; Time: 10:53 AM  Disclaimer:  Medicine is not an exact science. The only guarantee in medicine is that nothing is guaranteed. It is important to note that the decision to proceed with this intervention was based on the information collected from the patient. The Data and conclusions were drawn from  the patient's questionnaire, the interview, and the physical examination. Because the information was provided in large part by the patient, it cannot be guaranteed that it has not been purposely or unconsciously manipulated. Every effort has been made to obtain as much relevant data as possible for this evaluation. It is important to note that the conclusions that lead to this procedure are derived in large part from the available data. Always take into account that the treatment will also be dependent on availability of resources and existing treatment guidelines, considered by other Pain Management Practitioners as being common knowledge and practice, at the time of the intervention. For Medico-Legal purposes, it is also important to point out that variation in procedural techniques and pharmacological choices are the acceptable norm. The indications, contraindications, technique, and results of the above procedure should only be interpreted and judged by a Board-Certified Interventional Pain Specialist with extensive familiarity and expertise in the same exact procedure and technique.

## 2019-02-26 NOTE — Progress Notes (Deleted)
   Subjective:    Patient ID: Ashlee Bruce, female    DOB: [birth year], 64 y.o.   MRN: [id number]  HPI    Review of Systems     Objective:   Physical Exam        Assessment & Plan:

## 2019-02-26 NOTE — Progress Notes (Signed)
Safety precautions to be maintained throughout the outpatient stay will include: orient to surroundings, keep bed in low position, maintain call bell within reach at all times, provide assistance with transfer out of bed and ambulation.  

## 2019-02-26 NOTE — Patient Instructions (Signed)
____________________________________________________________________________________________  Post-Procedure Discharge Instructions  Instructions:  Apply ice:   Purpose: This will minimize any swelling and discomfort after procedure.   When: Day of procedure, as soon as you get home.  How: Fill a plastic sandwich bag with crushed ice. Cover it with a small towel and apply to injection site.  How long: (15 min on, 15 min off) Apply for 15 minutes then remove x 15 minutes.  Repeat sequence on day of procedure, until you go to bed.  Apply heat:   Purpose: To treat any soreness and discomfort from the procedure.  When: Starting the next day after the procedure.  How: Apply heat to procedure site starting the day following the procedure.  How long: May continue to repeat daily, until discomfort goes away.  Food intake: Start with clear liquids (like water) and advance to regular food, as tolerated.   Physical activities: Keep activities to a minimum for the first 8 hours after the procedure. After that, then as tolerated.  Driving: If you have received any sedation, be responsible and do not drive. You are not allowed to drive for 24 hours after having sedation.  Blood thinner: (Applies only to those taking blood thinners) You may restart your blood thinner 6 hours after your procedure.  Insulin: (Applies only to Diabetic patients taking insulin) As soon as you can eat, you may resume your normal dosing schedule.  Infection prevention: Keep procedure site clean and dry. Shower daily and clean area with soap and water.  Post-procedure Pain Diary: Extremely important that this be done correctly and accurately. Recorded information will be used to determine the next step in treatment. For the purpose of accuracy, follow these rules:  Evaluate only the area treated. Do not report or include pain from an untreated area. For the purpose of this evaluation, ignore all other areas of pain,  except for the treated area.  After your procedure, avoid taking a long nap and attempting to complete the pain diary after you wake up. Instead, set your alarm clock to go off every hour, on the hour, for the initial 8 hours after the procedure. Document the duration of the numbing medicine, and the relief you are getting from it.  Do not go to sleep and attempt to complete it later. It will not be accurate. If you received sedation, it is likely that you were given a medication that may cause amnesia. Because of this, completing the diary at a later time may cause the information to be inaccurate. This information is needed to plan your care.  Follow-up appointment: Keep your post-procedure follow-up evaluation appointment after the procedure (usually 2 weeks for most procedures, 6 weeks for radiofrequencies). DO NOT FORGET to bring you pain diary with you.   Expect: (What should I expect to see with my procedure?)  From numbing medicine (AKA: Local Anesthetics): Numbness or decrease in pain. You may also experience some weakness, which if present, could last for the duration of the local anesthetic.  Onset: Full effect within 15 minutes of injected.  Duration: It will depend on the type of local anesthetic used. On the average, 1 to 8 hours.   From steroids (Applies only if steroids were used): Decrease in swelling or inflammation. Once inflammation is improved, relief of the pain will follow.  Onset of benefits: Depends on the amount of swelling present. The more swelling, the longer it will take for the benefits to be seen. In some cases, up to 10 days.    Duration: Steroids will stay in the system x 2 weeks. Duration of benefits will depend on multiple posibilities including persistent irritating factors.  Side-effects: If present, they may typically last 2 weeks (the duration of the steroids).  Frequent: Cramps (if they occur, drink Gatorade and take over-the-counter Magnesium 450-500 mg  once to twice a day); water retention with temporary weight gain; increases in blood sugar; decreased immune system response; increased appetite.  Occasional: Facial flushing (red, warm cheeks); mood swings; menstrual changes.  Uncommon: Long-term decrease or suppression of natural hormones; bone thinning. (These are more common with higher doses or more frequent use. This is why we prefer that our patients avoid having any injection therapies in other practices.)   Very Rare: Severe mood changes; psychosis; aseptic necrosis.  From procedure: Some discomfort is to be expected once the numbing medicine wears off. This should be minimal if ice and heat are applied as instructed.  Call if: (When should I call?)  You experience numbness and weakness that gets worse with time, as opposed to wearing off.  New onset bowel or bladder incontinence. (Applies only to procedures done in the spine)  Emergency Numbers:  Durning business hours (Monday - Thursday, 8:00 AM - 4:00 PM) (Friday, 9:00 AM - 12:00 Noon): [phone]  After hours: [phone]  NOTE: If you are having a problem and are unable connect with, or to talk to a provider, then go to your nearest urgent care or emergency department. If the problem is serious and urgent, please call 911. ____________________________________________________________________________________________   ____________________________________________________________________________________________  Drug Holidays (Slow)  What is a "Drug Holiday"? Drug Holiday: is the name given to the period of time during which a patient stops taking a medication(s) for the purpose of eliminating tolerance to the drug.  Benefits . Improved effectiveness of opioids. . Decreased opioid dose needed to achieve benefits. . Improved pain with lesser dose.  What is tolerance? Tolerance: is the progressive decreased in effectiveness of a drug due to its repetitive use.  With repetitive use, the body gets use to the medication and as a consequence, it loses its effectiveness. This is a common problem seen with opioid pain medications. As a result, a larger dose of the drug is needed to achieve the same effect that used to be obtained with a smaller dose.  How long should a "Drug Holiday" last? You should stay off of the pain medicine for at least 14 consecutive days. (2 weeks)  Should I stop the medicine "cold [place]"? No. You should always coordinate with your Pain Specialist so that he/she can provide you with the correct medication dose to make the transition as smoothly as possible.  How do I stop the medicine? Slowly. You will be instructed to decrease the daily amount of pills that you take by one (1) pill every seven (7) days. This is called a "slow downward taper" of your dose. For example: if you normally take four (4) pills per day, you will be asked to drop this dose to three (3) pills per day for seven (7) days, then to two (2) pills per day for seven (7) days, then to one (1) per day for seven (7) days, and at the end of those last seven (7) days, this is when the "Drug Holiday" would start.   Will I have withdrawals? By doing a "slow downward taper" like this one, it is unlikely that you will experience any significant withdrawal symptoms. Typically, what triggers withdrawals is the  sudden stop of a high dose opioid therapy. Withdrawals can usually be avoided by slowly decreasing the dose over a prolonged period of time.  What are withdrawals? Withdrawals: refers to the wide range of symptoms that occur after stopping or dramatically reducing opiate drugs after heavy and prolonged use. Withdrawal symptoms do not occur to patients that use low dose opioids, or those who take the medication sporadically. Contrary to benzodiazepine (example: Valium, Xanax, etc.) or alcohol withdrawals ("Delirium Tremens"), opioid withdrawals are not lethal. Withdrawals are  the physical manifestation of the body getting rid of the excess receptors.  Expected Symptoms Early symptoms of withdrawal may include: . Agitation . Anxiety . Muscle aches . Increased tearing . Insomnia . Runny nose . Sweating . Yawning  Late symptoms of withdrawal may include: . Abdominal cramping . Diarrhea . Dilated pupils . Goose bumps . Nausea . Vomiting  Will I experience withdrawals? Due to the slow nature of the taper, it is very unlikely that you will experience any.  What is a slow taper? Taper: refers to the gradual decrease in dose.  ___________________________________________________________________________________________    ____________________________________________________________________________________________  Medication Rules  Purpose: To inform patients, and their family members, of our rules and regulations.  Applies to: All patients receiving prescriptions (written or electronic).  Pharmacy of record: Pharmacy where electronic prescriptions will be sent. If written prescriptions are taken to a different pharmacy, please inform the nursing staff. The pharmacy listed in the electronic medical record should be the one where you would like electronic prescriptions to be sent.  Electronic prescriptions: In compliance with the [place] (STOP) Act of 2017 (Session Lanny Cramp [phone]), effective January 03, 2018, all controlled substances must be electronically prescribed. Calling prescriptions to the pharmacy will cease to exist.  Prescription refills: Only during scheduled appointments. Applies to all prescriptions.  NOTE: The following applies primarily to controlled substances (Opioid* Pain Medications).   Patient's responsibilities: 1. Pain Pills: Bring all pain pills to every appointment (except for procedure appointments). 2. Pill Bottles: Bring pills in original pharmacy bottle. Always bring the newest  bottle. Bring bottle, even if empty. 3. Medication refills: You are responsible for knowing and keeping track of what medications you take and those you need refilled. The day before your appointment: write a list of all prescriptions that need to be refilled. The day of the appointment: give the list to the admitting nurse. Prescriptions will be written only during appointments. No prescriptions will be written on procedure days. If you forget a medication: it will not be "Called in", "Faxed", or "electronically sent". You will need to get another appointment to get these prescribed. No early refills. Do not call asking to have your prescription filled early. 4. Prescription Accuracy: You are responsible for carefully inspecting your prescriptions before leaving our office. Have the discharge nurse carefully go over each prescription with you, before taking them home. Make sure that your name is accurately spelled, that your address is correct. Check the name and dose of your medication to make sure it is accurate. Check the number of pills, and the written instructions to make sure they are clear and accurate. Make sure that you are given enough medication to last until your next medication refill appointment. 5. Taking Medication: Take medication as prescribed. When it comes to controlled substances, taking less pills or less frequently than prescribed is permitted and encouraged. Never take more pills than instructed. Never take medication more frequently than prescribed.  6.  Inform other Doctors: Always inform, all of your healthcare providers, of all the medications you take. 7. Pain Medication from other Providers: You are not allowed to accept any additional pain medication from any other Doctor or Healthcare provider. There are two exceptions to this rule. (see below) In the event that you require additional pain medication, you are responsible for notifying us, as stated below. 8. Medication  Agreement: You are responsible for carefully reading and following our Medication Agreement. This must be signed before receiving any prescriptions from our practice. Safely store a copy of your signed Agreement. Violations to the Agreement will result in no further prescriptions. (Additional copies of our Medication Agreement are available upon request.) 9. Laws, Rules, & Regulations: All patients are expected to follow all Federal and [employer], Rules, [employer] [facility]. Ignorance of the Laws does not constitute a valid excuse.  10. Illegal drugs and Controlled Substances: The use of illegal substances (including, but not limited to marijuana and its derivatives) and/or the illegal use of any controlled substances is strictly prohibited. Violation of this rule will result in the immediate and permanent discontinuation of any and all prescriptions being written by our practice. The use of any illegal substances is prohibited. 11. Adopted CDC guidelines & recommendations: Target dosing levels will be at or below 60 MME/day. Use of benzodiazepines** is not recommended.  Exceptions: There are only two exceptions to the rule of not receiving pain medications from other Healthcare Providers. 1. Exception #1 (Emergencies): In the event of an emergency (i.e.: accident requiring emergency care), you are allowed to receive additional pain medication. However, you are responsible for: As soon as you are able, call our office [phone], at any time of the day or night, and leave a message stating your name, the date and nature of the emergency, and the name and dose of the medication prescribed. In the event that your call is answered by a member of our staff, make sure to document and save the date, time, and the name of the person that took your information.  2. Exception #2 (Planned Surgery): In the event that you are scheduled by another doctor or dentist to have any type of surgery or procedure, you  are allowed (for a period no longer than 30 days), to receive additional pain medication, for the acute post-op pain. However, in this case, you are responsible for picking up a copy of our "Post-op Pain Management for Surgeons" handout, and giving it to your surgeon or dentist. This document is available at our office, and does not require an appointment to obtain it. Simply go to our office during business hours (Monday-Thursday from 8:00 AM to 4:00 PM) (Friday 8:00 AM to 12:00 Noon) or if you have a scheduled appointment with [place], prior to your surgery, and ask for it by name. In addition, you will need to provide [place] with your name, name of your surgeon, type of surgery, and date of procedure or surgery.  *Opioid medications include: morphine, codeine, oxycodone, oxymorphone, hydrocodone, hydromorphone, meperidine, tramadol, tapentadol, buprenorphine, fentanyl, methadone. **Benzodiazepine medications include: diazepam (Valium), alprazolam (Xanax), clonazepam (Klonopine), lorazepam (Ativan), clorazepate (Tranxene), chlordiazepoxide (Librium), estazolam (Prosom), oxazepam (Serax), temazepam (Restoril), triazolam (Halcion) (Last updated: 03/02/2017) ____________________________________________________________________________________________

## 2019-02-27 ENCOUNTER — Telehealth: Payer: Medicare Other | Admitting: Pain Medicine

## 2019-03-05 ENCOUNTER — Telehealth: Payer: Self-pay

## 2019-03-05 NOTE — Telephone Encounter (Signed)
The patient called and she is very upset that Dr. Dossie Arbour cut her pills down to 24. She states she will not continue to come here and spend her money if that's all she is getting. I asked her if she wanted to cancel her upcoming appointment but she said she would keep it and tell Dr. Dossie Arbour then about her medicines.

## 2019-03-11 ENCOUNTER — Encounter: Payer: Self-pay | Admitting: Pain Medicine

## 2019-03-11 NOTE — Progress Notes (Signed)
Pain relief after procedure (treated area only): (Questions asked to patient) 1. Starting about 15 minutes after the procedure, and "while the area was still numb" (from the local anesthetics), were you having any of your usual pain "in that area" (the treated area)?  (NOTE: NOT including the discomfort from the needle sticks.) First 1 hour: 80 % better. First 4-6 hours:80 % better. 2. How long did the numbness from the local anesthetics last? (More than 6 hours?) Duration:24 hours.  3. How much better is your pain now, when compared to before the procedure? Current benefit:0 % better. 4. Can you move better now? Improvement in ROM (Range of Motion): Yes. 5. Can you do more now? Improvement in function: Yes. 4. Did you have any problems with the procedure? Side-effects/Complications: No.

## 2019-03-12 ENCOUNTER — Ambulatory Visit: Payer: Medicare (Managed Care) | Attending: Pain Medicine | Admitting: Pain Medicine

## 2019-03-12 ENCOUNTER — Other Ambulatory Visit: Payer: Self-pay

## 2019-03-12 DIAGNOSIS — M19011 Primary osteoarthritis, right shoulder: Secondary | ICD-10-CM | POA: Diagnosis not present

## 2019-03-12 DIAGNOSIS — M19012 Primary osteoarthritis, left shoulder: Secondary | ICD-10-CM

## 2019-03-12 DIAGNOSIS — Z9114 Patient's other noncompliance with medication regimen: Secondary | ICD-10-CM

## 2019-03-12 DIAGNOSIS — F1410 Cocaine abuse, uncomplicated: Secondary | ICD-10-CM

## 2019-03-12 DIAGNOSIS — G8929 Other chronic pain: Secondary | ICD-10-CM

## 2019-03-12 DIAGNOSIS — G894 Chronic pain syndrome: Secondary | ICD-10-CM

## 2019-03-12 DIAGNOSIS — M25512 Pain in left shoulder: Secondary | ICD-10-CM

## 2019-03-12 NOTE — Progress Notes (Signed)
Patient: Ashlee Bruce  Service Category: E/M  Provider: Gaspar Cola, MD  DOB: 04/08/1955  DOS: 03/12/2019  Location: Office  MRN: [id number]  Setting: Ambulatory outpatient  Referring Provider: No ref. provider found  Type: Established Patient  Specialty: Interventional Pain Management  PCP: Petra Kuba, MD (Inactive)  Location: Remote location  Delivery: TeleHealth     Virtual Encounter - Pain Management PROVIDER NOTE: Information contained herein reflects review and annotations entered in association with encounter. Interpretation of such information and data should be left to medically-trained personnel. Information provided to patient can be located elsewhere in the medical record under "Patient Instructions". Document created using STT-dictation technology, any transcriptional errors that may result from process are unintentional.    Contact & Pharmacy Preferred: [phone] Home: [phone] (home) Mobile: [phone] (mobile) E-mail: [email or web address]  [facility] [place] [address] [facility] [place] [address] Phone: [phone] Fax: [phone]   Pre-screening  [name] offered "in-person" vs "virtual" encounter. She indicated preferring virtual for this encounter.   Reason COVID-19*  Social distancing based on CDC and AMA recommendations.   I contacted [name] on 03/12/2019 via telephone.      I clearly identified myself as Gaspar Cola, MD. I verified that I was speaking with the correct person using two identifiers (Name: Ashlee Bruce, and date of birth: 04-06-1955).  Consent I sought verbal advanced consent from [name] for virtual visit interactions. I informed [name] of possible security and privacy concerns, risks, and limitations associated with providing "not-in-person" medical evaluation and management services. I also informed [name] of the  availability of "in-person" appointments. Finally, I informed her that there would be a charge for the virtual visit and that she could be  personally, fully or partially, financially responsible for it. [name] expressed understanding and agreed to proceed.   Historic Elements   Ashlee Bruce is a 64 y.o. year old, female patient evaluated today after her last contact with our practice on 03/05/2019. [name]  has a past medical history of Bipolar disorder ([place]), Depression, Diabetes mellitus without complication ([place]), GERD (gastroesophageal reflux disease), Hypertension, Shortness of breath, and Transfusion history. She also  has a past surgical history that includes Cholecystectomy; Breast surgery; Knee Arthroplasty; Thyroid surgery; Ectopic pregnancy surgery; Wrist surgery; Anterior cervical decomp/discectomy fusion (N/A, 07/26/2012); Joint replacement (Right, 2007); and Knee arthroscopy. [name] has a current medication list which includes the following prescription(s): acyclovir, chlorthalidone, cyclobenzaprine, docusate sodium, duloxetine, gabapentin, glucose blood, ibuprofen, jardiance, lovastatin, metformin, oxycodone, potassium chloride, and quetiapine. She  reports that she has never smoked. She has never used smokeless tobacco. She reports current alcohol use of about 1.0 standard drinks of alcohol per week. She reports that she does not use drugs. [name] is allergic to raloxifene.   HPI  Today, she is being contacted for a post-procedure assessment.  The patient indicates that she was doing well and had no pain until yesterday.  She informs me that today she is again doing well and it would appear that the bilateral intra-articular shoulder joint injection did provide her with some long-term benefit.  Unfortunately the pain seems to be intermittent at this point.  Today I have talked to her about the possibility of repeating the suprascapular nerve radiofrequency ablation, but at this  point the patient simply cut me off and asked me if I was going to be  taking her off of her oxycodone.  I reminded her that our agreement stated that she was not supposed to use any illegal substances while using the opioid analgesics.  I explained to her that this was a safety concern and she indicated that she admitted that she had "slipped", but she wanted me to resume prescribing the medicine, which I declined.  I told her that I would be more than willing to continue treating her with none opioid analgesics and interventional therapies, but she indicated that she was not interested in any further injections and she wanted the pain medicine and therefore she did not think she would need to be coming back to [place].  In summary, the patient stated that because we would be discontinuing her opioid, secondary to her UDS being positive for cocaine, she would not be coming back.  I did not discharge her from the clinic, I simply eliminated opioid analgesic pharmacotherapy has a treatment option for her, secondary to her persistent substance abuse.  Post-Procedure Evaluation  Procedure (02/26/2019): Diagnostic bilateral IA shoulder joint injection #2 under fluoroscopic guidance and IV sedation Pre-procedure pain level:  9/10 Post-procedure: 7/10 (< 50% relief)  Sedation: Sedation provided.  [name], RN  03/11/2019  1:07 PM  Sign when Signing Visit Pain relief after procedure (treated area only): (Questions asked to patient) 1. Starting about 15 minutes after the procedure, and "while the area was still numb" (from the local anesthetics), were you having any of your usual pain "in that area" (the treated area)?  (NOTE: NOT including the discomfort from the needle sticks.) First 1 hour: 80 % better. First 4-6 hours:80 % better. 2. How long did the numbness from the local anesthetics last? (More than 6 hours?) Duration:24 hours.  3. How much better is your pain now, when compared to before the  procedure? Current benefit:0 % better. 4. Can you move better now? Improvement in ROM (Range of Motion): Yes. 5. Can you do more now? Improvement in function: Yes. 4. Did you have any problems with the procedure? Side-effects/Complications: No.  Current benefits: Defined as benefit that persist at this time.   Analgesia:  Back to baseline Function: Ms. Talamantez reports improvement in function ROM: Ms. Cleckley reports improvement in ROM  Pharmacotherapy Assessment  Analgesic: 01/30/19 UDS (+) for Cocaine Metabolites. Medication agreement broken. Opioid Pharmacotherapy terminated. No further opioid analgesics prescribed by our practice. High-Risk for SUD. Prior hx. of Cocaine dependence & Schizoaffective disorder. MME/day: 22.5 mg/day to be tapered down to 0 mg/day.   Monitoring: [place] PMP: PDMP reviewed during this encounter.       Pharmacotherapy: No side-effects or adverse reactions reported. Compliance: No problems identified. Effectiveness: Clinically acceptable. Plan: Refer to "POC".  UDS:  Summary  Date Value Ref Range Status  01/30/2019 Note  Final    Comment:    ==================================================================== [employer] Select 13 (MW) ==================================================================== Test                             Result       Flag       Units Drug Present and Declared for Prescription Verification   Oxycodone                      2927         EXPECTED   ng/mg creat   Oxymorphone  616          EXPECTED   ng/mg creat   Noroxycodone                   3979         EXPECTED   ng/mg creat   Noroxymorphone                 344          EXPECTED   ng/mg creat    Sources of oxycodone are scheduled prescription medications.    Oxymorphone, noroxycodone, and noroxymorphone are expected    metabolites of oxycodone. Oxymorphone is also available as a    scheduled prescription medication. Drug Present not Declared for Prescription  Verification   Benzoylecgonine                4489         UNEXPECTED ng/mg creat    Benzoylecgonine is a metabolite of cocaine; its presence indicates    use of this drug.  Source is most commonly illicit, but cocaine is    present in some topical anesthetic solutions.   Alcohol, Ethyl                 0.024        UNEXPECTED g/dL    Sources of ethyl alcohol include alcoholic beverages or as a    fermentation product of glucose; glucose is present in this specimen.    Interpret result with caution, as the presence of ethyl alcohol is    likely due, at least in part, to fermentation of glucose. ==================================================================== Test                      Result    Flag   Units      Ref Range   Creatinine              81               mg/dL      >=20 ==================================================================== Declared Medications:  The flagging and interpretation on this report are based on the  following declared medications.  Unexpected results may arise from  inaccuracies in the declared medications.  **Note: The testing scope of this panel includes these medications:  Oxycodone  **Note: The testing scope of this panel does not include the  following reported medications:  Acyclovir  Chlorthalidone  Cyclobenzaprine  Docusate  Duloxetine  Empagliflozin (Jardiance)  Gabapentin  Ibuprofen  Lovastatin  Metformin  Potassium ([place])  Quetiapine ==================================================================== For clinical consultation, please call [phone]. ====================================================================    Laboratory Chemistry Profile   Renal Lab Results  Component Value Date   BUN 21 04/27/2018   CREATININE 0.92 04/27/2018   GFRAA >60 04/27/2018   GFRNONAA >60 04/27/2018    Hepatic Lab Results  Component Value Date   AST 12 (L) 04/27/2018   ALT 14 04/27/2018   ALBUMIN 4.0 04/27/2018    ALKPHOS 102 04/27/2018    Electrolytes Lab Results  Component Value Date   NA 138 04/27/2018   K 3.1 (L) 04/27/2018   CL 102 04/27/2018   CALCIUM 8.7 (L) 04/27/2018   MG 2.1 04/27/2018    Bone Lab Results  Component Value Date   25OHVITD1 36 04/27/2018   25OHVITD2 <1.0 04/27/2018   25OHVITD3 36 04/27/2018    Inflammation (CRP: Acute Phase) (ESR: Chronic Phase) Lab Results  Component Value Date   CRP <0.8 04/27/2018  ESRSEDRATE 19 04/27/2018      Note: Above Lab results reviewed.  Imaging  DG PAIN CLINIC C-ARM 1-60 MIN NO REPORT Fluoro was used, but no Radiologist interpretation will be provided.  Please refer to "NOTES" tab for provider progress note.  Assessment  The primary encounter diagnosis was Chronic shoulder pain (Secondary Area of Pain) (Bilateral) (R>L). Diagnoses of Osteoarthritis of glenohumeral joint (Right), Osteoarthritis of shoulders (Bilateral), Chronic pain syndrome, Cocaine abuse, continuous use ([place]), and Pain medication agreement broken were also pertinent to this visit.  Plan of Care  Problem-specific:  No problem-specific Assessment & Plan notes found for this encounter.  Ms. MERTHA CLYATT has a current medication list which includes the following long-term medication(s): cyclobenzaprine, gabapentin, metformin, oxycodone, and potassium chloride.  Pharmacotherapy (Medications Ordered): No orders of the defined types were placed in this encounter.  Orders:  No orders of the defined types were placed in this encounter.  Follow-up plan:   No follow-ups on file.  The patient indicated that she did not want to come back if we were not going to be giving her any prescriptions for opioids.  Since she tested positive for cocaine in her urine drug screen test, this is no longer an option for her.     Interventional management considerations: Considering:   NOTE: Currently awaiting to have a right total hip replacement by Dr. Mardelle Matte. Diagnostic right  CESI  Diagnostic bilateral cervical facet block  Possible bilateral cervical facet RFA  Diagnostic bilateral lumbar facet blocks  Possible bilateral lumbar facet RFA  Diagnostic right LESI  Diagnostic right vs bilateral TFESI  Diagnostic bilateral SI joint block  Possible bilateral SI joint RFA  Diagnostic right genicular NB  Possible right genicular nerve RFA    PRN Procedures:   Palliative right IA hip joint injection #3  Palliative bilateral suprascapular NB #3  Palliative bilateral suprascapular RFA #2 (last done 10/18/2018) Palliative bilateral IA shoulder joint injection #2     Recent Visits Date Type Provider Dept  02/26/19 Procedure visit Milinda Pointer, MD [facility]  01/28/19 Telemedicine Milinda Pointer, MD [facility]  01/24/19 Procedure visit Milinda Pointer, MD [facility]  Showing recent visits within past 90 days and meeting all other requirements   Today's Visits Date Type Provider Dept  03/12/19 Telemedicine Milinda Pointer, MD [facility]  Showing today's visits and meeting all other requirements   Future Appointments No visits were found meeting these conditions.  Showing future appointments within next 90 days and meeting all other requirements   I discussed the assessment and treatment plan with the patient. The patient was provided an opportunity to ask questions and all were answered. The patient agreed with the plan and demonstrated an understanding of the instructions.  Patient advised to call back or seek an in-person evaluation if the symptoms or condition worsens.  Duration of encounter: 12 minutes.  Note by: Gaspar Cola, MD Date: 03/12/2019; Time: 9:01 AM

## 2019-03-21 ENCOUNTER — Other Ambulatory Visit: Payer: Self-pay | Admitting: Pain Medicine

## 2019-03-21 DIAGNOSIS — G894 Chronic pain syndrome: Secondary | ICD-10-CM

## 2019-05-03 IMAGING — CR RIGHT SHOULDER - 2+ VIEW
1 series · 4 of 4 positions shown · non-contrast
Comparison: None.

CLINICAL DATA: Right shoulder pain

EXAM:
RIGHT SHOULDER - 2+ VIEW

[Series 1: dg shoulder right · 0.14mm/px · 4 of 4 slices shown]
[im 1/4]
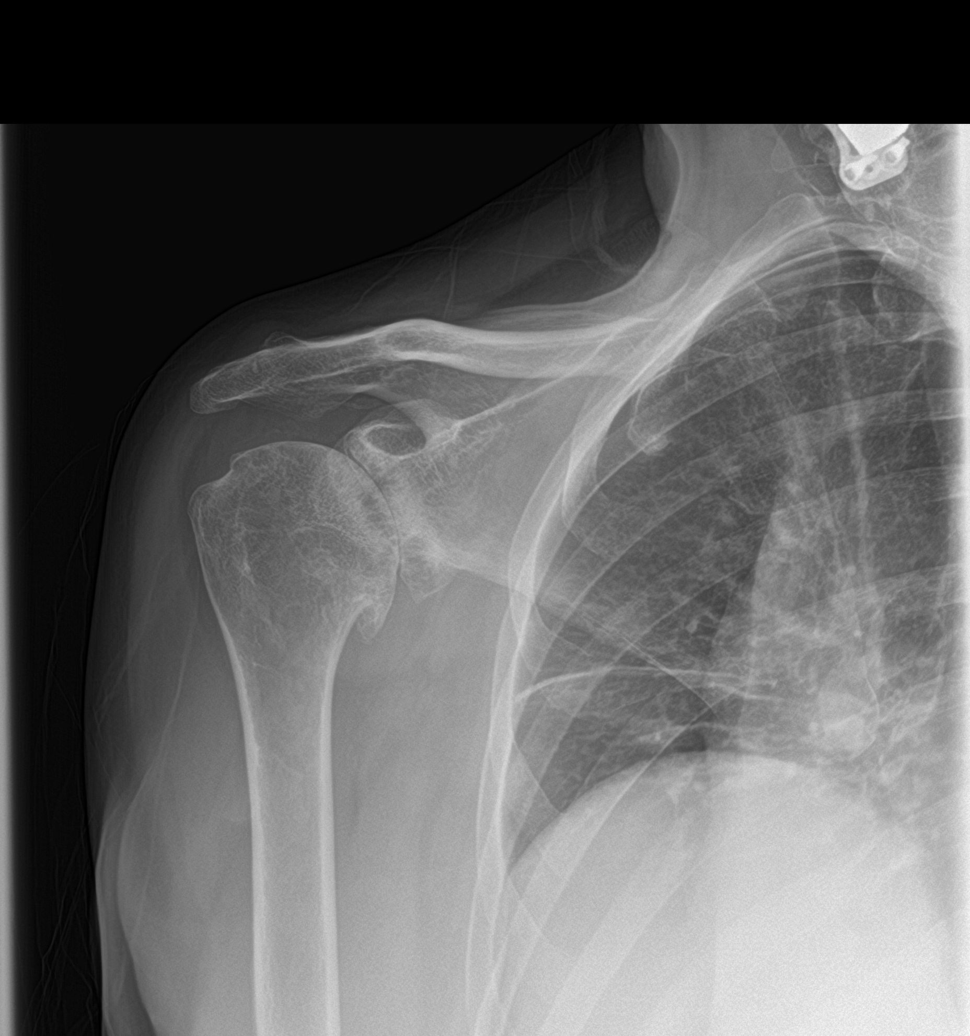
[im 2/4]
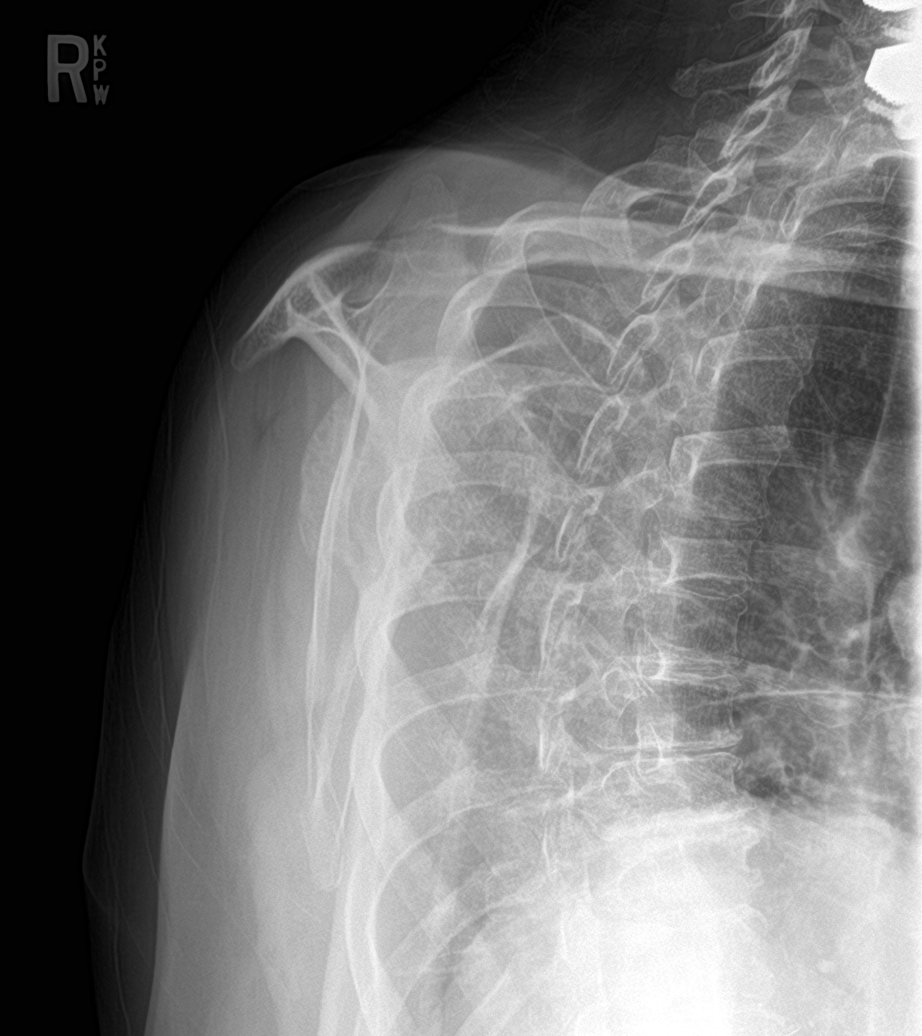
[im 3/4]
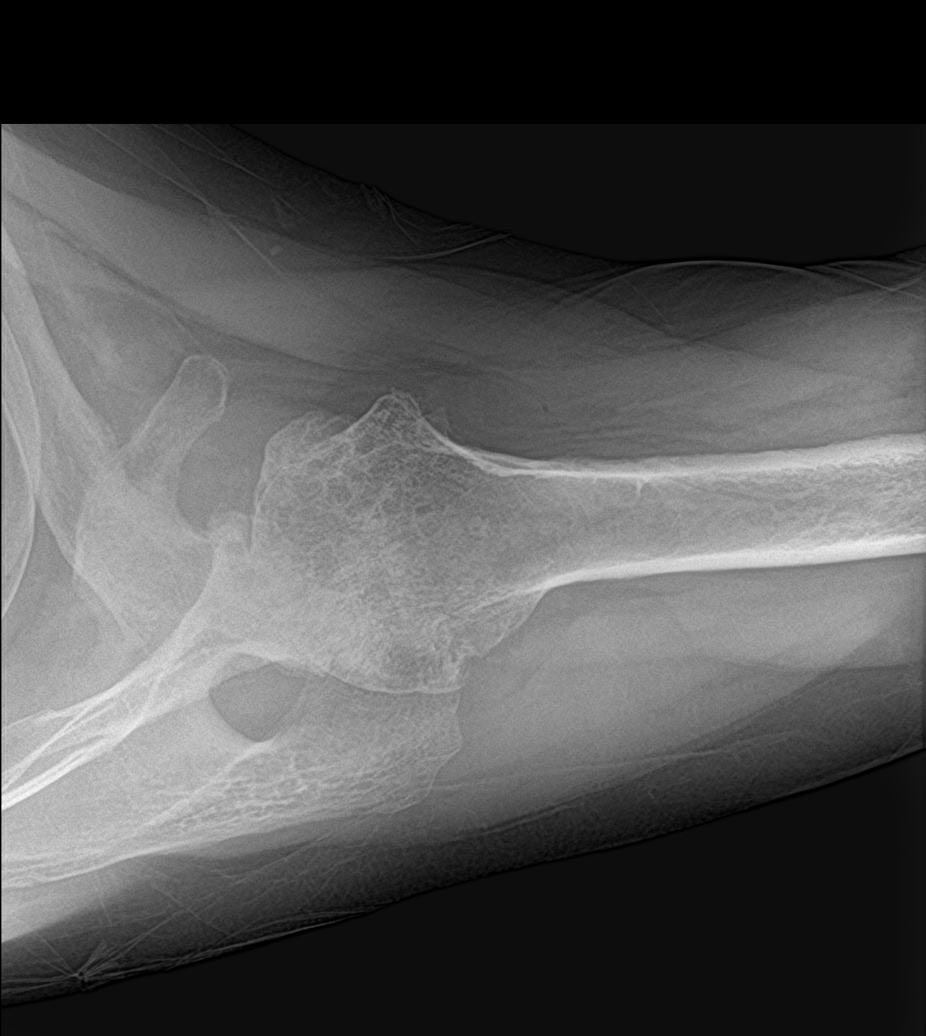
[im 4/4]
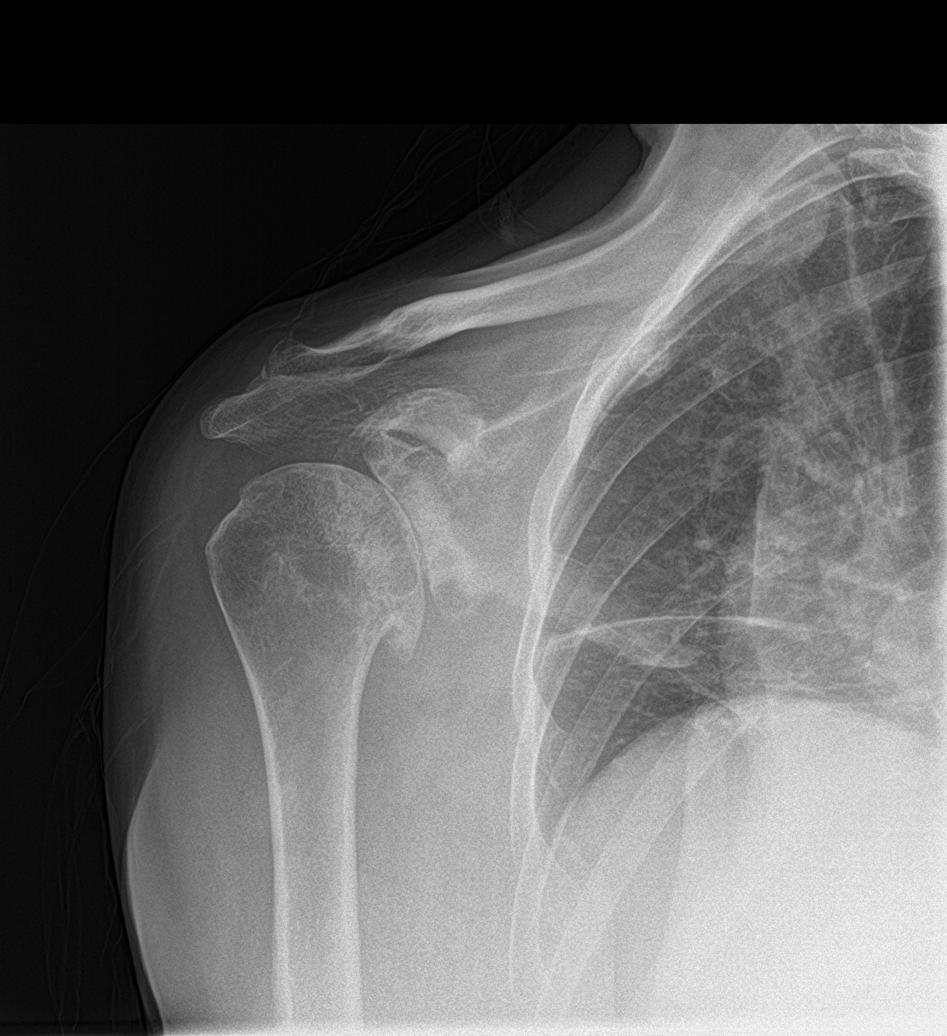

[4 of 4 positions shown; findings below may reference images not displayed]

FINDINGS: Surgical hardware in the cervical spine. Scarring at the right base.
AC joint is intact. No fracture or malalignment. Advanced arthritis
of the glenohumeral interval with narrowing, subarticular sclerosis
and prominent inferior bony spurring.
IMPRESSION: 1. No acute osseous abnormality
2. Advanced arthritis of the right glenohumeral joint

## 2019-08-22 NOTE — Progress Notes (Addendum)
DUE TO COVID-19 ONLY ONE VISITOR IS ALLOWED TO COME WITH YOU AND STAY IN THE WAITING ROOM ONLY DURING PRE OP AND PROCEDURE DAY OF SURGERY. THE 1 VISITOR  MAY VISIT WITH YOU AFTER SURGERY IN YOUR PRIVATE ROOM DURING VISITING HOURS ONLY!  YOU NEED TO HAVE A COVID 19 TEST ON___8/27/21 ____ @_______ , THIS TEST MUST BE DONE BEFORE SURGERY,  COVID TESTING SITE [address] [place] [address], IT IS ON THE RIGHT GOING OUT [address] APPROXIMATELY  2 MINUTES PAST [employer] ON THE RIGHT. ONCE YOUR COVID TEST IS COMPLETED,  PLEASE BEGIN THE QUARANTINE INSTRUCTIONS AS OUTLINED IN YOUR HANDOUT.                Ashlee Bruce  08/22/2019   Your procedure is scheduled on:  09/03/19  Report to [facility] Main  Entrance   Report to admitting at     1100 AM     Call this number if you have problems the morning of surgery [phone]    Remember: Do not eat food  :After Midnight. BRUSH YOUR TEETH MORNING OF SURGERY AND RINSE YOUR MOUTH OUT, NO CHEWING GUM CANDY OR MINTS.   NO SOLID FOOD AFTER MIDNIGHT THE NIGHT PRIOR TO SURGERY. NOTHING BY MOUTH EXCEPT CLEAR LIQUIDS UNTIL   1030am. PLEASE FINISH G2 DRINK PER SURGEON ORDER  WHICH NEEDS TO BE COMPLETED AT 1030am .    CLEAR LIQUID DIET   Foods Allowed                                                                   Coffee and tea, regular and decaf                           Plain Jell-O any favor except red or                                   Fruit ices (not with fruit pulp)                                  Iced Popsicles                                    Carbonated beverages, regular and diet                                    Cranberry, grape and apple juices Sports drinks like Gatorade Lightly seasoned clear broth or consume(fat free) Sugar, honey syrup                                                                   _____________________________________________________________________    Take  these medicines the morning of surgery with A SIP OF WATER: Cymbalta   , Hold Jardiance day before surgery.  DO NOT TAKE ANY DIABETIC MEDICATIONS DAY OF YOUR SURGERY                               You may not have any metal on your body including hair pins and              piercings  Do not wear jewelry, make-up, lotions, powders or perfumes, deodorant             Do not wear nail polish on your fingernails.  Do not shave  48 hours prior to surgery.            Do not bring valuables to the hospital. [place].  Contacts, dentures or bridgework may not be worn into surgery.  Leave suitcase in the car. After surgery it may be brought to your room.     Patients discharged the day of surgery will not be allowed to drive home. IF YOU ARE HAVING SURGERY AND GOING HOME THE SAME DAY, YOU MUST HAVE AN ADULT TO DRIVE YOU HOME AND BE WITH YOU FOR 24 HOURS. YOU MAY GO HOME BY TAXI OR UBER OR ORTHERWISE, BUT AN ADULT MUST ACCOMPANY YOU HOME AND STAY WITH YOU FOR 24 HOURS.  Name and phone number of your driver:                Please read over the following fact sheets you were given: _____________________________________________________________________  [facility] - Preparing for Surgery Before surgery, you can play an important role.  Because skin is not sterile, your skin needs to be as free of germs as possible.  You can reduce the number of germs on your skin by washing with CHG (chlorahexidine gluconate) soap before surgery.  CHG is an antiseptic cleaner which kills germs and bonds with the skin to continue killing germs even after washing. Please DO NOT use if you have an allergy to CHG or antibacterial soaps.  If your skin becomes reddened/irritated stop using the CHG and inform your nurse when you arrive at Short Stay. Do not shave (including legs and underarms) for at least 48 hours prior to the first CHG shower.  You may shave your  face/neck. Please follow these instructions carefully:  1.  Shower with CHG Soap the night before surgery and the  morning of Surgery.  2.  If you choose to wash your hair, wash your hair first as usual with your  normal  shampoo.  3.  After you shampoo, rinse your hair and body thoroughly to remove the  shampoo.                           4.  Use CHG as you would any other liquid soap.  You can apply chg directly  to the skin and wash                       Gently with a scrungie or clean washcloth.  5.  Apply the CHG Soap to your body ONLY FROM THE NECK DOWN.   Do not use on face/ open  Wound or open sores. Avoid contact with eyes, ears mouth and genitals (private parts).                       Wash face,  Genitals (private parts) with your normal soap.             6.  Wash thoroughly, paying special attention to the area where your surgery  will be performed.  7.  Thoroughly rinse your body with warm water from the neck down.  8.  DO NOT shower/wash with your normal soap after using and rinsing off  the CHG Soap.                9.  Pat yourself dry with a clean towel.            10.  Wear clean pajamas.            11.  Place clean sheets on your bed the night of your first shower and do not  sleep with pets. Day of Surgery : Do not apply any lotions/deodorants the morning of surgery.  Please wear clean clothes to the hospital/surgery center.  FAILURE TO FOLLOW THESE INSTRUCTIONS MAY RESULT IN THE CANCELLATION OF YOUR SURGERY PATIENT SIGNATURE_________________________________  NURSE SIGNATURE__________________________________  ________________________________________________________________________

## 2019-08-23 ENCOUNTER — Encounter (HOSPITAL_COMMUNITY): Payer: Self-pay | Admitting: Physician Assistant

## 2019-08-23 ENCOUNTER — Encounter (HOSPITAL_COMMUNITY)
Admission: RE | Admit: 2019-08-23 | Discharge: 2019-08-23 | Disposition: A | Discharge status: Home / Self Care | Payer: Medicare (Managed Care) | Source: Ambulatory Visit | Attending: Orthopedic Surgery | Admitting: Orthopedic Surgery

## 2019-08-23 ENCOUNTER — Other Ambulatory Visit: Payer: Self-pay

## 2019-08-23 ENCOUNTER — Encounter (HOSPITAL_COMMUNITY): Payer: Self-pay

## 2019-08-23 DIAGNOSIS — Z01818 Encounter for other preprocedural examination: Secondary | ICD-10-CM | POA: Diagnosis present

## 2019-08-23 HISTORY — DX: Malignant (primary) neoplasm, unspecified: C80.1

## 2019-08-23 HISTORY — DX: Unspecified osteoarthritis, unspecified site: M19.90

## 2019-08-23 LAB — SURGICAL PCR SCREEN
MRSA, PCR: NEGATIVE
Staphylococcus aureus: NEGATIVE

## 2019-08-23 LAB — BASIC METABOLIC PANEL
Anion gap: 10 (ref 5–15)
BUN: 19 mg/dL (ref 8–23)
CO2: 25 mmol/L (ref 22–32)
Calcium: 9.2 mg/dL (ref 8.9–10.3)
Chloride: 104 mmol/L (ref 98–111)
Creatinine, Ser: 1.05 mg/dL — ABNORMAL HIGH (ref 0.44–1.00)
GFR calc Af Amer: >60 mL/min (ref >60)
GFR calc non Af Amer: 55 mL/min — ABNORMAL LOW (ref >60)
Glucose, Bld: 177 mg/dL — ABNORMAL HIGH (ref 70–99)
Potassium: 3.1 mmol/L — ABNORMAL LOW (ref 3.5–5.1)
Sodium: 139 mmol/L (ref 135–145)

## 2019-08-23 LAB — CBC
HCT: 39.4 % (ref 36.0–46.0)
Hemoglobin: 12.4 g/dL (ref 12.0–15.0)
MCH: 28.8 pg (ref 26.0–34.0)
MCHC: 31.5 g/dL (ref 30.0–36.0)
MCV: 91.6 fL (ref 80.0–100.0)
Platelets: 366 10*3/uL (ref 150–400)
RBC: 4.3 MIL/uL (ref 3.87–5.11)
RDW: 14.8 % (ref 11.5–15.5)
WBC: 7.6 10*3/uL (ref 4.0–10.5)
nRBC: 0 % (ref 0.0–0.2)

## 2019-08-23 LAB — RAPID URINE DRUG SCREEN, HOSP PERFORMED
Amphetamines: NOT DETECTED
Barbiturates: NOT DETECTED
Benzodiazepines: NOT DETECTED
Cocaine: POSITIVE — AB
Opiates: NOT DETECTED
Tetrahydrocannabinol: NOT DETECTED

## 2019-08-23 LAB — GLUCOSE, CAPILLARY: Glucose-Capillary: 176 mg/dL — ABNORMAL HIGH (ref 70–99)

## 2019-08-23 LAB — HEMOGLOBIN A1C
Hgb A1c MFr Bld: 7.6 % — ABNORMAL HIGH (ref 4.8–5.6)
Mean Plasma Glucose: 171.42 mg/dL

## 2019-08-23 NOTE — Progress Notes (Signed)
Konrad Felix, PAC made aware of urine rapid drug screen done on 08/23/2019 as soon as results received.

## 2019-08-23 NOTE — Progress Notes (Signed)
HGBA1C done 08/23/19 routed via epic to DR Mardelle Matte.

## 2019-08-23 NOTE — Progress Notes (Signed)
Urine rapid drug screen and BMP faxed via epic to DR Mardelle Matte.

## 2019-08-26 NOTE — Progress Notes (Signed)
Final EKG done 08/23/19 in epic.

## 2019-08-30 ENCOUNTER — Other Ambulatory Visit (HOSPITAL_COMMUNITY): Payer: Medicare (Managed Care)

## 2019-09-03 ENCOUNTER — Ambulatory Visit (HOSPITAL_COMMUNITY)
Admission: RE | Admit: 2019-09-03 | Payer: Medicare (Managed Care) | Source: Home / Self Care | Admitting: Orthopedic Surgery

## 2019-09-03 ENCOUNTER — Encounter (HOSPITAL_COMMUNITY): Admission: RE | Payer: Self-pay | Source: Home / Self Care

## 2019-09-03 DIAGNOSIS — M1611 Unilateral primary osteoarthritis, right hip: Secondary | ICD-10-CM | POA: Diagnosis present

## 2019-09-03 SURGERY — ARTHROPLASTY, HIP, TOTAL,POSTERIOR APPROACH
Anesthesia: Choice | Site: Hip | Laterality: Right

## 2020-01-14 ENCOUNTER — Other Ambulatory Visit: Payer: Self-pay | Admitting: Pain Medicine

## 2020-01-14 DIAGNOSIS — G8929 Other chronic pain: Secondary | ICD-10-CM

## 2020-01-14 DIAGNOSIS — M7918 Myalgia, other site: Secondary | ICD-10-CM

## 2022-12-25 ENCOUNTER — Encounter: Payer: Self-pay | Admitting: Emergency Medicine

## 2022-12-25 ENCOUNTER — Emergency Department: Payer: Medicare HMO

## 2022-12-25 ENCOUNTER — Emergency Department
Admission: EM | Admit: 2022-12-25 | Discharge: 2022-12-25 | Disposition: A | Discharge status: Home / Self Care | Payer: Medicare HMO | Attending: Emergency Medicine | Admitting: Emergency Medicine

## 2022-12-25 ENCOUNTER — Other Ambulatory Visit: Payer: Self-pay

## 2022-12-25 DIAGNOSIS — Y92410 Unspecified street and highway as the place of occurrence of the external cause: Secondary | ICD-10-CM | POA: Insufficient documentation

## 2022-12-25 DIAGNOSIS — S199XXA Unspecified injury of neck, initial encounter: Secondary | ICD-10-CM | POA: Diagnosis present

## 2022-12-25 DIAGNOSIS — M545 Low back pain, unspecified: Secondary | ICD-10-CM | POA: Insufficient documentation

## 2022-12-25 DIAGNOSIS — R519 Headache, unspecified: Secondary | ICD-10-CM | POA: Diagnosis not present

## 2022-12-25 DIAGNOSIS — S161XXA Strain of muscle, fascia and tendon at neck level, initial encounter: Secondary | ICD-10-CM | POA: Diagnosis not present

## 2022-12-25 MED ORDER — OXYCODONE-ACETAMINOPHEN 5-325 MG PO TABS: 1.0000 | ORAL_TABLET | ORAL | 0 refills | Status: AC | PRN
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
## 2022-12-25 NOTE — Discharge Instructions (Addendum)
Continue taking ibuprofen as well as the muscle relaxer.  You can take the oxycodone prescribed today as needed for breakthrough pain.  Follow-up with your regular doctor.  Return to the ER for any new, worsening, or persistent severe pain, numbness or weakness, headache, dizziness, or any other new or worsening symptoms that concern you.
